# Patient Record
Sex: Female | Born: 1935 | Race: White | Hispanic: No | Marital: Single | Smoking: Current some day smoker
Health system: Southern US, Community
[De-identification: ages and names within clinical notes are randomized; demographics above are authoritative.]

## PROBLEM LIST (undated history)

## (undated) DIAGNOSIS — C801 Malignant (primary) neoplasm, unspecified: Secondary | ICD-10-CM

## (undated) DIAGNOSIS — I739 Peripheral vascular disease, unspecified: Secondary | ICD-10-CM

## (undated) DIAGNOSIS — I6529 Occlusion and stenosis of unspecified carotid artery: Secondary | ICD-10-CM

## (undated) DIAGNOSIS — F32A Depression, unspecified: Secondary | ICD-10-CM

## (undated) DIAGNOSIS — K219 Gastro-esophageal reflux disease without esophagitis: Secondary | ICD-10-CM

## (undated) DIAGNOSIS — G4700 Insomnia, unspecified: Secondary | ICD-10-CM

## (undated) DIAGNOSIS — K2270 Barrett's esophagus without dysplasia: Secondary | ICD-10-CM

## (undated) DIAGNOSIS — F17200 Nicotine dependence, unspecified, uncomplicated: Secondary | ICD-10-CM

## (undated) DIAGNOSIS — M25559 Pain in unspecified hip: Secondary | ICD-10-CM

## (undated) DIAGNOSIS — I2510 Atherosclerotic heart disease of native coronary artery without angina pectoris: Secondary | ICD-10-CM

## (undated) DIAGNOSIS — M545 Low back pain, unspecified: Secondary | ICD-10-CM

## (undated) DIAGNOSIS — I10 Essential (primary) hypertension: Secondary | ICD-10-CM

## (undated) DIAGNOSIS — Z8719 Personal history of other diseases of the digestive system: Secondary | ICD-10-CM

## (undated) DIAGNOSIS — E538 Deficiency of other specified B group vitamins: Secondary | ICD-10-CM

## (undated) DIAGNOSIS — E559 Vitamin D deficiency, unspecified: Secondary | ICD-10-CM

## (undated) DIAGNOSIS — I4891 Unspecified atrial fibrillation: Secondary | ICD-10-CM

## (undated) DIAGNOSIS — R059 Cough, unspecified: Secondary | ICD-10-CM

## (undated) DIAGNOSIS — IMO0001 Reserved for inherently not codable concepts without codable children: Secondary | ICD-10-CM

## (undated) DIAGNOSIS — M8580 Other specified disorders of bone density and structure, unspecified site: Secondary | ICD-10-CM

## (undated) DIAGNOSIS — E785 Hyperlipidemia, unspecified: Secondary | ICD-10-CM

## (undated) DIAGNOSIS — J449 Chronic obstructive pulmonary disease, unspecified: Secondary | ICD-10-CM

## (undated) DIAGNOSIS — F329 Major depressive disorder, single episode, unspecified: Secondary | ICD-10-CM

## (undated) DIAGNOSIS — F419 Anxiety disorder, unspecified: Secondary | ICD-10-CM

## (undated) DIAGNOSIS — I6509 Occlusion and stenosis of unspecified vertebral artery: Secondary | ICD-10-CM

## (undated) DIAGNOSIS — R05 Cough: Secondary | ICD-10-CM

## (undated) DIAGNOSIS — R011 Cardiac murmur, unspecified: Secondary | ICD-10-CM

## (undated) DIAGNOSIS — M797 Fibromyalgia: Secondary | ICD-10-CM

## (undated) DIAGNOSIS — M1990 Unspecified osteoarthritis, unspecified site: Secondary | ICD-10-CM

## (undated) DIAGNOSIS — N200 Calculus of kidney: Secondary | ICD-10-CM

## (undated) HISTORY — DX: Unspecified osteoarthritis, unspecified site: M19.90

## (undated) HISTORY — DX: Chronic obstructive pulmonary disease, unspecified: J44.9

## (undated) HISTORY — DX: Gastro-esophageal reflux disease without esophagitis: K21.9

## (undated) HISTORY — DX: Atherosclerotic heart disease of native coronary artery without angina pectoris: I25.10

## (undated) HISTORY — DX: Cough, unspecified: R05.9

## (undated) HISTORY — DX: Vitamin D deficiency, unspecified: E55.9

## (undated) HISTORY — DX: Pain in unspecified hip: M25.559

## (undated) HISTORY — DX: Malignant (primary) neoplasm, unspecified: C80.1

## (undated) HISTORY — DX: Cough: R05

## (undated) HISTORY — DX: Deficiency of other specified B group vitamins: E53.8

## (undated) HISTORY — DX: Nicotine dependence, unspecified, uncomplicated: F17.200

## (undated) HISTORY — DX: Cardiac murmur, unspecified: R01.1

## (undated) HISTORY — DX: Other specified disorders of bone density and structure, unspecified site: M85.80

## (undated) HISTORY — DX: Occlusion and stenosis of unspecified vertebral artery: I65.09

## (undated) HISTORY — DX: Unspecified atrial fibrillation: I48.91

## (undated) HISTORY — DX: Occlusion and stenosis of unspecified carotid artery: I65.29

## (undated) HISTORY — DX: Low back pain: M54.5

## (undated) HISTORY — PX: COLONOSCOPY: SHX174

## (undated) HISTORY — DX: Peripheral vascular disease, unspecified: I73.9

## (undated) HISTORY — DX: Reserved for inherently not codable concepts without codable children: IMO0001

## (undated) HISTORY — DX: Low back pain, unspecified: M54.50

## (undated) HISTORY — DX: Insomnia, unspecified: G47.00

## (undated) HISTORY — DX: Barrett's esophagus without dysplasia: K22.70

## (undated) HISTORY — DX: Hyperlipidemia, unspecified: E78.5

## (undated) HISTORY — DX: Essential (primary) hypertension: I10

---

## 1984-05-17 HISTORY — PX: ABDOMINAL HYSTERECTOMY: SHX81

## 1997-08-20 ENCOUNTER — Ambulatory Visit (HOSPITAL_COMMUNITY): Admission: RE | Admit: 1997-08-20 | Discharge: 1997-08-20 | Payer: Self-pay | Admitting: Internal Medicine

## 1999-04-03 ENCOUNTER — Other Ambulatory Visit: Admission: RE | Admit: 1999-04-03 | Discharge: 1999-04-03 | Payer: Self-pay | Admitting: *Deleted

## 1999-08-11 ENCOUNTER — Ambulatory Visit (HOSPITAL_BASED_OUTPATIENT_CLINIC_OR_DEPARTMENT_OTHER): Admission: RE | Admit: 1999-08-11 | Discharge: 1999-08-11 | Payer: Self-pay | Admitting: *Deleted

## 1999-08-11 ENCOUNTER — Encounter (INDEPENDENT_AMBULATORY_CARE_PROVIDER_SITE_OTHER): Payer: Self-pay | Admitting: *Deleted

## 2000-09-20 ENCOUNTER — Other Ambulatory Visit: Admission: RE | Admit: 2000-09-20 | Discharge: 2000-09-20 | Payer: Self-pay | Admitting: Obstetrics & Gynecology

## 2002-10-09 ENCOUNTER — Other Ambulatory Visit: Admission: RE | Admit: 2002-10-09 | Discharge: 2002-10-09 | Payer: Self-pay | Admitting: Obstetrics & Gynecology

## 2002-10-21 ENCOUNTER — Encounter: Payer: Self-pay | Admitting: Orthopaedic Surgery

## 2002-10-21 ENCOUNTER — Encounter: Admission: RE | Admit: 2002-10-21 | Discharge: 2002-10-21 | Payer: Self-pay | Admitting: Orthopaedic Surgery

## 2006-04-04 ENCOUNTER — Ambulatory Visit: Payer: Self-pay | Admitting: Gastroenterology

## 2006-04-05 ENCOUNTER — Ambulatory Visit: Payer: Self-pay | Admitting: Internal Medicine

## 2006-04-20 ENCOUNTER — Encounter (INDEPENDENT_AMBULATORY_CARE_PROVIDER_SITE_OTHER): Payer: Self-pay | Admitting: *Deleted

## 2006-04-20 ENCOUNTER — Ambulatory Visit: Payer: Self-pay | Admitting: Gastroenterology

## 2006-05-13 ENCOUNTER — Ambulatory Visit: Payer: Self-pay | Admitting: Internal Medicine

## 2006-08-25 ENCOUNTER — Ambulatory Visit: Payer: Self-pay | Admitting: Internal Medicine

## 2006-09-20 ENCOUNTER — Ambulatory Visit: Payer: Self-pay | Admitting: Internal Medicine

## 2006-09-30 ENCOUNTER — Ambulatory Visit: Payer: Self-pay | Admitting: Internal Medicine

## 2006-09-30 LAB — CONVERTED CEMR LAB
Basophils Relative: 0.2 % (ref 0.0–1.0)
Bilirubin Urine: NEGATIVE
Bilirubin, Direct: 0.1 mg/dL (ref 0.0–0.3)
CO2: 29 meq/L (ref 19–32)
Direct LDL: 138.1 mg/dL
Eosinophils Relative: 1.3 % (ref 0.0–5.0)
GFR calc Af Amer: 80 mL/min
Glucose, Bld: 108 mg/dL — ABNORMAL HIGH (ref 70–99)
HDL: 40.5 mg/dL (ref >39.0)
Hemoglobin: 14 g/dL (ref 12.0–15.0)
Leukocytes, UA: NEGATIVE
Lymphocytes Relative: 23.3 % (ref 12.0–46.0)
Monocytes Absolute: 0.6 10*3/uL (ref 0.2–0.7)
Neutro Abs: 4.4 10*3/uL (ref 1.4–7.7)
Neutrophils Relative %: 66.7 % (ref 43.0–77.0)
Nitrite: NEGATIVE
Potassium: 4.6 meq/L (ref 3.5–5.1)
Specific Gravity, Urine: 1.02 (ref 1.000–1.03)
Total Protein, Urine: NEGATIVE mg/dL
Total Protein: 6.9 g/dL (ref 6.0–8.3)
Vitamin B-12: 198 pg/mL — ABNORMAL LOW (ref 211–911)
WBC: 6.7 10*3/uL (ref 4.5–10.5)

## 2006-10-05 ENCOUNTER — Ambulatory Visit: Payer: Self-pay | Admitting: Cardiology

## 2006-10-31 ENCOUNTER — Ambulatory Visit: Payer: Self-pay | Admitting: Internal Medicine

## 2006-12-09 ENCOUNTER — Ambulatory Visit: Payer: Self-pay | Admitting: Cardiovascular Disease

## 2006-12-23 ENCOUNTER — Encounter: Payer: Self-pay | Admitting: Cardiovascular Disease

## 2006-12-23 ENCOUNTER — Ambulatory Visit: Payer: Self-pay

## 2007-01-05 ENCOUNTER — Ambulatory Visit: Payer: Self-pay | Admitting: Internal Medicine

## 2007-01-05 ENCOUNTER — Encounter: Payer: Self-pay | Admitting: Internal Medicine

## 2007-01-05 DIAGNOSIS — M545 Low back pain: Secondary | ICD-10-CM

## 2007-01-05 DIAGNOSIS — Z720 Tobacco use: Secondary | ICD-10-CM

## 2007-01-05 DIAGNOSIS — M949 Disorder of cartilage, unspecified: Secondary | ICD-10-CM

## 2007-01-05 DIAGNOSIS — M899 Disorder of bone, unspecified: Secondary | ICD-10-CM

## 2007-01-05 DIAGNOSIS — F17210 Nicotine dependence, cigarettes, uncomplicated: Secondary | ICD-10-CM | POA: Insufficient documentation

## 2007-01-05 DIAGNOSIS — E538 Deficiency of other specified B group vitamins: Secondary | ICD-10-CM | POA: Insufficient documentation

## 2007-01-05 DIAGNOSIS — K2270 Barrett's esophagus without dysplasia: Secondary | ICD-10-CM

## 2007-01-05 DIAGNOSIS — I2510 Atherosclerotic heart disease of native coronary artery without angina pectoris: Secondary | ICD-10-CM | POA: Insufficient documentation

## 2007-01-05 DIAGNOSIS — I6509 Occlusion and stenosis of unspecified vertebral artery: Secondary | ICD-10-CM | POA: Insufficient documentation

## 2007-01-05 DIAGNOSIS — E559 Vitamin D deficiency, unspecified: Secondary | ICD-10-CM

## 2007-01-05 DIAGNOSIS — E785 Hyperlipidemia, unspecified: Secondary | ICD-10-CM

## 2007-01-05 DIAGNOSIS — M1990 Unspecified osteoarthritis, unspecified site: Secondary | ICD-10-CM

## 2007-04-10 ENCOUNTER — Ambulatory Visit: Payer: Self-pay | Admitting: Internal Medicine

## 2007-04-10 DIAGNOSIS — I739 Peripheral vascular disease, unspecified: Secondary | ICD-10-CM

## 2007-04-10 DIAGNOSIS — K219 Gastro-esophageal reflux disease without esophagitis: Secondary | ICD-10-CM

## 2007-07-24 ENCOUNTER — Encounter: Payer: Self-pay | Admitting: Internal Medicine

## 2007-12-12 ENCOUNTER — Encounter: Payer: Self-pay | Admitting: Internal Medicine

## 2007-12-13 ENCOUNTER — Ambulatory Visit: Payer: Self-pay | Admitting: Internal Medicine

## 2007-12-13 DIAGNOSIS — I10 Essential (primary) hypertension: Secondary | ICD-10-CM | POA: Insufficient documentation

## 2007-12-13 DIAGNOSIS — IMO0001 Reserved for inherently not codable concepts without codable children: Secondary | ICD-10-CM

## 2008-01-04 ENCOUNTER — Ambulatory Visit: Payer: Self-pay

## 2008-01-04 ENCOUNTER — Ambulatory Visit: Payer: Self-pay | Admitting: Cardiovascular Disease

## 2008-03-12 ENCOUNTER — Ambulatory Visit: Payer: Self-pay | Admitting: Internal Medicine

## 2008-03-12 LAB — CONVERTED CEMR LAB
ALT: 14 units/L (ref 0–35)
Bilirubin, Direct: 0.1 mg/dL (ref 0.0–0.3)
CO2: 30 meq/L (ref 19–32)
Calcium: 9 mg/dL (ref 8.4–10.5)
Chloride: 105 meq/L (ref 96–112)
GFR calc non Af Amer: 58 mL/min
Sodium: 143 meq/L (ref 135–145)
TSH: 2.71 microintl units/mL (ref 0.35–5.50)
Total Bilirubin: 0.5 mg/dL (ref 0.3–1.2)
Total CHOL/HDL Ratio: 5.5
Triglycerides: 253 mg/dL (ref 0–149)

## 2008-03-17 ENCOUNTER — Encounter: Payer: Self-pay | Admitting: Internal Medicine

## 2008-03-18 ENCOUNTER — Ambulatory Visit: Payer: Self-pay | Admitting: Internal Medicine

## 2008-03-18 DIAGNOSIS — R05 Cough: Secondary | ICD-10-CM

## 2008-03-20 ENCOUNTER — Telehealth: Payer: Self-pay | Admitting: Internal Medicine

## 2008-07-11 ENCOUNTER — Ambulatory Visit: Payer: Self-pay | Admitting: Internal Medicine

## 2008-07-11 LAB — CONVERTED CEMR LAB
ALT: 15 U/L
AST: 21 U/L
Albumin: 3.7 g/dL
Alkaline Phosphatase: 74 U/L
BUN: 15 mg/dL
Bilirubin, Direct: 0.1 mg/dL
CO2: 30 meq/L
Calcium: 9.4 mg/dL
Chloride: 104 meq/L
Cholesterol: 233 mg/dL
Creatinine, Ser: 1.1 mg/dL
Direct LDL: 152.7 mg/dL
GFR calc Af Amer: 63 mL/min
GFR calc non Af Amer: 52 mL/min
Glucose, Bld: 117 mg/dL — ABNORMAL HIGH
HDL: 43.7 mg/dL
Potassium: 4.2 meq/L
Sodium: 141 meq/L
Total Bilirubin: 0.7 mg/dL
Total CHOL/HDL Ratio: 5.3
Total Protein: 6.6 g/dL
Triglycerides: 200 mg/dL — ABNORMAL HIGH
VLDL: 40 mg/dL

## 2008-07-16 ENCOUNTER — Ambulatory Visit: Payer: Self-pay | Admitting: Internal Medicine

## 2008-10-22 ENCOUNTER — Ambulatory Visit: Payer: Self-pay | Admitting: Internal Medicine

## 2008-10-22 LAB — CONVERTED CEMR LAB
Albumin: 3.8 g/dL (ref 3.5–5.2)
Chloride: 112 meq/L (ref 96–112)
Cholesterol: 250 mg/dL — ABNORMAL HIGH (ref 0–200)
Direct LDL: 182.6 mg/dL
GFR calc non Af Amer: 65.37 mL/min (ref >60)
Potassium: 3.9 meq/L (ref 3.5–5.1)
Sodium: 145 meq/L (ref 135–145)
TSH: 1.6 microintl units/mL (ref 0.35–5.50)
Total CHOL/HDL Ratio: 6
Total Protein: 6.9 g/dL (ref 6.0–8.3)
Triglycerides: 195 mg/dL — ABNORMAL HIGH (ref 0.0–149.0)
VLDL: 39 mg/dL (ref 0.0–40.0)
Vit D, 25-Hydroxy: 31 ng/mL (ref 30–89)

## 2008-10-28 ENCOUNTER — Ambulatory Visit: Payer: Self-pay | Admitting: Internal Medicine

## 2008-10-28 DIAGNOSIS — M25559 Pain in unspecified hip: Secondary | ICD-10-CM

## 2009-01-09 ENCOUNTER — Encounter: Payer: Self-pay | Admitting: Cardiovascular Disease

## 2009-01-09 ENCOUNTER — Ambulatory Visit: Payer: Self-pay

## 2009-02-01 DIAGNOSIS — R011 Cardiac murmur, unspecified: Secondary | ICD-10-CM | POA: Insufficient documentation

## 2009-02-01 DIAGNOSIS — Z9189 Other specified personal risk factors, not elsewhere classified: Secondary | ICD-10-CM | POA: Insufficient documentation

## 2009-02-03 ENCOUNTER — Ambulatory Visit: Payer: Self-pay | Admitting: Internal Medicine

## 2009-02-04 ENCOUNTER — Ambulatory Visit: Payer: Self-pay | Admitting: Cardiovascular Disease

## 2009-02-04 DIAGNOSIS — I4891 Unspecified atrial fibrillation: Secondary | ICD-10-CM | POA: Insufficient documentation

## 2009-02-04 DIAGNOSIS — I6529 Occlusion and stenosis of unspecified carotid artery: Secondary | ICD-10-CM | POA: Insufficient documentation

## 2009-04-28 ENCOUNTER — Ambulatory Visit: Payer: Self-pay | Admitting: Internal Medicine

## 2009-04-29 LAB — CONVERTED CEMR LAB
ALT: 16 units/L (ref 0–35)
BUN: 9 mg/dL (ref 6–23)
Bilirubin, Direct: 0.1 mg/dL (ref 0.0–0.3)
CO2: 30 meq/L (ref 19–32)
Calcium: 9.5 mg/dL (ref 8.4–10.5)
Chloride: 107 meq/L (ref 96–112)
Cholesterol: 292 mg/dL — ABNORMAL HIGH (ref 0–200)
Creatinine, Ser: 1.1 mg/dL (ref 0.4–1.2)
TSH: 2.62 microintl units/mL (ref 0.35–5.50)
Total Bilirubin: 0.7 mg/dL (ref 0.3–1.2)
Total CHOL/HDL Ratio: 7
Total Protein: 7.7 g/dL (ref 6.0–8.3)
Triglycerides: 465 mg/dL — ABNORMAL HIGH (ref 0.0–149.0)

## 2009-05-17 HISTORY — PX: OTHER SURGICAL HISTORY: SHX169

## 2009-05-19 ENCOUNTER — Ambulatory Visit: Payer: Self-pay | Admitting: Internal Medicine

## 2009-05-19 DIAGNOSIS — G4700 Insomnia, unspecified: Secondary | ICD-10-CM | POA: Insufficient documentation

## 2009-05-23 ENCOUNTER — Telehealth: Payer: Self-pay | Admitting: Internal Medicine

## 2009-08-14 ENCOUNTER — Ambulatory Visit: Payer: Self-pay | Admitting: Internal Medicine

## 2009-08-14 LAB — CONVERTED CEMR LAB
ALT: 15 units/L (ref 0–35)
AST: 23 units/L (ref 0–37)
Albumin: 3.9 g/dL (ref 3.5–5.2)
BUN: 10 mg/dL (ref 6–23)
Chloride: 105 meq/L (ref 96–112)
Cholesterol: 194 mg/dL (ref 0–200)
Creatinine, Ser: 1 mg/dL (ref 0.4–1.2)
Glucose, Bld: 108 mg/dL — ABNORMAL HIGH (ref 70–99)
Potassium: 4.2 meq/L (ref 3.5–5.1)
Total CHOL/HDL Ratio: 4
Total Protein: 7.2 g/dL (ref 6.0–8.3)
Triglycerides: 242 mg/dL — ABNORMAL HIGH (ref 0.0–149.0)

## 2009-08-19 ENCOUNTER — Ambulatory Visit: Payer: Self-pay | Admitting: Internal Medicine

## 2010-03-25 ENCOUNTER — Ambulatory Visit: Payer: Self-pay | Admitting: Cardiovascular Disease

## 2010-03-25 ENCOUNTER — Ambulatory Visit: Payer: Self-pay | Admitting: Internal Medicine

## 2010-03-27 LAB — CONVERTED CEMR LAB
AST: 25 units/L (ref 0–37)
Albumin: 3.8 g/dL (ref 3.5–5.2)
BUN: 19 mg/dL (ref 6–23)
Basophils Absolute: 0 10*3/uL (ref 0.0–0.1)
CO2: 28 meq/L (ref 19–32)
Calcium: 9.2 mg/dL (ref 8.4–10.5)
Direct LDL: 159.2 mg/dL
Eosinophils Absolute: 0.1 10*3/uL (ref 0.0–0.7)
GFR calc non Af Amer: 59.72 mL/min (ref >60)
Glucose, Bld: 108 mg/dL — ABNORMAL HIGH (ref 70–99)
HCT: 41.9 % (ref 36.0–46.0)
HDL: 51.1 mg/dL (ref >39.00)
Hemoglobin, Urine: NEGATIVE
Hemoglobin: 14.1 g/dL (ref 12.0–15.0)
Lymphocytes Relative: 22.3 % (ref 12.0–46.0)
Lymphs Abs: 1.8 10*3/uL (ref 0.7–4.0)
MCHC: 33.8 g/dL (ref 30.0–36.0)
Monocytes Relative: 8.4 % (ref 3.0–12.0)
Neutro Abs: 5.4 10*3/uL (ref 1.4–7.7)
Platelets: 216 10*3/uL (ref 150.0–400.0)
RDW: 15.1 % — ABNORMAL HIGH (ref 11.5–14.6)
TSH: 2.52 microintl units/mL (ref 0.35–5.50)
Total Bilirubin: 0.4 mg/dL (ref 0.3–1.2)
Triglycerides: 232 mg/dL — ABNORMAL HIGH (ref 0.0–149.0)
Urine Glucose: NEGATIVE mg/dL
Urobilinogen, UA: 1 (ref 0.0–1.0)
VLDL: 46.4 mg/dL — ABNORMAL HIGH (ref 0.0–40.0)

## 2010-03-30 ENCOUNTER — Ambulatory Visit: Payer: Self-pay | Admitting: Internal Medicine

## 2010-05-17 HISTORY — PX: JOINT REPLACEMENT: SHX530

## 2010-06-16 NOTE — Assessment & Plan Note (Signed)
Summary: 6 MO ROV /NWS  #   Vital Signs:  Patient profile:   76 year old female Height:      64 inches Weight:      182 pounds BMI:     31.35 Temp:     98.4 degrees F oral Pulse rate:   76 / minute Pulse rhythm:   regular Resp:     16 per minute BP sitting:   136 / 78  (left arm) Cuff size:   regular  Vitals Entered By: Lanier Prude, CMA(AAMA) (March 30, 2010 8:07 AM) CC: 6 mo f/u Is Patient Diabetic? No Comments pt is having cataract surgery on Rt eye Monday 04-06-10   CC:  6 mo f/u.  History of Present Illness: The patient presents for a follow up of CAD, PVD, COPD, hypertension, hyperlipidemia   Preventive Screening-Counseling & Management  Caffeine-Diet-Exercise     Does Patient Exercise: yes  Current Medications (verified): 1)  Aspirin 81 Mg Tbec (Aspirin) .... Take One Tablet By Mouth Daily 2)  Vitamin B-12 1000 Mcg  Tabs (Cyanocobalamin) .... 1/2 Once Daily 3)  Vitamin D3 1000 Unit  Tabs (Cholecalciferol) .[name] Kitchen.. 1 Tab By Mouth Once Daily 4)  Amlodipine Besylate 5 Mg  Tabs (Amlodipine Besylate) .[name] Kitchen.. 1 By Mouth Every Day 5)  Zolpidem Tartrate 10 Mg Tabs (Zolpidem Tartrate) .... 1/2-1 Tab At Bedtime As Needed Insomnia 6)  Oscal 500/200 D-3 500-200 Mg-Unit Tabs (Calcium Carbonate-Vitamin D) .[name] Kitchen.. 1  Tab By Mouth Once Daily 7)  Bromday 0.09 % (Daily) Soln (Bromfenac Sodium) .... As  Directed 8)  Prednisolone Acetate 1 % Susp (Prednisolone Acetate) .... As Diretced 9)  Besivance 0.6 % Susp (Besifloxacin Hcl) .... As Directed  Allergies (verified): 1)  ! Pcn 2)  ! 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    Physical Exam  General:  overweight-appearing.   Nose:  External nasal examination shows no deformity or inflammation. Nasal mucosa are pink and moist without lesions or exudates. Mouth:  Oral mucosa and oropharynx without lesions or exudates.  Teeth in good repair. Abdomen:  Bowel sounds positive,abdomen soft and non-tender without masses, organomegaly or hernias noted. Msk:  Lumbar-sacral spine is less tender to palpation over paraspinal muscles and painfull with the ROM R troch major is  not tender Neurologic:  No cranial nerve deficits noted. Station and gait are normal. Plantar reflexes are down-going bilaterally. DTRs are symmetrical throughout. Sensory, motor and coordinative functions appear intact. Skin:  Intact without suspicious lesions or rashes Psych:  Cognition and judgment appear intact. Alert and cooperative with normal attention span and concentration. No apparent delusions, illusions, hallucinations   Impression & Recommendations:  Problem # 1:  HYPERTENSION (ICD-401.9) Assessment Unchanged  Her updated medication list for this problem includes:    Amlodipine Besylate 5 Mg Tabs (Amlodipine besylate) .[name] Kitchen... 1 by mouth every day  BP today:  136/78 Prior BP: 130/80 (03/25/2010)  Labs Reviewed: K+: 3.8 (03/25/2010) Creat: : 1.0 (03/25/2010)   Chol: 252 (03/25/2010)   HDL: 51.10 (03/25/2010)   LDL: DEL (07/11/2008)   TG: 232.0 (03/25/2010)  Problem # 2:  PERIPHERAL VASCULAR DISEASE (ICD-443.9) Assessment: Unchanged On the regimen of medicine(s) reflected in the chart    Problem # 3:  HYPERLIPIDEMIA (ICD-272.4) Assessment: Unchanged  Statin intolerant  Labs Reviewed: SGOT: 25 (03/25/2010)   SGPT: 17 (03/25/2010)   HDL:51.10 (03/25/2010), 52.30 (08/14/2009)  LDL:DEL (07/11/2008), DEL (03/12/2008)  Chol:252 (03/25/2010), 194 (08/14/2009)  Trig:232.0 (03/25/2010), 242.0 (08/14/2009)  Problem # 4:  VITAMIN B12 DEFICIENCY (ICD-266.2) Assessment: Comment Only On the regimen of medicine(s) reflected in the chart    Complete Medication List: 1)  Aspirin 81 Mg Tbec (Aspirin) .... Take one tablet by mouth daily 2)  Vitamin B-12 1000 Mcg Tabs (Cyanocobalamin) .... 1/2 once daily 3)  Vitamin D3 1000 Unit Tabs (Cholecalciferol) .[name] Kitchen.. 1 tab by mouth once daily 4)  Amlodipine Besylate 5 Mg Tabs (Amlodipine besylate) .[name] Kitchen.. 1 by mouth every day 5)  Zolpidem Tartrate 10 Mg Tabs (Zolpidem tartrate) .... 1/2-1 tab at bedtime as needed insomnia 6)  Oscal 500/200 D-3 500-200 Mg-unit Tabs (Calcium carbonate-vitamin d) .[name] Kitchen.. 1  tab by mouth once daily 7)  Bromday 0.09 % (daily) Soln (Bromfenac sodium) .... As  directed 8)  Prednisolone Acetate 1 % Susp (Prednisolone acetate) .... As diretced 9)  Besivance 0.6 % Susp (Besifloxacin hcl) .... As directed  Patient Instructions: 1)  Please schedule a follow-up appointment in 4 months well w/labs. Prescriptions: AMLODIPINE BESYLATE 5 MG  TABS (AMLODIPINE BESYLATE) 1 by mouth every day  #30 x 12   Entered and Authorized by:   Tresa Garter MD   Signed by:   Tresa Garter MD on 03/30/2010   Method used:   Electronically to        [facility]* (retail)       [address]       [place], [place]  [id number]       Ph: [phone]       Fax: [phone]   RxID:   [id number]    Orders Added: 1)  Est. Patient Level IV [25366]   Immunization History:  Influenza Immunization History:    Influenza:  historical (03/06/2010)   Immunization History:  Influenza Immunization History:    Influenza:  Historical (03/06/2010)

## 2010-06-16 NOTE — Assessment & Plan Note (Signed)
Summary: 3 MOS F/U # CD   Vital Signs:  Patient profile:   76 year old female Weight:      189 pounds Temp:     98.1 degrees F oral Pulse rate:   75 / minute BP sitting:   134 / 86  (left arm)  Vitals Entered By: Tora Perches (May 19, 2009 3:28 PM) CC: f/u Is Patient Diabetic? No   CC:  f/u.  History of Present Illness: The patient presents for a follow up of hypertension, OA, hyperlipidemia. C/o insomnia   Current Medications (verified): 1)  Cyclobenzaprine Hcl 5 Mg Tabs (Cyclobenzaprine Hcl) .[name] Kitchen.. 1 By Mouth Three Times A Day Prn 2)  Vesicare 10 Mg  Tabs (Solifenacin Succinate) .... Once Daily  As Needed 3)  Potassium Chloride Cr 10 Meq  Tbcr (Potassium Chloride) .[name] Kitchen.. 1 By Mouth Qd 4)  Aspirin 81 Mg Tbec (Aspirin) .... Take One Tablet By Mouth Daily 5)  Vitamin B-12 1000 Mcg  Tabs (Cyanocobalamin) .... 1/2 Once Daily 6)  Vitamin D3 1000 Unit  Tabs (Cholecalciferol) .[name] Kitchen.. 1 Tab By Mouth Once Daily 7)  Amlodipine Besylate 5 Mg  Tabs (Amlodipine Besylate) .[name] Kitchen.. 1 By Mouth Every Day 8)  Vitamin C 1000 Mg Tabs (Ascorbic Acid) .... Tab By Mouth Once Daily  Allergies: 1)  ! Pcn 2)  ! 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  The patient denies chest pain, syncope, and dyspnea on exertion.    Physical Exam  General:  overweight-appearing.   Nose:  External nasal examination shows no deformity or inflammation. Nasal mucosa are pink and moist without lesions or exudates. Mouth:  Oral mucosa and oropharynx without lesions or exudates.  Teeth in good repair. Lungs:  Normal respiratory effort, chest expands symmetrically. Lungs are clear to auscultation, no crackles or wheezes. Heart:  Normal rate and regular rhythm. S1 and S2 normal without gallop, murmur, click, rub or other extra sounds. Abdomen:  Bowel sounds positive,abdomen soft and non-tender without masses, organomegaly or hernias noted. Msk:  Lumbar-sacral spine is less tender to palpation over paraspinal muscles and painfull with the ROM R troch major is  not tender Neurologic:  No cranial nerve deficits noted. Station and gait are normal. Plantar reflexes are down-going bilaterally. DTRs are symmetrical throughout. Sensory, motor and coordinative functions appear intact. Skin:  Intact without suspicious lesions or rashes Psych:  Cognition and judgment appear intact. Alert and cooperative with normal attention span and concentration. No apparent delusions, illusions, hallucinations   Impression & Recommendations:  Problem # 1:  HYPERTENSION (ICD-401.9) Assessment Unchanged  Her updated medication list for this problem includes:    Amlodipine Besylate 5 Mg Tabs (Amlodipine besylate) .[name] Kitchen... 1 by mouth every day  Problem # 2:  VITAMIN D DEFICIENCY (ICD-268.9) Assessment: Unchanged  Problem # 3:  HYPERLIPIDEMIA (ICD-272.4) Assessment: Deteriorated  Her updated medication list for this problem includes:    Crestor 10 Mg Tabs (Rosuvastatin calcium) .[name] Kitchen... 1 by mouth once daily for cholesterol  Problem #  4:  LOW BACK PAIN (ICD-724.2) Assessment: Unchanged  Her updated medication list for this problem includes:    Cyclobenzaprine Hcl 5 Mg Tabs (Cyclobenzaprine hcl) .[name] Kitchen... 1 by mouth three times a day prn    Aspirin 81 Mg Tbec (Aspirin) .[name] Kitchen... Take one tablet by mouth daily  Problem # 5:  CORONARY ARTERY DISEASE (ICD-414.00) Assessment: Unchanged  Her updated medication list for this problem includes:    Aspirin 81 Mg Tbec (Aspirin) .[name] Kitchen... Take one tablet by mouth daily    Amlodipine Besylate 5 Mg Tabs (Amlodipine besylate) .[name] Kitchen... 1 by mouth every day  Problem # 6:  INSOMNIA, CHRONIC (ICD-307.42) Assessment: Deteriorated Zolpidem prn  Complete Medication List: 1)  Cyclobenzaprine Hcl 5 Mg Tabs (Cyclobenzaprine hcl) .[name] Kitchen.. 1 by mouth three times a day prn 2)  Vesicare 10 Mg Tabs (Solifenacin succinate) .... Once daily  as needed 3)  Potassium Chloride Cr 10 Meq Tbcr (Potassium chloride) .[name] Kitchen.. 1 by mouth qd 4)  Aspirin 81 Mg Tbec (Aspirin) .... Take one tablet by mouth daily 5)  Vitamin B-12 1000 Mcg Tabs (Cyanocobalamin) .... 1/2 once daily 6)  Vitamin D3 1000 Unit Tabs (Cholecalciferol) .[name] Kitchen.. 1 tab by mouth once daily 7)  Amlodipine Besylate 5 Mg Tabs (Amlodipine besylate) .[name] Kitchen.. 1 by mouth every day 8)  Zolpidem Tartrate 10 Mg Tabs (Zolpidem tartrate) .... 1/2-1 tab at bedtime as needed insomnia 9)  Crestor 10 Mg Tabs (Rosuvastatin calcium) .[name] Kitchen.. 1 by mouth once daily for cholesterol  Other Orders: Flu Vaccine 78yrs + (04540) Administration Flu vaccine - MCR ([id number])  Patient Instructions: 1)  Try Crestor 1 every other day  2)  BMP prior to visit, ICD-9: 3)  Hepatic Panel prior to visit, ICD-9:272.0 4)  Vit B12 266.8 5)  Lipid Panel prior to visit, ICD-9: 6)  Please schedule a follow-up appointment in 3 months. Prescriptions: CRESTOR 10 MG TABS (ROSUVASTATIN CALCIUM) 1 by mouth once daily for cholesterol  #30 x 12   Entered and Authorized by:   Tresa Garter MD   Signed by:    Tresa Garter MD on 05/19/2009   Method used:   Print then Give to Patient   RxID:   [id number] ZOLPIDEM TARTRATE 10 MG TABS (ZOLPIDEM TARTRATE) 1/2-1 tab at bedtime as needed insomnia  #30 x 6   Entered and Authorized by:   Tresa Garter MD   Signed by:   Tresa Garter MD on 05/19/2009   Method used:   Print then Give to Patient   RxID:   [id number]    Influenza Vaccine (to be given today)    Flu Vaccine Consent Questions     Do you have a history of severe allergic reactions to this vaccine? no    Any prior history of allergic reactions to egg and/or gelatin? no    Do you have a sensitivity to the preservative Thimersol? no    Do you have a past history of Guillan-Barre Syndrome? no    Do you currently have an acute febrile illness? no    Have you ever had a severe reaction to latex? no    Vaccine information given and explained to patient? yes    Are you currently pregnant? no    Lot Number:[id number]   Exp Date:11/13/2009   Site  Given  right Deltoid IMdflu

## 2010-06-16 NOTE — Assessment & Plan Note (Signed)
Summary: 3 MO ROV /NWS  #   Vital Signs:  Patient profile:   76 year old female Height:      64 inches Weight:      191.50 pounds BMI:     32.99 O2 Sat:      95 % on Room air Temp:     97.1 degrees F oral Pulse rate:   76 / minute BP sitting:   150 / 78  (left arm) Cuff size:   regular  Vitals Entered By: Lucious Groves (August 19, 2009 1:42 PM)  O2 Flow:  Room air CC: 3 mo. rtn ov--Pt states that she still has random buzzing in her ears. Per the pt this started long time ago--possibly 2 yrs ago./kb   CC:  3 mo. rtn ov--Pt states that she still has random buzzing in her ears. Per the pt this started long time ago--possibly 2 yrs ago./kb.  History of Present Illness: The patient presents for a follow up of hypertension, CAD, PVD, hyperlipidemia She stopped Crestor due to ear buzzing - it helped; then she restarted Crestor every other day    Current Medications (verified): 1)  Cyclobenzaprine Hcl 5 Mg Tabs (Cyclobenzaprine Hcl) .[name] Kitchen.. 1 By Mouth Three Times A Day Prn 2)  Vesicare 10 Mg  Tabs (Solifenacin Succinate) .... Once Daily  As Needed 3)  Potassium Chloride Cr 10 Meq  Tbcr (Potassium Chloride) .[name] Kitchen.. 1 By Mouth Qd 4)  Aspirin 81 Mg Tbec (Aspirin) .... Take One Tablet By Mouth Daily 5)  Vitamin B-12 1000 Mcg  Tabs (Cyanocobalamin) .... 1/2 Once Daily 6)  Vitamin D3 1000 Unit  Tabs (Cholecalciferol) .[name] Kitchen.. 1 Tab By Mouth Once Daily 7)  Amlodipine Besylate 5 Mg  Tabs (Amlodipine Besylate) .[name] Kitchen.. 1 By Mouth Every Day 8)  Zolpidem Tartrate 10 Mg Tabs (Zolpidem Tartrate) .... 1/2-1 Tab At Bedtime As Needed Insomnia 9)  Crestor 10 Mg Tabs (Rosuvastatin Calcium) .[name] Kitchen.. 1 By Mouth Once Daily For Cholesterol  Allergies (verified): 1)  ! Pcn 2)  ! 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  The patient denies fever, chest pain, syncope, dyspnea on exertion, and abdominal pain.         Buzzing in ears  Physical Exam  General:  overweight-appearing.   Nose:  External nasal examination shows no deformity or inflammation. Nasal mucosa are pink and moist without lesions or exudates. Mouth:  Oral mucosa and oropharynx without lesions or exudates.  Teeth in good repair. Lungs:  Normal respiratory effort, chest expands symmetrically. Lungs are clear to auscultation, no crackles or wheezes. Heart:  Normal rate and regular rhythm. S1 and S2 normal without gallop, murmur, click, rub or other extra sounds. Abdomen:  Bowel sounds positive,abdomen soft and non-tender without masses, organomegaly or hernias  noted. Msk:  Lumbar-sacral spine is less tender to palpation over paraspinal muscles and painfull with the ROM R troch major is  not tender Neurologic:  No cranial nerve deficits noted. Station and gait are normal. Plantar reflexes are down-going bilaterally. DTRs are symmetrical throughout. Sensory, motor and coordinative functions appear intact. Skin:  Intact without suspicious lesions or rashes Psych:  Cognition and judgment appear intact. Alert and cooperative  with normal attention span and concentration. No apparent delusions, illusions, hallucinations   Impression & Recommendations:  Problem # 1:  HYPERTENSION (ICD-401.9) Assessment Deteriorated  Her updated medication list for this problem includes:    Amlodipine Besylate 5 Mg Tabs (Amlodipine besylate) .[name] Kitchen... 1 by mouth every day  Problem # 2:  MYALGIA (ICD-729.1) Assessment: Improved  Her updated medication list for this problem includes:    Cyclobenzaprine Hcl 5 Mg Tabs (Cyclobenzaprine hcl) .[name] Kitchen... 1 by mouth three times a day prn    Aspirin 81 Mg Tbec (Aspirin) .[name] Kitchen... Take one tablet by mouth daily  Problem # 3:  HYPERLIPIDEMIA (ICD-272.4) Assessment: Improved  Her updated medication list for this problem includes:    Crestor 10 Mg Tabs (Rosuvastatin calcium) .[name] Kitchen... 1 by mouth once daily for cholesterol  Labs Reviewed: SGOT: 23 (08/14/2009)   SGPT: 15 (08/14/2009)   HDL:52.30 (08/14/2009), 40.40 (04/28/2009)  LDL:DEL (07/11/2008), DEL (03/12/2008)  Chol:194 (08/14/2009), 292 (04/28/2009)  Trig:242.0 (08/14/2009), 465.0 (04/28/2009) - the best we can do!  Problem # 4:  CAROTID ARTERY DISEASE (ICD-433.10) Assessment: Unchanged  Her updated medication list for this problem includes:    Aspirin 81 Mg Tbec (Aspirin) .[name] Kitchen... Take one tablet by mouth daily  Problem # 5:  TOBACCO USER (ICD-305.1) Assessment: Unchanged  Encouraged smoking cessation and discussed different methods for smoking cessation.   Complete Medication List: 1)  Cyclobenzaprine Hcl 5 Mg Tabs (Cyclobenzaprine hcl) .[name] Kitchen.. 1 by mouth three times a day prn 2)  Vesicare 10 Mg Tabs (Solifenacin succinate) .... Once daily  as needed 3)  Potassium Chloride Cr 10 Meq Tbcr (Potassium chloride) .[name] Kitchen.. 1 by mouth qd 4)  Aspirin 81 Mg Tbec (Aspirin) .... Take one tablet by mouth daily 5)  Vitamin B-12 1000 Mcg Tabs (Cyanocobalamin) .... 1/2 once daily 6)  Vitamin D3 1000 Unit Tabs (Cholecalciferol) .[name] Kitchen.. 1 tab by mouth once  daily 7)  Amlodipine Besylate 5 Mg Tabs (Amlodipine besylate) .[name] Kitchen.. 1 by mouth every day 8)  Zolpidem Tartrate 10 Mg Tabs (Zolpidem tartrate) .... 1/2-1 tab at bedtime as needed insomnia 9)  Crestor 10 Mg Tabs (Rosuvastatin calcium) .[name] Kitchen.. 1 by mouth once daily for cholesterol  Patient Instructions: 1)  Please schedule a follow-up appointment in 6 months well w/lab.

## 2010-06-16 NOTE — Assessment & Plan Note (Signed)
Summary: f1y  Medications Added OSCAL 500/200 D-3 500-200 MG-UNIT TABS (CALCIUM CARBONATE-VITAMIN D) 1  tab by mouth once daily BROMDAY 0.09 % (DAILY) SOLN (BROMFENAC SODIUM) as  directed PREDNISOLONE ACETATE 1 % SUSP (PREDNISOLONE ACETATE) as diretced BESIVANCE 0.6 % SUSP (BESIFLOXACIN HCL) as directed      Allergies Added:   CC:  check up.  History of Present Illness: Winnell is seen today for F/U of HTN, PAF and carotid disease.  I reviewed her duplex from last week and she had plaque but only 0-39% stenosis.  ASA therapy only is indicated.  She has not had any TIA like symptoms.  She had a lot of questions about degree of stensosis requiring surgery etc.  She had a check up with Dr. Posey Rea and there were no new issue.  She has a distant history of PAF and has been in sinus for a while with no palpitations.  She complains of mild exertional dyspnea but this sounds functional.  She just had succesful cataract surgery.  She is no longer on statins.  She has tried 3 and all make her legs hurt or cause ringing in her ears  Current Problems (verified): 1)  Peripheral Vascular Disease  (ICD-443.9) 2)  Hypertension  (ICD-401.9) 3)  Hyperlipidemia  (ICD-272.4) 4)  Vitamin B12 Deficiency  (ICD-266.2) 5)  Coronary Artery Disease  (ICD-414.00) 6)  Insomnia, Chronic  (ICD-307.42) 7)  Paroxysmal Atrial Fibrillation  (ICD-427.31) 8)  Carotid Artery Disease  (ICD-433.10) 9)  Hysterectomy  () 10)  Chest Pain, Atypical, Hx of  (ICD-V15.89) 11)  Murmur  (ICD-785.2) 12)  Hip Pain  (ICD-719.45) 13)  Cough  (ICD-786.2) 14)  Myalgia  (ICD-729.1) 15)  Gerd  (ICD-530.81) 16)  Occlusion, 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 Take One Tablet By Mouth Daily 2)  Vitamin B-12 1000 Mcg  Tabs (Cyanocobalamin) .... 1/2 Once Daily 3)  Vitamin D3 1000 Unit  Tabs (Cholecalciferol) .[name] Kitchen.. 1 Tab By Mouth Once Daily 4)  Amlodipine Besylate 5 Mg  Tabs (Amlodipine Besylate) .[name] Kitchen.. 1 By Mouth Every Day 5)  Zolpidem Tartrate 10 Mg Tabs (Zolpidem Tartrate) .... 1/2-1 Tab At Bedtime As Needed Insomnia 6)  Oscal 500/200 D-3 500-200 Mg-Unit Tabs (Calcium Carbonate-Vitamin D) .[name] Kitchen.. 1  Tab By Mouth Once Daily 7)  Bromday 0.09 % (Daily) Soln (Bromfenac Sodium) .... As  Directed 8)  Prednisolone Acetate 1 % Susp (Prednisolone Acetate) .... As Diretced 9)  Besivance 0.6 % Susp (Besifloxacin Hcl) .... As Directed  Allergies (verified): 1)  ! Pcn 2)  ! Indocin (Indomethacin Sodium) 3)  Simvastatin (Simvastatin) 4)  Pravachol 5)  Lotensin 6)  Coreg (Carvedilol) 7)  Crestor  Past History:  Past Medical History: Last updated: 05/19/2009 Current Problems:  CHEST PAIN, ATYPICAL, HX OF (ICD-V15.89) normal myovue 2008 MURMUR (ICD-785.2) AV sclerosis and mild AR echo 2008 HIP PAIN (ICD-719.45) COUGH (ICD-786.2) MYALGIA (ICD-729.1) HYPERTENSION (ICD-401.9) PERIPHERAL VASCULAR DISEASE (ICD-443.9) GERD  OCCLUSION, VERTEBRAL ARTERY W/O INFARCTION (ICD-433.20) TOBACCO USER (ICD-305.1) VITAMIN D DEFICIENCY (ICD-268.9) VITAMIN B12 DEFICIENCY (ICD-266.2) BARRETT'S ESOPHAGUS (ICD-530.85) OSTEOPENIA (ICD-733.90) OSTEOARTHRITIS (ICD-715.90) LOW BACK PAIN (ICD-724.2) HYPERLIPIDEMIA (ICD-272.4) CORONARY ARTERY DISEASE (ICD-414.00) Insomnia  Past Surgical History: Last updated: 02/01/2009 Current Problems:  * HYSTERECTOMY  Family  History: Last updated: 04/10/2007 Family History of Arthritis M Family History High cholesterol  Social History: Last updated: 04/10/2007 Single Current Smoker 6 packs/mo Regular exercise-no Retired  Review of Systems       Denies fever, malais, weight loss, blurry vision, decreased  visual acuity, cough, sputum, SOB, hemoptysis, pleuritic pain, palpitaitons, heartburn, abdominal pain, melena, lower extremity edema, claudication, or rash.   Vital Signs:  Patient profile:   76 year old female Height:      64 inches Weight:      182 pounds BMI:     31.35 Pulse rate:   84 / minute Resp:     14 per minute BP sitting:   130 / 80  (left arm)  Vitals Entered By: Kem Parkinson (March 25, 2010 3:45 PM)  Physical Exam  General:  Affect appropriate Healthy:  appears stated age HEENT: normal Neck supple with no adenopathy JVP normal faint left  bruits no thyromegaly Lungs clear with no wheezing and good diaphragmatic motion Heart:  S1/S2 soft sem murmur,rub, gallop or click PMI normal Abdomen: benighn, BS positve, no tenderness, no AAA no bruit.  No HSM or HJR Distal pulses intact with no bruits No edema Neuro non-focal Skin warm and dry    Impression & Recommendations:  Problem # 1:  PERIPHERAL VASCULAR DISEASE (ICD-443.9) 0-39% bilateral carotid disease Duplex 8/12  ASA  Problem # 2:  HYPERLIPIDEMIA (ICD-272.4) F/U Dr Paulette Blanch.  Consider Niacin but doubt she will take statin The following medications were removed from the medication list:    Crestor 10 Mg Tabs (Rosuvastatin calcium) .[name] Kitchen... 1 by mouth once daily for cholesterol  CHOL: 194 (08/14/2009)   LDL: DEL (07/11/2008)   HDL: 52.30 (08/14/2009)   TG: 242.0 (08/14/2009)  Problem # 3:  HYPERTENSION (ICD-401.9) Well controlled Her updated medication list for this problem includes:    Aspirin 81 Mg Tbec (Aspirin) .[name] Kitchen... Take one tablet by mouth daily    Amlodipine Besylate 5 Mg Tabs (Amlodipine besylate) .[name] Kitchen... 1 by mouth every day  Problem # 4:  PAROXYSMAL ATRIAL FIBRILLATION (ICD-427.31) Maint NSR with no further palpitations Her updated medication list for this problem includes:    Aspirin 81 Mg Tbec (Aspirin) .[name] Kitchen... Take one tablet by mouth daily  Patient Instructions: 1)  Your physician  recommends that you schedule a follow-up appointment in: 1 YEAR 2)  Your physician has requested that you have a carotid duplex. This test is an ultrasound of the carotid arteries in your neck. It looks at blood flow through these arteries that supply the brain with blood. Allow one hour for this exam. There are no restrictions or special instructions. SCHEDULE IN 1 YEAR

## 2010-06-16 NOTE — Progress Notes (Signed)
Summary: zolpidem and crestor  Phone Note Other Incoming Call back at fax   Caller: [place] Details for Reason: request refill for zolpidem and crestor Details of Action Taken: ok x 5 refills on both meds  will fax to pharmacy/vg Initial call taken by: Tora Perches,  May 23, 2009 3:55 PM    Prescriptions: CRESTOR 10 MG TABS (ROSUVASTATIN CALCIUM) 1 by mouth once daily for cholesterol  #30 x 5   Entered by:   Tora Perches   Authorized by:   Tresa Garter MD   Signed by:   Tora Perches on 05/23/2009   Method used:   Telephoned to ...       [facility]* (retail)       [address]       [place], [place]  [id number]       Ph: [phone]       Fax: [phone]   RxID:   [id number] ZOLPIDEM TARTRATE 10 MG TABS (ZOLPIDEM TARTRATE) 1/2-1 tab at bedtime as needed insomnia  #30 x 5   Entered by:   Tora Perches   Authorized by:   Tresa Garter MD   Signed by:   Tora Perches on 05/23/2009   Method used:   Telephoned to ...       [facility]* (retail)       [address]       [place], [place]  [id number]       Ph: [phone]       Fax: [phone]   RxID:   [id number]

## 2010-07-27 ENCOUNTER — Ambulatory Visit
Admission: RE | Admit: 2010-07-27 | Discharge: 2010-07-27 | Disposition: A | Discharge status: Home / Self Care | Payer: Medicare Other | Source: Ambulatory Visit | Attending: Orthopaedic Surgery | Admitting: Orthopaedic Surgery

## 2010-07-27 ENCOUNTER — Other Ambulatory Visit: Payer: Self-pay | Admitting: Orthopaedic Surgery

## 2010-07-27 DIAGNOSIS — S42293A Other displaced fracture of upper end of unspecified humerus, initial encounter for closed fracture: Secondary | ICD-10-CM

## 2010-08-03 ENCOUNTER — Encounter (HOSPITAL_COMMUNITY)
Admission: RE | Admit: 2010-08-03 | Discharge: 2010-08-03 | Disposition: A | Discharge status: Home / Self Care | Payer: Medicare Other | Source: Ambulatory Visit | Attending: Orthopaedic Surgery | Admitting: Orthopaedic Surgery

## 2010-08-03 ENCOUNTER — Other Ambulatory Visit (HOSPITAL_COMMUNITY): Payer: Self-pay | Admitting: Orthopaedic Surgery

## 2010-08-03 DIAGNOSIS — S4291XA Fracture of right shoulder girdle, part unspecified, initial encounter for closed fracture: Secondary | ICD-10-CM

## 2010-08-03 LAB — TYPE AND SCREEN
ABO/RH(D): AB POS
Antibody Screen: NEGATIVE

## 2010-08-03 LAB — APTT: aPTT: 27 seconds (ref 24–37)

## 2010-08-03 LAB — DIFFERENTIAL
Basophils Relative: 1 % (ref 0–1)
Eosinophils Absolute: 0.1 10*3/uL (ref 0.0–0.7)
Eosinophils Relative: 2 % (ref 0–5)
Monocytes Absolute: 0.7 10*3/uL (ref 0.1–1.0)
Monocytes Relative: 15 % — ABNORMAL HIGH (ref 3–12)

## 2010-08-03 LAB — SURGICAL PCR SCREEN
MRSA, PCR: NEGATIVE
Staphylococcus aureus: NEGATIVE

## 2010-08-03 LAB — COMPREHENSIVE METABOLIC PANEL
Albumin: 3.8 g/dL (ref 3.5–5.2)
BUN: 19 mg/dL (ref 6–23)
Chloride: 105 mEq/L (ref 96–112)
Creatinine, Ser: 1.01 mg/dL (ref 0.4–1.2)
GFR calc non Af Amer: 54 mL/min — ABNORMAL LOW (ref >60)
Total Bilirubin: 0.6 mg/dL (ref 0.3–1.2)

## 2010-08-03 LAB — PROTIME-INR
INR: 1.01 (ref 0.00–1.49)
Prothrombin Time: 13.5 seconds (ref 11.6–15.2)

## 2010-08-03 LAB — CBC
MCH: 28.9 pg (ref 26.0–34.0)
MCHC: 32.7 g/dL (ref 30.0–36.0)
Platelets: 231 10*3/uL (ref 150–400)
RDW: 15.1 % (ref 11.5–15.5)

## 2010-08-04 ENCOUNTER — Inpatient Hospital Stay (HOSPITAL_COMMUNITY)
Admission: RE | Admit: 2010-08-04 | Discharge: 2010-08-10 | DRG: 483 | Disposition: A | Discharge status: Skilled Nursing Facility | Payer: Medicare Other | Source: Ambulatory Visit | Attending: Orthopaedic Surgery | Admitting: Orthopaedic Surgery

## 2010-08-04 ENCOUNTER — Inpatient Hospital Stay (HOSPITAL_COMMUNITY): Payer: Medicare Other

## 2010-08-04 DIAGNOSIS — I10 Essential (primary) hypertension: Secondary | ICD-10-CM | POA: Diagnosis present

## 2010-08-04 DIAGNOSIS — E559 Vitamin D deficiency, unspecified: Secondary | ICD-10-CM | POA: Diagnosis present

## 2010-08-04 DIAGNOSIS — K219 Gastro-esophageal reflux disease without esophagitis: Secondary | ICD-10-CM | POA: Diagnosis present

## 2010-08-04 DIAGNOSIS — J4489 Other specified chronic obstructive pulmonary disease: Secondary | ICD-10-CM | POA: Diagnosis present

## 2010-08-04 DIAGNOSIS — Z01818 Encounter for other preprocedural examination: Secondary | ICD-10-CM

## 2010-08-04 DIAGNOSIS — S42293A Other displaced fracture of upper end of unspecified humerus, initial encounter for closed fracture: Principal | ICD-10-CM | POA: Diagnosis present

## 2010-08-04 DIAGNOSIS — F17200 Nicotine dependence, unspecified, uncomplicated: Secondary | ICD-10-CM | POA: Diagnosis present

## 2010-08-04 DIAGNOSIS — Z880 Allergy status to penicillin: Secondary | ICD-10-CM

## 2010-08-04 DIAGNOSIS — J449 Chronic obstructive pulmonary disease, unspecified: Secondary | ICD-10-CM | POA: Diagnosis present

## 2010-08-04 DIAGNOSIS — E538 Deficiency of other specified B group vitamins: Secondary | ICD-10-CM | POA: Diagnosis present

## 2010-08-04 DIAGNOSIS — K2270 Barrett's esophagus without dysplasia: Secondary | ICD-10-CM | POA: Diagnosis present

## 2010-08-04 DIAGNOSIS — D62 Acute posthemorrhagic anemia: Secondary | ICD-10-CM | POA: Diagnosis not present

## 2010-08-04 DIAGNOSIS — I2510 Atherosclerotic heart disease of native coronary artery without angina pectoris: Secondary | ICD-10-CM | POA: Diagnosis present

## 2010-08-04 DIAGNOSIS — W1809XA Striking against other object with subsequent fall, initial encounter: Secondary | ICD-10-CM | POA: Diagnosis present

## 2010-08-04 DIAGNOSIS — Z01810 Encounter for preprocedural cardiovascular examination: Secondary | ICD-10-CM

## 2010-08-04 DIAGNOSIS — Z01812 Encounter for preprocedural laboratory examination: Secondary | ICD-10-CM

## 2010-08-04 LAB — URINE MICROSCOPIC-ADD ON

## 2010-08-04 LAB — URINALYSIS, ROUTINE W REFLEX MICROSCOPIC
Bilirubin Urine: NEGATIVE
Glucose, UA: NEGATIVE mg/dL
Ketones, ur: 15 mg/dL — AB
pH: 5.5 (ref 5.0–8.0)

## 2010-08-05 LAB — URINE CULTURE
Colony Count: NO GROWTH
Culture: NO GROWTH

## 2010-08-05 LAB — CBC
Platelets: 169 10*3/uL (ref 150–400)
RBC: 3.24 MIL/uL — ABNORMAL LOW (ref 3.87–5.11)
WBC: 5.2 10*3/uL (ref 4.0–10.5)

## 2010-08-05 LAB — BASIC METABOLIC PANEL
Chloride: 108 mEq/L (ref 96–112)
GFR calc Af Amer: >60 mL/min (ref >60)
Potassium: 3.8 mEq/L (ref 3.5–5.1)

## 2010-08-06 LAB — BASIC METABOLIC PANEL
BUN: 10 mg/dL (ref 6–23)
CO2: 24 mEq/L (ref 19–32)
Calcium: 8 mg/dL — ABNORMAL LOW (ref 8.4–10.5)
Creatinine, Ser: 0.92 mg/dL (ref 0.4–1.2)
GFR calc Af Amer: >60 mL/min (ref >60)

## 2010-08-06 LAB — CBC
Hemoglobin: 9.1 g/dL — ABNORMAL LOW (ref 12.0–15.0)
MCH: 28.7 pg (ref 26.0–34.0)
MCHC: 32.4 g/dL (ref 30.0–36.0)
MCV: 88.6 fL (ref 78.0–100.0)
Platelets: 165 10*3/uL (ref 150–400)

## 2010-08-07 LAB — BASIC METABOLIC PANEL
BUN: 9 mg/dL (ref 6–23)
Calcium: 8.1 mg/dL — ABNORMAL LOW (ref 8.4–10.5)
Chloride: 106 mEq/L (ref 96–112)
Creatinine, Ser: 0.81 mg/dL (ref 0.4–1.2)
GFR calc Af Amer: >60 mL/min (ref >60)
GFR calc non Af Amer: >60 mL/min (ref >60)

## 2010-08-07 LAB — CBC
MCH: 29.1 pg (ref 26.0–34.0)
Platelets: 143 10*3/uL — ABNORMAL LOW (ref 150–400)
RBC: 2.92 MIL/uL — ABNORMAL LOW (ref 3.87–5.11)
RDW: 15.5 % (ref 11.5–15.5)

## 2010-08-08 LAB — CBC
Platelets: 188 10*3/uL (ref 150–400)
RBC: 3 MIL/uL — ABNORMAL LOW (ref 3.87–5.11)
RDW: 15.3 % (ref 11.5–15.5)
WBC: 4.3 10*3/uL (ref 4.0–10.5)

## 2010-08-08 LAB — BASIC METABOLIC PANEL
Chloride: 106 mEq/L (ref 96–112)
GFR calc Af Amer: >60 mL/min (ref >60)
GFR calc non Af Amer: >60 mL/min (ref >60)
Potassium: 3.7 mEq/L (ref 3.5–5.1)
Sodium: 140 mEq/L (ref 135–145)

## 2010-09-02 NOTE — Op Note (Signed)
Margaret Foley, Margaret Foley               ACCOUNT NO.:  [id number]  MEDICAL RECORD NO.:  [id number]           PATIENT TYPE:  I  LOCATION:  5018                         FACILITY:  [facility]  PHYSICIAN:  Claude Manges. Roger Kettles, M.D.DATE OF BIRTH:  [birth year]  DATE OF PROCEDURE:  08/04/2010 DATE OF DISCHARGE:                              OPERATIVE REPORT   PREOPERATIVE DIAGNOSIS:  Displaced four part fracture right humeral head.  POSTOPERATIVE DIAGNOSIS:  Displaced four part fracture right humeral head.  PROCEDURE:  Hemiarthroplasty right humeral head with open reduction and internal fixation of greater and lesser tuberosities.  SURGEON:  Claude Manges. Cleophas Dunker, MD  ASSISTANT:  Oris Drone. Petrarca, PA-C  ANESTHESIA:  General with supplemental interscalene nerve block.  COMPLICATIONS:  None.  COMPONENTS:  Cemented DePuy global fracture proximal humerus with a 44- mm outer diameter head with an 18 mm neck length.  PROCEDURE IN DETAILS:  Ms. Darius was met in the folding area.  I marked her right shoulder as the appropriate operative site.  Dr. Jacklynn Bue performed an interscalene nerve block without difficulty.  Ms. Nevils was then transported to room #1 and placed under general orotracheal anesthesia without difficulty.  She was then placed in a semi-sitting position with the shoulder frame.  The right arm was then prepped with Betadine scrub and DuraPrep from the base of the neck circumferentially below the elbow.  Sterile draping was performed.  A marking pen was used to outline a deltopectoral groove incision via sharp dissection.  With a 15 blade knife, incision was made from just proximal to the coracoid distally and slightly obliquely along the deltopectoral groove approximately 4 to 5 inches, via sharp dissection incision was carried down to subcutaneous tissue.  Gross bleeders were Bovie coagulated.  Via further blunt dissection, the deltopectoral groove was identified with the cephalic  vein.  The cephalic vein was carefully retracted laterally.  The vein remained intact throughout the procedure.  A self-retaining retractor was then inserted via blunt dissection, the deltopectoral groove was further developed and a self-retaining retractor placed more deeply.  The conjoint tendon was identified.  It was able to palpate the musculocutaneous nerve.  The clavipectoral fascia was then incised, so I could visualize the shoulder.  The fracture was actually in 5 parts.  There were 2 fractures within the greater tuberosity and one with a lesser tuberosity and then the humeral head which only measured 11 mm in diameter.  The tendon was identified and at that point I was able to separate the greater tuberosity fractures from the lesser tuberosity.  I placed a FiberWire around the lesser tuberosity and then freed it manually and then retracted it medially.  In similar fashion, I was able to manually release the 2 greater tuberosity fractures to which were attached the supra and infraspinatus tendons and then freed them, so that I could retract them with a FiberWire.  I was thus able to visualize the joint and the free humeral head.  This was removed in one piece.  It measured 44 mm an outer diameter with an 18-mm thickness.  The glenoid was then irrigated with saline  solution.  Any loose material was removed.  Sharp edges were removed around the proximal humerus.  The humeral shaft was then carefully delivered into the wound.  It was clean.  We hand reamed to a 10 mm component and then we inserted the 10-mm reamer with the appropriate version and it was solid enough that we could then reduce it with a 44-mm outer diameter head with an 18 mm neck length. We obtained the appropriate version and felt that at that point we had the appropriate length so that we could sublux the head 50% and slightly subluxed it, I could raise it overhead and abduct it, did not appear to be too  tight.  We marked the anterior fin just medial to the biceps groove.  The biceps tendon at that point was tenodesed to its retinaculum and soft tissue with 0 Ethibond suture.  We then irrigated the humeral shaft.  We inserted a plastic cement restrictor after appropriate measuring and using a #2 cement restrictor. The glue was mixed and it was impacted into the humeral shaft and the prosthesis inserted to its appropriate length.  We removed any extraneous methacrylate and held in position until the methacrylate matured it approximately 14 minutes.  At that point we inspected the glenoid.  There was no further extraneous methacrylate.  Prior to inserting the methacrylate, we made 6 drill holes along the medial anterior and lateral aspect of the humerus and inserted #2 FiberWires to reattach the lesser and greater tuberosities  The above mentioned sutures were then sutured into the greater tuberosity, lesser tuberosity and a third stitch was placed with one lead through the greater and the other lead to the lesser tuberosity.  A fourth stitch was placed through the greater tuberosity into the hole in the medial fin and then through the lesser tuberosity for further reduction of the fragments.  Bone graft was then placed into the hallows of the prosthesis in between the fins where this was obtained from the humeral head and the lesser and greater tuberosities were then reduced and then sutured in place with the above aforementioned #2 FiberWire.  There were no gaps in the rotator cuff though we had excellent apposition. We had slight overlap of the tuberosities on the shaft and with a full range of motion, there was no motion of the tuberosities or the cuff or any subluxation of the prosthesis.  The wound was then irrigated with saline solution.  The deltopectoral groove closed with a running 0 Vicryl, subcu with 3-0 Monocryl.  Skin closed with skin clips.  Sterile bulky dressing was  applied followed by a sling.  The patient tolerated the procedure without complications.  Technically the procedure went very nicely.  We thought we had excellent result.     Claude Manges. Cleophas Dunker, M.D.     PWW/MEDQ  D:  08/04/2010  T:  08/05/2010  Job:  [id number]  Electronically Signed by Norlene Campbell M.D. on 09/02/2010 11:27:24 AM

## 2010-09-02 NOTE — Discharge Summary (Signed)
NAMERAIZEL, WESOLOWSKI               ACCOUNT NO.:  [id number]  MEDICAL RECORD NO.:  [id number]           PATIENT TYPE:  I  LOCATION:  5018                         FACILITY:  [facility]  PHYSICIAN:  Claude Manges. Ciria Bernardini, M.D.DATE OF BIRTH:  1934/11/05  DATE OF ADMISSION:  08/04/2010 DATE OF DISCHARGE:  08/10/2010                        DISCHARGE SUMMARY - REFERRING   ADMISSION DIAGNOSIS:  Displaced four-part fracture, right humeral head.  DISCHARGE DIAGNOSES: 1. Displaced four-part fracture, right humeral head. 2. Acute blood loss anemia. 3. History of heart murmur. 4. Hypertension. 5. Gastroesophageal reflux disease. 6. Vitamin D deficiency. 7. Vitamin B12 deficiency. 8. Barrett's esophagitis. 9. Coronary artery disease.  PROCEDURE:  Hemiarthroplasty right humeral head with open reduction and internal fixation of greater and lesser tuberosities.  HISTORY:  Ms. Haberle is a very pleasant 76 year old white female who sustained a fall prior to her hospitalization landing onto her right arm, sustaining an injury to the right shoulder.  She was seen in the office where she was noted to have a comminuted fracture of the right humeral head.  A CT scan was ordered, and she was noted to have at least four pieces with the humeral head being a small wafer and was felt that she is a candidate for hemiarthroplasty.  Admitted at this time for the care.  HOSPITAL COURSE:  A 76 year old white female admitted on August 04, 2010. After appropriate laboratory studies were obtained as well as 1 g of vancomycin IV on-call to the operating room, she was taken to the operating room where she underwent a hemiarthroplasty of the right humeral head with open reduction and internal fixation greater and lesser tuberosities by Dr. Norlene Campbell assisted by Oris Drone. Petrarca.  She tolerated the procedure well.  She was placed on a reduced dose Dilaudid PCA pump.  SCDs were ordered as well as knee-high TED  hose.  Vancomycin 1 g IV q.12 h x1 more dose was ordered.  Foley was placed intraoperatively.  Consults with OT for pendulum exercise was ordered.  She was allowed out of bed to chair the following day.  She was weaned off of her oxygen keeping her sats greater than 92%.  Weaned off her PCA and saline lock was ordered.  Social service was ordered for placement versus home evaluation.  After talking to the family, she felt that short term SNF was indicated.  Postop day #2, her dressing was changed and her wound remained benign.  Postop day #3, she did have hypokalemia and this was corrected with KCl 20 mEq p.o. b.i.d. Remainder of her hospital course was uneventful, and she is being discharged today the 26th to skilled nursing facility for continued care.  LABORATORY DATA:  She was admitted with hemoglobin 11.4, hematocrit 34.9%, white count 4700, platelet count was 231,000.  Discharge hemoglobin was 8.5, hematocrit 26.2%, white count 4300, platelets 188,000.  Preop protime 13.5, INR 1.01, PTT 27.  Preop sodium 140, potassium 3.8, chloride 105, CO2 27, glucose 88, BUN 19, creatinine 0.01, GFR was 54, bilirubin total 0.6, alk phos 79, GOT 20, GPT 15, total protein 6.5, albumin 3.8 and calcium was 9.1.  She dropped to potassium 3.4 on August 07, 2010.  On the 24th revealed sodium 140, potassium 3.7, chloride 106, CO2 28, glucose 94, BUN 10, creatinine 0.77, GFR was greater than 60.  Calcium was 8.6.  Urinalysis revealed on the 20th a cloudy yellow, pH was 5.5.  She has 15 ketones, trace of leukocytes.  Few squamous, she had calcium oxalate crystals and hyaline casts.  Three to six whites, few bacteria.  Blood type was AB positive. Antibody screen negative.  Urine culture showed no growth.  DISCHARGE INSTRUCTIONS:  She will be allowed to perform pendulum exercises only at this time.  Care should be given not to externally rotate the arm past neutral because of the repair.  She can have  full use of her elbow and wrist.  She is discharged on a house diet. Medications will be provided on the med rec sheet.  Increase her activity slowly.  Keep her incision clean and dry.  Change the dressing daily with 4x4s and tape.  Leave the white Steri-Strips on.  May shower with saran wrap over the wound.  We may change her dressing on Wednesday on August 12, 2010.  She may shower with the original dressing on.  She is to use her sling for protection except for when occupational therapy is working with her.  PT for gait work.  She will need to follow up with Dr. Cleophas Dunker on August 10, 2010.  Call to make an appointment at [phone].  She was discharged in improved condition.     Oris Drone Petrarca, P.A.-C.   ______________________________ Claude Manges. Cleophas Dunker, M.D.    BDP/MEDQ  D:  08/10/2010  T:  08/10/2010  Job:  [id number]  Electronically Signed by Jacqualine Code P.A.-C. on 08/13/2010 01:59:51 PM Electronically Signed by Norlene Campbell M.D. on 09/02/2010 11:27:19 AM

## 2010-09-29 NOTE — Assessment & Plan Note (Signed)
[facility]                            CARDIOLOGY OFFICE NOTE   NAME:Margaret Foley                      MRN:          [id number]  DATE:01/04/2008                            DOB:          [birth year]    Ms. Margaret Foley returns today for followup.  She has had a history of  a murmur and atypical chest pain.  She had a followup Myoview, which was  normal with no evidence of coronary disease.  She continues to have  atypical chest pain, which is probably related to fibromyalgia.   Her murmur is benign.  She has some aortic sclerosis and mild aortic  insufficiency with good LV function.   There is a history of probable right vertebral artery dissection or  occlusion.  She was supposed to have a carotid duplex study here before  she saw me, but has not had it yet.  We will follow this up, but I do  not think it should be clinically significant and I have not heard  carotid bruits in the past.   Her coronary risk factors include hypertension, hypercholesterolemia,  which were being treated.   REVIEW OF SYSTEMS:  Otherwise negative.   PHYSICAL EXAMINATION:  GENERAL:  Her exam is remarkable for somewhat  anxious white female in no distress.  VITAL SIGNS:  Her blood pressure is 129/77, pulse 82, respiratory rate  14, afebrile.  Weight 187.  HEENT:  Unremarkable.  Carotids normal without bruit.  No  lymphadenopathy, thyromegaly, or JVP elevation.  LUNGS:  Clear, good diaphragmatic motion.  No wheezing.  S1 and S2 with  a soft systolic murmur.  PMI normal.  ABDOMEN:  Benign.  Bowel sounds positive.  No AAA, no tenderness, no  bruit, no hepatosplenomegaly, no hepatojugular reflux, no bruit, no  tenderness.  EXTREMITIES:  Distal pulses are intact.  No edema.  NEURO:  Nonfocal.  SKIN:  Warm and dry.  No muscular weakness.   MEDICATIONS:  Medications currently include;  1. Cyclobenzaprine.  2. Pravastatin 20 a day.  3. Benazepril 20/12.5.  4. Vitamin  D.   The patient is allergic to INDOCIN and PENICILLIN.   IMPRESSION:  1. Atypical chest pain.  Followup with Dr. Posey Rea.  Myoview normal,      likely related to fibromyalgia.  2. Hypercholesterolemia.  Continue pravastatin.  Lipid and liver      profile in 6 months.  3. Hypertension, currently well controlled.  Continue benazepril and      hydrochlorothiazide.  4. Question right vertebral artery dissection by CT scan of the neck,      Oct 05, 2006.  This was initially gotten for question of sinus      infection and throat pain.  Followup carotid duplex today.  5. Aortic valve sclerosis with mild aortic insufficiency.  Followup      echocardiogram in 2-3 years.  No need for subacute bacterial      endocarditis prophylaxis.     Noralyn Pick. Eden Emms, MD, [facility]  Electronically Signed    PCN/MedQ  DD: 01/04/2008  DT: 01/05/2008  Job #: [id number]

## 2010-09-29 NOTE — Assessment & Plan Note (Signed)
[facility]                            CARDIOLOGY OFFICE NOTE   NAME:Foley, Margaret [place]                      MRN:          [id number]  DATE:12/09/2006                            DOB:          08-Jan-1935    Margaret Foley is a delightful 76 year old patient referred for abnormal CT  scan of the neck involving a question of a right vertebral artery  dissection.   She has had occasional chest pain, and has a diagnosis of a murmur from  many years ago.   We are asked to evaluate these findings by Dr. Posey Rea.   In talking to the patient, she was sick at the end of March to the  beginning of April.  She initially had what she thought was a sinus  infection.  She then had some ear pain and a very stiff neck.  She had a  scan done on May 21.  At that time the impression by Dr. Alfredo Batty showed  no focal lesion to explain sore throat and mandibular pain.  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  She had advanced cervical  spondylolysis. .   In talking to the patient, her symptoms seemed to resolve.  She has  significant arthritis in her neck, and she got a muscle relaxant with  some relief.  She still has decreased range of motion.  She has not had  other diagnoses of vascular disease.  She is a long term smoker.   Her other coronary risk factors include only smoking.   Currently, she feels well.  She has occasional pain in her shoulders and  chest.  This does not sound anginal.  She carries a diagnosis of  fibromyalgia.  The pain is muscular in nature.  It radiates to the  shoulders and back.  There is some relief with nonsteroidals and  Tylenol.  It is not necessarily exertional and it has not been  progressive.   She also says she has had a murmur, diagnosed by Dr. Posey Rea many  years ago.  She has not had a recent 2D echocardiogram.   Her review of systems, otherwise,  negative.   PAST MEDICAL HISTORY:  1. Previous hysterectomy.  2. Osteopenia.  3. Barrett's esophagus.  4. Osteoarthritis, particularly of the neck.  5. Smoking.   She is a previous employee of [employer].  She is divorced.  She has 2 older sons who live in the area.  She tries to be active, but  is limited by her arthritis.  She likes to garden and paint.  She  continues to smoke a pack a day, and does not drink excessively.   The patient's medications include:  1. Hydrocodone.  2. Protonix 40 a day.  3. Vesicare 5 mg a day.  4. An aspirin a day.  5. Vitamin D.  6. Vitamin B.  7. Os-Cal.   She is allergic to PENICILLIN and INDOCIN.   FAMILY HISTORY:  Noncontributory.   PHYSICAL EXAMINATION:  Her exam is remarkable for an elderly  white  female in no distress.  Blood pressure is 130/70, pulse is 86 and regular.  Respiratory rate is  14.  She is afebrile.  HEENT:  Normal.  NECK:  Supple.  There is no thyromegaly or lymphadenopathy.  No JVP  elevation and no bruits.  LUNGS:  Clear with good diaphragmatic motion, no wheezing.  There is an S1, S2, with a soft systolic ejection murmur.  PMI is  normal.  ABDOMEN:  Benign.  There is no triple A, no bruits, no  hepatosplenomegaly, no hepatojugular reflux, no tenderness.  Bowel  sounds are positive.  Femorals are +3 bilaterally without bruit.  Distal pulses are intact.  No edema.  NEURO:  Nonfocal.  There is no muscular weakness.   Baseline EKG is normal.   IMPRESSION:  1. Question vascular disease with silent or previous right vertebral      artery dissection.  I suspect that this was a small non-dominant      artery, and the scan was not a dedicated CTA.  I actually do not      think it had anything to do with her symptoms at the end of March;      however, we will refer her for carotid duplex to further survey the      carotid and vertebral arteries, and make sure there is no      significant vascular  disease.  2. She has had fibromyalgia with atypical chest pain.  Given her long      history of smoking and her age, I think it is reasonable to proceed      with the stress Myoview.  This will be with adenosine, given her      limited mobility and arthritis.  3. History of murmur, sounds benign but it is long standing.  We will      check a 2D echocardiogram to further assess this.  4. Smoking.  The patient does not seem very motivated to quit.  Will      leave this up to Dr. Posey Rea as to whether she would be a      candidate for Wellbutrin or Chantix.  I explained to her the      relationship between vascular disease and smoking.  5. History of Barrett's esophagus.  Follow up with GI, surveillance      EGDs every 2-3 years with possible biopsies.  Continue Protonix 40      a day.  6. Arthritis.  Continue hydrocodone and aspirin, as well as      antiinflammatories as seen fit.  She has significant cervical      spondylolysis which I think has more to do with her symptoms than      any problem with her vertebral arteries.   I will see the patient back after her test, and she will primarily  follow up with Dr. Posey Rea.     Margaret Foley. Eden Emms, MD, [facility]  Electronically Signed    PCN/MedQ  DD: 12/09/2006  DT: 12/09/2006  Job #: [id number]   cc:   Georgina Quint. Plotnikov, MD

## 2010-10-02 NOTE — Assessment & Plan Note (Signed)
[facility]                           PRIMARY CARE OFFICE NOTE   NAME:Margaret Foley, Margaret Foley                      MRN:          [id number]  DATE:04/05/2006                            DOB:          07-03-1934    The patient is a 76 year old female who has not been seen here since  2000, comes in to reestablish primary to address her chronic problems  with osteoarthritis, GERD, Barrett's, osteopenia.  She complains of a  fair amount of arthritis pains in the upper back, lower back, and she  rates it 5/10.  She is never pain-free.  She has been using Tylenol with  minimal relief.   SOCIAL HISTORY:  She continues to smoke 3 to 4 packs a day.   Past medical history, family history, and social history as per May 29, 1997 note.   MEDICATIONS:  1. Tylenol p.r.n.  2. Protonix 40 mg daily.   REVIEW OF SYSTEMS:  Arthritis pain.  No indigestion.  No dysphagia.  Occasional cough.  Chronic problems with urinary incontinence.  The rest  of the 18-point review of systems is negative.   PHYSICAL:  Blood pressure 162/86, pulse 76, temperature 99.4, weight 190  pounds.  She is in no acute distress.  HEENT:  Moist mucosa.  NECK:  Supple.  No thyromegaly or bruit.  LUNGS:  Clear.  No wheeze or rales.  HEART:  S1, S2 no gallop.  ABDOMEN:  Soft and nontender.  No organomegaly or mass felt.  LOWER EXTREMITIES:  Without edema.  She is alert, oriented, and appropriate.  Denies being depressed.  SKIN:  With seborrheic keratosis on the trunk.  There is a discolored  mole on the right upper cheek.   LABS:  EKG was normal sinus rhythm.   ASSESSMENT AND PLAN:  1. Osteoarthritis, extensive, with quite a bit of chronic pain.      Prescribed Ultram ER 100 mg daily for 3 days then 200 mg daily.      Samples provided.  Follow up with me in about a month.  2. Obtain sed rate.  3. Barrett's esophagitis on Protonix 40 mg daily, endoscopy pending.  4. Elevated blood pressure.   We will recheck in 1 month.  No added      salt diet.  She will try to check it at the drug store.  5. Chronic obstructive pulmonary disease, unknown degree.  Obtain      chest x-ray.  Advised to discontinue tobacco.  6. Osteopenia.  Check bone density scan later.  Check vitamin D level.  7. Mole.  Skin biopsy within the next few weeks.  8. Urinary incontinence.  Trial of Vesicare 5 mg daily.  She is status      post hysterectomy.     Georgina Quint. Plotnikov, MD  Electronically Signed    AVP/MedQ  DD: 04/05/2006  DT: 04/05/2006  Job #: [id number]

## 2010-10-02 NOTE — Op Note (Signed)
[place]  Patient:    Margaret Foley, Margaret Foley                      MRN: [id number] Proc. Date: 08/11/99 Adm. Date:  [id number] Attending:  Maryanna Shape                           Operative Report  PREOPERATIVE DIAGNOSIS:  Right foot Mortons neuroma, third web space.  POSTOPERATIVE DIAGNOSIS:  Right foot Mortons neuroma, third web space.  OPERATIVE PROCEDURE:  Patient was given an ankle block anesthetic by Anesthesia  Department.  A proximal pneumatic tourniquet was applied to the thigh.  After prep with a Duraprep and usual draping, I initially tried to exsanguinate the foot with an Esmarch and used the Esmarch as the tourniquet; however, this seemed to leak. Therefore I reapplied it and then also blew the tourniquet up to approximately 00 mmHg.  At the third web space a short incision was made between the third and fourth metatarsal head area.  Subcutaneous tissues were bluntly dissected, exposing the intermetatarsal area.  The intermetatarsal ligament was divided.  Pressure n the plantar surface of the foot caused a moderate sized Mortons neuroma to pop into view.  I divided the contributing nerves to this and the exiting nerves and removed the mass.  This allowed the soft tissues to fall together, then closed he skin with multiple interrupted 5-0 sutures, applied a bulky dressing, and xeroform to the skin.  Instructions were for the patient to take Vicodin for pain and mostly house rest for at least four days, elevate the foot.  When she ambulates walk on the heel.  Take Vicodin for pain.  Call me with concerns, otherwise try to see e in the office Friday for dressing change.DD:  08/11/99 TD:  08/11/99 Job: [id number]

## 2010-10-02 NOTE — Assessment & Plan Note (Signed)
[facility]                           PRIMARY CARE OFFICE NOTE   NAME:Pasley, TERRY ABILA                      MRN:          [id number]  DATE:05/11/2006                            DOB:          1934/06/28    PROCEDURE:  Skin biopsy.   INDICATIONS:  Moles with irregular color. Risks, including scar  formation, bleeding, infection, incomplete procedure,as well as  benefits were explained to the patient in detail.    She agreed to proceed.  She was placed in decubitus position.  Mole  #1, measuring 6 mm anteriorly from the right ear, was prepped with  Betadine and alcohol, local with 23 cc of 2% lidocaine with epinephrine,  shaved biopsy with Dermablade performed.  Specimen sent to the lab.  The  wound was treated with hyfercator.  Tolerated it well.  Complications  none.  Band-Aid with antibiotic ointment.   Mole #2 measuring 3 mm anteriorly to mole#1 was removed in identical  fashion.  Specimen sent to lab, tolerated it well.  Complications none.     Georgina Quint. Plotnikov, MD  Electronically Signed    AVP/MedQ  DD: 05/13/2006  DT: 05/13/2006  Job #: [id number]

## 2010-10-02 NOTE — Letter (Signed)
April 05, 2006    [facility] [employer]  [address]  [place], [place] [address]   RE:  JELENE, ALBANO  MRN:  [id number]  /  DOB:  [birth year]   Dear Sharee Pimple:   RE:  [facility] [facility] [place]  [address]  [place], [place] [address]   Please excuse this patient of mine, Ms. Raulston, from her jury duty on  April 25, 2006, Juror# [id number].  Due to extensive arthritis with pain,  she is unable to sit for any prolonged period of time.    Sincerely,      Georgina Quint. Plotnikov, MD  Electronically Signed    AVP/MedQ  DD: 04/05/2006  DT: 04/05/2006  Job #: [id number]

## 2010-11-10 ENCOUNTER — Encounter: Payer: Self-pay | Admitting: Internal Medicine

## 2010-11-12 ENCOUNTER — Encounter: Payer: Medicare Other | Admitting: Internal Medicine

## 2010-11-12 DIAGNOSIS — Z0289 Encounter for other administrative examinations: Secondary | ICD-10-CM

## 2011-03-16 ENCOUNTER — Other Ambulatory Visit: Payer: Self-pay | Admitting: Cardiovascular Disease

## 2011-03-16 DIAGNOSIS — I6529 Occlusion and stenosis of unspecified carotid artery: Secondary | ICD-10-CM

## 2011-03-18 ENCOUNTER — Encounter: Payer: Self-pay | Admitting: Cardiovascular Disease

## 2011-03-18 ENCOUNTER — Ambulatory Visit (INDEPENDENT_AMBULATORY_CARE_PROVIDER_SITE_OTHER): Payer: Medicare Other | Admitting: Cardiovascular Disease

## 2011-03-18 ENCOUNTER — Encounter: Payer: Medicare Other | Admitting: Cardiology

## 2011-03-18 ENCOUNTER — Encounter (INDEPENDENT_AMBULATORY_CARE_PROVIDER_SITE_OTHER): Payer: Medicare Other | Admitting: *Deleted

## 2011-03-18 VITALS — BP 140/80 | HR 63 | Wt 180.0 lb

## 2011-03-18 DIAGNOSIS — I6529 Occlusion and stenosis of unspecified carotid artery: Secondary | ICD-10-CM

## 2011-03-18 DIAGNOSIS — I10 Essential (primary) hypertension: Secondary | ICD-10-CM

## 2011-03-18 NOTE — Assessment & Plan Note (Signed)
Cholesterol is at goal.  Continue current dose of statin and diet Rx.  No myalgias or side effects.  F/U  LFT's in 6 months. No results found for this basename: LDLCALC             

## 2011-03-18 NOTE — Progress Notes (Signed)
Margaret Foley is seen today for F/U of HTN, PAF and carotid disease. I reviewed her duplex from today and she had plaque but only 0-39% stenosis. ASA therapy only is indicated. She has not had any TIA like symptoms. . She had a check up with Dr. Posey Rea and there were no new issue. She has a distant history of PAF and has been in sinus for a while with no palpitations  Fell earlier in the year and had shoulder surgery which she is still rehabing  ROS: Denies fever, malais, weight loss, blurry vision, decreased visual acuity, cough, sputum, SOB, hemoptysis, pleuritic pain, palpitaitons, heartburn, abdominal pain, melena, lower extremity edema, claudication, or rash.  All other systems reviewed and negative  General: Affect appropriate Healthy:  appears stated age HEENT: normal Neck supple with no adenopathy JVP normal no bruits no thyromegaly Lungs clear with no wheezing and good diaphragmatic motion Heart:  S1/S2 sytstolic murmur no ,rub, gallop or click PMI normal Abdomen: benighn, BS positve, no tenderness, no AAA no bruit.  No HSM or HJR Distal pulses intact with no bruits No edema Neuro non-focal Skin warm and dry No muscular weakness   Current Outpatient Prescriptions  Medication Sig Dispense Refill  . amLODipine (NORVASC) 5 MG tablet Take 5 mg by mouth daily.        [name] Kitchen aspirin 81 MG EC tablet Take 81 mg by mouth daily.        [name] Kitchen Besifloxacin HCl (BESIVANCE) 0.6 % SUSP Apply to eye.        . calcium-vitamin D (OSCAL WITH D) 500-200 MG-UNIT per tablet Take 1 tablet by mouth daily.        . Cholecalciferol (VITAMIN D3) 1000 UNITS tablet Take 1,000 Units by mouth daily.        . cyanocobalamin 1000 MCG tablet Take 500 mcg by mouth daily.        . Omega-3 Fatty Acids (OMEGA 3 PO) Take 1 tablet by mouth daily.        [name] Kitchen zolpidem (AMBIEN) 10 MG tablet Take 5-10 mg by mouth at bedtime as needed.          Allergies  Benazepril hcl; Carvedilol; Indomethacin; Penicillins; Pravastatin sodium;  Rosuvastatin; and Simvastatin  Electrocardiogram:  NSR 62 normal ECG fusion beat  Assessment and Plan

## 2011-03-18 NOTE — Patient Instructions (Signed)
Your physician wants you to follow-up in: 1 year. You will receive a reminder letter in the mail two months in advance. If you don't receive a letter, please call our office to schedule the follow-up appointment.  

## 2011-03-18 NOTE — Assessment & Plan Note (Signed)
Well controlled.  Continue current medications and low sodium Dash type diet.    

## 2011-03-18 NOTE — Assessment & Plan Note (Signed)
Stable F/U duplex in 2 years

## 2011-04-19 ENCOUNTER — Other Ambulatory Visit: Payer: Self-pay | Admitting: Orthopaedic Surgery

## 2011-04-19 DIAGNOSIS — M25511 Pain in right shoulder: Secondary | ICD-10-CM

## 2011-04-28 ENCOUNTER — Ambulatory Visit
Admission: RE | Admit: 2011-04-28 | Discharge: 2011-04-28 | Disposition: A | Discharge status: Home / Self Care | Payer: Medicare Other | Source: Ambulatory Visit | Attending: Orthopaedic Surgery | Admitting: Orthopaedic Surgery

## 2011-04-28 DIAGNOSIS — M25511 Pain in right shoulder: Secondary | ICD-10-CM

## 2011-04-28 MED ORDER — IOHEXOL 180 MG/ML  SOLN
12.0000 mL | Freq: Once | INTRAMUSCULAR | Status: AC | PRN
  Administered 2011-04-28: 12 mL via INTRA_ARTICULAR

## 2011-07-09 DIAGNOSIS — Z96619 Presence of unspecified artificial shoulder joint: Secondary | ICD-10-CM | POA: Diagnosis not present

## 2011-07-09 DIAGNOSIS — Z471 Aftercare following joint replacement surgery: Secondary | ICD-10-CM | POA: Diagnosis not present

## 2011-07-09 DIAGNOSIS — M67919 Unspecified disorder of synovium and tendon, unspecified shoulder: Secondary | ICD-10-CM | POA: Diagnosis not present

## 2011-07-23 DIAGNOSIS — Z96619 Presence of unspecified artificial shoulder joint: Secondary | ICD-10-CM | POA: Diagnosis not present

## 2011-07-23 DIAGNOSIS — Z471 Aftercare following joint replacement surgery: Secondary | ICD-10-CM | POA: Diagnosis not present

## 2011-07-28 DIAGNOSIS — M25519 Pain in unspecified shoulder: Secondary | ICD-10-CM | POA: Diagnosis not present

## 2011-12-10 DIAGNOSIS — Z471 Aftercare following joint replacement surgery: Secondary | ICD-10-CM | POA: Diagnosis not present

## 2011-12-10 DIAGNOSIS — Z96619 Presence of unspecified artificial shoulder joint: Secondary | ICD-10-CM | POA: Diagnosis not present

## 2011-12-27 ENCOUNTER — Other Ambulatory Visit: Payer: Self-pay | Admitting: Orthopaedic Surgery

## 2011-12-27 DIAGNOSIS — M545 Low back pain: Secondary | ICD-10-CM

## 2011-12-27 DIAGNOSIS — I719 Aortic aneurysm of unspecified site, without rupture: Secondary | ICD-10-CM

## 2012-01-05 ENCOUNTER — Ambulatory Visit
Admission: RE | Admit: 2012-01-05 | Discharge: 2012-01-05 | Disposition: A | Discharge status: Home / Self Care | Payer: Medicare Other | Source: Ambulatory Visit | Attending: Orthopaedic Surgery | Admitting: Orthopaedic Surgery

## 2012-01-05 DIAGNOSIS — M47817 Spondylosis without myelopathy or radiculopathy, lumbosacral region: Secondary | ICD-10-CM | POA: Diagnosis not present

## 2012-01-05 DIAGNOSIS — M5126 Other intervertebral disc displacement, lumbar region: Secondary | ICD-10-CM | POA: Diagnosis not present

## 2012-01-05 DIAGNOSIS — I700 Atherosclerosis of aorta: Secondary | ICD-10-CM | POA: Diagnosis not present

## 2012-01-05 DIAGNOSIS — M545 Low back pain: Secondary | ICD-10-CM

## 2012-01-05 DIAGNOSIS — I719 Aortic aneurysm of unspecified site, without rupture: Secondary | ICD-10-CM

## 2012-01-05 DIAGNOSIS — I714 Abdominal aortic aneurysm, without rupture: Secondary | ICD-10-CM | POA: Diagnosis not present

## 2012-01-07 DIAGNOSIS — M545 Low back pain: Secondary | ICD-10-CM | POA: Diagnosis not present

## 2012-01-25 DIAGNOSIS — M5137 Other intervertebral disc degeneration, lumbosacral region: Secondary | ICD-10-CM | POA: Diagnosis not present

## 2012-01-25 DIAGNOSIS — IMO0002 Reserved for concepts with insufficient information to code with codable children: Secondary | ICD-10-CM | POA: Diagnosis not present

## 2012-01-31 DIAGNOSIS — M545 Low back pain: Secondary | ICD-10-CM | POA: Diagnosis not present

## 2012-01-31 DIAGNOSIS — M5137 Other intervertebral disc degeneration, lumbosacral region: Secondary | ICD-10-CM | POA: Diagnosis not present

## 2012-02-03 DIAGNOSIS — M5137 Other intervertebral disc degeneration, lumbosacral region: Secondary | ICD-10-CM | POA: Diagnosis not present

## 2012-02-03 DIAGNOSIS — M545 Low back pain: Secondary | ICD-10-CM | POA: Diagnosis not present

## 2012-02-04 ENCOUNTER — Encounter: Payer: Self-pay | Admitting: Surgery

## 2012-02-07 ENCOUNTER — Encounter: Payer: Self-pay | Admitting: Surgery

## 2012-02-07 ENCOUNTER — Ambulatory Visit (INDEPENDENT_AMBULATORY_CARE_PROVIDER_SITE_OTHER): Payer: Medicare Other | Admitting: Surgery

## 2012-02-07 VITALS — BP 163/88 | HR 77 | Resp 16 | Ht 63.0 in | Wt 182.5 lb

## 2012-02-07 DIAGNOSIS — I714 Abdominal aortic aneurysm, without rupture: Secondary | ICD-10-CM | POA: Insufficient documentation

## 2012-02-07 NOTE — Progress Notes (Signed)
[facility] [place]   Patient name: Margaret Foley MRN: [id number] DOB: [birth year] Sex: female   Referred by: Dr. Cleophas Dunker  Reason for referral:  Chief Complaint  Patient presents with  . AAA    Ref. by Dr. Jeral Pinch  . New Evaluation    HISTORY OF PRESENT ILLNESS: The patient is is seen today for evaluation of an abdominal aortic aneurysm. This was detected on an x-ray for back issues. She went on to have a ultrasound which showed a 3.8 x 4.1 cm infrarenal abdominal aortic aneurysm. The patient states that she was told in the past that she had a small aneurysm approximately 5 years ago. She denies any abdominal pain. She does have a family history of aneurysms. Her mother had a small aneurysm, but lived to be 19 and did not have complications from this. The patient has a long-standing history of tobacco abuse. She continues to smoke but has cut back in its daily stopped multiple times. She is seen by Dr. Eden Emms for carotid stenosis.  The patient suffers from hypertension and hypercholesterolemia. She does not take any medications for these because she states that they cause a buzzing and remaining in her head which is continuous. This was particularly true of the statin medication. Her biggest issue has been arthritis. She has been treating this with rehabilitation and exercise. She has had a right hip injection in 2011.  Past Medical History  Diagnosis Date  . Hypertension   . GERD (gastroesophageal reflux disease)   . Hyperlipidemia   . Heart murmur     av sclerosis and mild ar echo 2008  . Chest pain     atypical normal myovue 2008  . Hip pain   . Cough   . Myalgia and myositis, unspecified     statin intolerant  . Peripheral vascular disease   . 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 Tobacco use disorder   . Unspecified vitamin D deficiency   . Other B-complex deficiencies   . Barrett's esophagus    . Osteopenia   . Osteoarthritis   . Lumbago   . CAD (coronary artery disease)   . Insomnia, unspecified   . COPD (chronic obstructive pulmonary disease)   . Carotid artery occlusion   . Cancer     Pre cancerous Uterine Tumor    Past Surgical History  Procedure Date  . Cataract surgery 2011  . Abdominal hysterectomy 1986    Complete  . Joint replacement 2012    Right Shoulder and arm    History   Social History  . Marital Status: Single    Spouse Name: N/A    Number of Children: N/A  . Years of Education: N/A   Occupational History  . retired    Social History Main Topics  . Smoking status: Current Every Day Smoker    Types: Cigarettes  . Smokeless tobacco: Not on file   Comment: 6 packs per mo.  . Alcohol Use: No  . Drug Use: No  . Sexually Active: Not on file   Other Topics Concern  . Not on file   Social History Narrative   Regular exercise--yes daily stretching.    Family History  Problem Relation Age of Onset  . Arthritis Mother   . Heart disease Mother   . Hypertension Mother   . Heart attack Mother   . Hyperlipidemia    . Heart disease Son   .  Hypertension Son   . Cancer Father   . Hyperlipidemia Sister   . Cancer Brother     Allergies as of 02/07/2012 - Review Complete 02/07/2012  Allergen Reaction Noted  . Benazepril hcl  07/16/2008  . Carvedilol  02/03/2009  . Indomethacin    . Penicillins    . Pravastatin sodium  07/16/2008  . Rosuvastatin  08/19/2009  . Simvastatin  12/13/2007    Current Outpatient Prescriptions on File Prior to Visit  Medication Sig Dispense Refill  . aspirin 81 MG EC tablet Take 81 mg by mouth daily.        . calcium-vitamin D (OSCAL WITH D) 500-200 MG-UNIT per tablet Take 1 tablet by mouth daily.        . Cholecalciferol (VITAMIN D3) 1000 UNITS tablet Take 1,000 Units by mouth daily.        . cyanocobalamin 1000 MCG tablet Take 500 mcg by mouth daily.        [name] Kitchen amLODipine (NORVASC) 5 MG tablet Take 5 mg by  mouth daily.        [name] Kitchen Besifloxacin HCl (BESIVANCE) 0.6 % SUSP Apply to eye.        . Omega-3 Fatty Acids (OMEGA 3 PO) Take 1 tablet by mouth daily.        [name] Kitchen zolpidem (AMBIEN) 10 MG tablet Take 5-10 mg by mouth at bedtime as needed.           REVIEW OF SYSTEMS: Cardiovascular: No chest pain, chest pressure, palpitations, orthopnea, or dyspnea on exertion. No claudication or rest pain, positive varicose veins and swelling which is minimal Pulmonary: No productive cough, asthma or wheezing. Neurologic: Complaining of her feet going to sleep. No dizziness. Hematologic: No bleeding problems or clotting disorders. Musculoskeletal: No joint pain or joint swelling. Gastrointestinal: No blood in stool or hematemesis Genitourinary: No dysuria or hematuria. Psychiatric:: No history of major depression. Integumentary: No rashes or ulcers. Constitutional: No fever or chills.  PHYSICAL EXAMINATION: General: The patient appears their stated age.  Vital signs are BP 163/88  Pulse 77  Resp 16  Ht 5\' 3"  (1.6 m)  Wt 182 lb 8 oz (82.781 kg)  BMI 32.33 kg/m2  SpO2 97% HEENT:  No gross abnormalities Pulmonary: Respirations are non-labored Abdomen: Soft and non-tender aorta is nonpalpable. Musculoskeletal: There are no major deformities.   Neurologic: No focal weakness or paresthesias are detected, Skin: There are no ulcer or rashes noted. Psychiatric: The patient has normal affect. Cardiovascular: There is a regular rate and rhythm without significant murmur appreciated. Dorsalis pedis pulse is palpable bilaterally, left greater than right. Multiple spider veins in the posterior side of her left leg. no edema  Diagnostic Studies: I have reviewed her outside ultrasound which shows a 4.1 cm abdominal aortic aneurysm  Outside Studies/Documentation Historical records were reviewed.  They showed abdominal aortic aneurysm, diagnosed by x-ray which led to a ultrasound.  Medication  Changes: None  Assessment:  4.1 cm abdominal aortic aneurysm Plan: I have discussed the pathophysiology of abdominal aortic aneurysms with the patient. We have discussed the significance of this finding and how it should be followed in the future. We discussed the indications for repair. We also discussed the symptoms of rupture and to go to the emergency department should they occur. She is scheduled to come back and see me in one year with a repeat abdominal ultrasound     V. Charlena Cross, M.D. [facility] Office: [phone] Pager:  [phone]   

## 2012-02-07 NOTE — Addendum Note (Signed)
Addended by: Sharee Pimple on: 02/07/2012 11:16 AM   Modules accepted: Orders

## 2012-02-08 DIAGNOSIS — M545 Low back pain: Secondary | ICD-10-CM | POA: Diagnosis not present

## 2012-02-08 DIAGNOSIS — M5137 Other intervertebral disc degeneration, lumbosacral region: Secondary | ICD-10-CM | POA: Diagnosis not present

## 2012-02-10 DIAGNOSIS — M545 Low back pain: Secondary | ICD-10-CM | POA: Diagnosis not present

## 2012-02-10 DIAGNOSIS — M5137 Other intervertebral disc degeneration, lumbosacral region: Secondary | ICD-10-CM | POA: Diagnosis not present

## 2012-02-17 DIAGNOSIS — M5137 Other intervertebral disc degeneration, lumbosacral region: Secondary | ICD-10-CM | POA: Diagnosis not present

## 2012-02-17 DIAGNOSIS — M545 Low back pain: Secondary | ICD-10-CM | POA: Diagnosis not present

## 2012-02-22 DIAGNOSIS — M545 Low back pain: Secondary | ICD-10-CM | POA: Diagnosis not present

## 2012-02-22 DIAGNOSIS — IMO0002 Reserved for concepts with insufficient information to code with codable children: Secondary | ICD-10-CM | POA: Diagnosis not present

## 2012-02-22 DIAGNOSIS — M5137 Other intervertebral disc degeneration, lumbosacral region: Secondary | ICD-10-CM | POA: Diagnosis not present

## 2012-02-24 DIAGNOSIS — M5137 Other intervertebral disc degeneration, lumbosacral region: Secondary | ICD-10-CM | POA: Diagnosis not present

## 2012-02-24 DIAGNOSIS — M545 Low back pain: Secondary | ICD-10-CM | POA: Diagnosis not present

## 2012-09-20 DIAGNOSIS — M949 Disorder of cartilage, unspecified: Secondary | ICD-10-CM | POA: Diagnosis not present

## 2012-09-20 DIAGNOSIS — M899 Disorder of bone, unspecified: Secondary | ICD-10-CM | POA: Diagnosis not present

## 2012-09-20 DIAGNOSIS — Z1231 Encounter for screening mammogram for malignant neoplasm of breast: Secondary | ICD-10-CM | POA: Diagnosis not present

## 2012-10-04 NOTE — Progress Notes (Signed)
This encounter was created in error - please disregard.

## 2012-10-05 ENCOUNTER — Encounter: Payer: Self-pay | Admitting: Orthopaedic Surgery

## 2012-10-05 ENCOUNTER — Encounter: Payer: Self-pay | Admitting: Internal Medicine

## 2013-02-05 ENCOUNTER — Ambulatory Visit: Payer: Medicare Other | Admitting: Family

## 2013-11-08 ENCOUNTER — Encounter: Payer: Self-pay | Admitting: Gastroenterology

## 2014-04-05 ENCOUNTER — Other Ambulatory Visit: Payer: Self-pay | Admitting: *Deleted

## 2014-04-05 ENCOUNTER — Encounter: Payer: Self-pay | Admitting: Surgery

## 2014-04-05 DIAGNOSIS — I714 Abdominal aortic aneurysm, without rupture, unspecified: Secondary | ICD-10-CM

## 2014-04-08 ENCOUNTER — Encounter: Payer: Self-pay | Admitting: Surgery

## 2014-04-08 ENCOUNTER — Ambulatory Visit (INDEPENDENT_AMBULATORY_CARE_PROVIDER_SITE_OTHER): Payer: Medicare Other | Admitting: Surgery

## 2014-04-08 ENCOUNTER — Ambulatory Visit (HOSPITAL_COMMUNITY)
Admission: RE | Admit: 2014-04-08 | Discharge: 2014-04-08 | Disposition: A | Discharge status: Home / Self Care | Payer: Medicare Other | Source: Ambulatory Visit | Attending: Surgery | Admitting: Surgery

## 2014-04-08 VITALS — BP 191/73 | HR 65 | Ht 63.0 in | Wt 177.2 lb

## 2014-04-08 DIAGNOSIS — I716 Thoracoabdominal aortic aneurysm, without rupture, unspecified: Secondary | ICD-10-CM

## 2014-04-08 DIAGNOSIS — I714 Abdominal aortic aneurysm, without rupture, unspecified: Secondary | ICD-10-CM | POA: Insufficient documentation

## 2014-04-08 DIAGNOSIS — R29898 Other symptoms and signs involving the musculoskeletal system: Secondary | ICD-10-CM | POA: Diagnosis not present

## 2014-04-08 NOTE — Addendum Note (Signed)
Addended by: Mena Goes on: 04/08/2014 11:38 AM   Modules accepted: Orders

## 2014-04-08 NOTE — Progress Notes (Signed)
Patient name: Margaret Foley MRN: [id number] DOB: Mar 18, 1935 Sex: female     Chief Complaint  Patient presents with  . AAA    AAA  f/u last seen in 2014    HISTORY OF PRESENT ILLNESS: The patient is back today for follow-up of her abdominal aortic aneurysm.  This was initially detected on an ultrasound.  There is a family history of aneurysmal disease in her mother.  She has seen Dr. admission in the past for carotid stenosis, however ultrasound evaluation showed 0-39 percent stenosis bilaterally.  The patient suffers from hypertension and hypercholesterolemia.  She does not take any medication for this because particularly the statins caused a buzzing and ringing in her head.  Her biggest complaint is that of arthritis.  Past Medical History  Diagnosis Date  . Hypertension   . GERD (gastroesophageal reflux disease)   . Hyperlipidemia   . Heart murmur     av sclerosis and mild ar echo 2008  . Chest pain     atypical normal myovue 2008  . Hip pain   . Cough   . Myalgia and myositis, unspecified     statin intolerant  . Peripheral vascular disease   . 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 Tobacco use disorder   . Unspecified vitamin D deficiency   . Other B-complex deficiencies   . Barrett's esophagus   . Osteopenia   . Osteoarthritis   . Lumbago   . CAD (coronary artery disease)   . Insomnia, unspecified   . COPD (chronic obstructive pulmonary disease)   . Carotid artery occlusion   . Cancer     Pre cancerous Uterine Tumor  . Atrial fibrillation     Past Surgical History  Procedure Laterality Date  . Cataract surgery  2011  . Abdominal hysterectomy  1986    Complete  . Joint replacement  2012    Right Shoulder and arm    History   Social History  . Marital Status: Single    Spouse Name: N/A    Number of Children: N/A  . Years of Education: N/A   Occupational History  . retired    Social History Main  Topics  . Smoking status: Current Every Day Smoker -- 0.50 packs/day for 57 years    Types: Cigarettes  . Smokeless tobacco: Not on file     Comment: pt states she is "on again off again" not a constant smoker   . Alcohol Use: No  . Drug Use: No  . Sexual Activity: Not on file   Other Topics Concern  . Not on file   Social History Narrative   Regular exercise--yes daily stretching.    Family History  Problem Relation Age of Onset  . Arthritis Mother   . Heart disease Mother   . Hypertension Mother   . Heart attack Mother   . Hyperlipidemia    . Heart disease Son   . Hypertension Son   . Cancer Father   . Hyperlipidemia Sister   . Cancer Brother     Allergies as of 04/08/2014 - Review Complete 04/08/2014  Allergen Reaction Noted  . Benazepril hcl  07/16/2008  . Carvedilol  02/03/2009  . Indomethacin    . Penicillins    . Pravastatin sodium  07/16/2008  . Rosuvastatin  08/19/2009  . Simvastatin  12/13/2007  . Advil [ibuprofen] Rash 04/08/2014    Current Outpatient Prescriptions on File Prior  to Visit  Medication Sig Dispense Refill  . aspirin 81 MG EC tablet Take 81 mg by mouth daily.      . calcium-vitamin D (OSCAL WITH D) 500-200 MG-UNIT per tablet Take 1 tablet by mouth daily.      . Cholecalciferol (VITAMIN D3) 1000 UNITS tablet Take 1,000 Units by mouth daily.      . cyanocobalamin 1000 MCG tablet Take 500 mcg by mouth daily.      . Omega-3 Fatty Acids (OMEGA 3 PO) Take 1 tablet by mouth daily.      [name] Kitchen amLODipine (NORVASC) 5 MG tablet Take 5 mg by mouth daily.      [name] Kitchen Besifloxacin HCl (BESIVANCE) 0.6 % SUSP Apply to eye.      . zolpidem (AMBIEN) 10 MG tablet Take 5-10 mg by mouth at bedtime as needed.       No current facility-administered medications on file prior to visit.     REVIEW OF SYSTEMS: Cardiovascular: No chest pain, chest pressure, palpitations.  Positive for shortness of breath with exertion and varicose veins Pulmonary: No productive cough,  asthma positive for wheezing. Neurologic: Positive for numbness in her legs and dizziness. Hematologic: No bleeding problems or clotting disorders. Musculoskeletal: No joint pain or joint swelling. Gastrointestinal: No blood in stool or hematemesis Genitourinary: No dysuria or hematuria. Psychiatric:: No history of major depression. Integumentary: No rashes or ulcers. Constitutional: No fever or chills.  PHYSICAL EXAMINATION:   Vital signs are BP 191/73 mmHg  Pulse 65  Ht 5\' 3"  (1.6 m)  Wt 177 lb 3.2 oz (80.377 kg)  BMI 31.40 kg/m2  SpO2 96% General: The patient appears their stated age. HEENT:  No gross abnormalities Pulmonary:  Non labored breathing Abdomen: Soft and non-tender Musculoskeletal: There are no major deformities. Neurologic: No focal weakness or paresthesias are detected, Skin: There are no ulcer or rashes noted. Psychiatric: The patient has normal affect. Cardiovascular: There is a regular rate and rhythm without significant murmur appreciated.  No carotid bruits.  Palpable femoral pulses.   Diagnostic Studies Ultrasound was ordered and reviewed today.  This shows a maximum diameter of 4.6 cm.  Previously in 2013 it measured 4.1  Assessment: Abdominal aortic aneurysm Plan: The patient has had the expected growth in her aneurysm over the course of the past 2 years.  Maximum diameter today is 4.6 cm.  The patient is somewhat concerned about her risk for rupture.  I tried to reassure her.  I will have her come back in 6 months with a CT angiogram of the chest abdomen and pelvis for confirmatory diameter measurements on her aneurysm.  I'll also be able to determine if she is an endovascular candidate.  I will also get Doppler studies of bilateral lower extremities to evaluate her for popliteal aneurysm.  Previously she has had normal carotid exam.  Eldridge Abrahams, M.D. [facility] Office: [phone] Pager:  [phone]

## 2014-05-31 DIAGNOSIS — M545 Low back pain: Secondary | ICD-10-CM | POA: Diagnosis not present

## 2014-05-31 DIAGNOSIS — M546 Pain in thoracic spine: Secondary | ICD-10-CM | POA: Diagnosis not present

## 2014-05-31 DIAGNOSIS — M4806 Spinal stenosis, lumbar region: Secondary | ICD-10-CM | POA: Diagnosis not present

## 2014-05-31 DIAGNOSIS — M47817 Spondylosis without myelopathy or radiculopathy, lumbosacral region: Secondary | ICD-10-CM | POA: Diagnosis not present

## 2014-06-11 DIAGNOSIS — M546 Pain in thoracic spine: Secondary | ICD-10-CM | POA: Diagnosis not present

## 2014-06-11 DIAGNOSIS — M545 Low back pain: Secondary | ICD-10-CM | POA: Diagnosis not present

## 2014-06-11 DIAGNOSIS — M47817 Spondylosis without myelopathy or radiculopathy, lumbosacral region: Secondary | ICD-10-CM | POA: Diagnosis not present

## 2014-06-13 DIAGNOSIS — M545 Low back pain: Secondary | ICD-10-CM | POA: Diagnosis not present

## 2014-06-13 DIAGNOSIS — M47817 Spondylosis without myelopathy or radiculopathy, lumbosacral region: Secondary | ICD-10-CM | POA: Diagnosis not present

## 2014-06-19 DIAGNOSIS — M546 Pain in thoracic spine: Secondary | ICD-10-CM | POA: Diagnosis not present

## 2014-06-19 DIAGNOSIS — M545 Low back pain: Secondary | ICD-10-CM | POA: Diagnosis not present

## 2014-06-19 DIAGNOSIS — Z9181 History of falling: Secondary | ICD-10-CM | POA: Diagnosis not present

## 2014-06-19 DIAGNOSIS — S42291S Other displaced fracture of upper end of right humerus, sequela: Secondary | ICD-10-CM | POA: Diagnosis not present

## 2014-06-19 DIAGNOSIS — M47816 Spondylosis without myelopathy or radiculopathy, lumbar region: Secondary | ICD-10-CM | POA: Diagnosis not present

## 2014-06-19 DIAGNOSIS — M159 Polyosteoarthritis, unspecified: Secondary | ICD-10-CM | POA: Diagnosis not present

## 2014-06-25 DIAGNOSIS — M546 Pain in thoracic spine: Secondary | ICD-10-CM | POA: Diagnosis not present

## 2014-06-25 DIAGNOSIS — S42291S Other displaced fracture of upper end of right humerus, sequela: Secondary | ICD-10-CM | POA: Diagnosis not present

## 2014-06-25 DIAGNOSIS — M159 Polyosteoarthritis, unspecified: Secondary | ICD-10-CM | POA: Diagnosis not present

## 2014-06-25 DIAGNOSIS — M47816 Spondylosis without myelopathy or radiculopathy, lumbar region: Secondary | ICD-10-CM | POA: Diagnosis not present

## 2014-06-25 DIAGNOSIS — M545 Low back pain: Secondary | ICD-10-CM | POA: Diagnosis not present

## 2014-06-25 DIAGNOSIS — Z9181 History of falling: Secondary | ICD-10-CM | POA: Diagnosis not present

## 2014-06-27 DIAGNOSIS — Z9181 History of falling: Secondary | ICD-10-CM | POA: Diagnosis not present

## 2014-06-27 DIAGNOSIS — S42291S Other displaced fracture of upper end of right humerus, sequela: Secondary | ICD-10-CM | POA: Diagnosis not present

## 2014-06-27 DIAGNOSIS — M545 Low back pain: Secondary | ICD-10-CM | POA: Diagnosis not present

## 2014-06-27 DIAGNOSIS — M159 Polyosteoarthritis, unspecified: Secondary | ICD-10-CM | POA: Diagnosis not present

## 2014-06-27 DIAGNOSIS — M47816 Spondylosis without myelopathy or radiculopathy, lumbar region: Secondary | ICD-10-CM | POA: Diagnosis not present

## 2014-06-27 DIAGNOSIS — M546 Pain in thoracic spine: Secondary | ICD-10-CM | POA: Diagnosis not present

## 2014-07-04 DIAGNOSIS — Z9181 History of falling: Secondary | ICD-10-CM | POA: Diagnosis not present

## 2014-07-04 DIAGNOSIS — M159 Polyosteoarthritis, unspecified: Secondary | ICD-10-CM | POA: Diagnosis not present

## 2014-07-04 DIAGNOSIS — M545 Low back pain: Secondary | ICD-10-CM | POA: Diagnosis not present

## 2014-07-04 DIAGNOSIS — S42291S Other displaced fracture of upper end of right humerus, sequela: Secondary | ICD-10-CM | POA: Diagnosis not present

## 2014-07-04 DIAGNOSIS — M546 Pain in thoracic spine: Secondary | ICD-10-CM | POA: Diagnosis not present

## 2014-07-04 DIAGNOSIS — M47816 Spondylosis without myelopathy or radiculopathy, lumbar region: Secondary | ICD-10-CM | POA: Diagnosis not present

## 2014-07-09 DIAGNOSIS — M546 Pain in thoracic spine: Secondary | ICD-10-CM | POA: Diagnosis not present

## 2014-07-09 DIAGNOSIS — M47816 Spondylosis without myelopathy or radiculopathy, lumbar region: Secondary | ICD-10-CM | POA: Diagnosis not present

## 2014-07-09 DIAGNOSIS — M545 Low back pain: Secondary | ICD-10-CM | POA: Diagnosis not present

## 2014-07-09 DIAGNOSIS — M159 Polyosteoarthritis, unspecified: Secondary | ICD-10-CM | POA: Diagnosis not present

## 2014-07-11 DIAGNOSIS — M546 Pain in thoracic spine: Secondary | ICD-10-CM | POA: Diagnosis not present

## 2014-07-11 DIAGNOSIS — M47816 Spondylosis without myelopathy or radiculopathy, lumbar region: Secondary | ICD-10-CM | POA: Diagnosis not present

## 2014-07-11 DIAGNOSIS — M545 Low back pain: Secondary | ICD-10-CM | POA: Diagnosis not present

## 2014-07-11 DIAGNOSIS — M159 Polyosteoarthritis, unspecified: Secondary | ICD-10-CM | POA: Diagnosis not present

## 2014-07-16 DIAGNOSIS — M159 Polyosteoarthritis, unspecified: Secondary | ICD-10-CM | POA: Diagnosis not present

## 2014-07-16 DIAGNOSIS — M545 Low back pain: Secondary | ICD-10-CM | POA: Diagnosis not present

## 2014-07-16 DIAGNOSIS — M47816 Spondylosis without myelopathy or radiculopathy, lumbar region: Secondary | ICD-10-CM | POA: Diagnosis not present

## 2014-07-16 DIAGNOSIS — M546 Pain in thoracic spine: Secondary | ICD-10-CM | POA: Diagnosis not present

## 2014-07-18 DIAGNOSIS — M545 Low back pain: Secondary | ICD-10-CM | POA: Diagnosis not present

## 2014-07-18 DIAGNOSIS — M159 Polyosteoarthritis, unspecified: Secondary | ICD-10-CM | POA: Diagnosis not present

## 2014-07-18 DIAGNOSIS — M47816 Spondylosis without myelopathy or radiculopathy, lumbar region: Secondary | ICD-10-CM | POA: Diagnosis not present

## 2014-07-18 DIAGNOSIS — M546 Pain in thoracic spine: Secondary | ICD-10-CM | POA: Diagnosis not present

## 2014-07-23 DIAGNOSIS — M546 Pain in thoracic spine: Secondary | ICD-10-CM | POA: Diagnosis not present

## 2014-07-23 DIAGNOSIS — M545 Low back pain: Secondary | ICD-10-CM | POA: Diagnosis not present

## 2014-07-23 DIAGNOSIS — M47816 Spondylosis without myelopathy or radiculopathy, lumbar region: Secondary | ICD-10-CM | POA: Diagnosis not present

## 2014-07-23 DIAGNOSIS — M159 Polyosteoarthritis, unspecified: Secondary | ICD-10-CM | POA: Diagnosis not present

## 2014-07-24 DIAGNOSIS — M47816 Spondylosis without myelopathy or radiculopathy, lumbar region: Secondary | ICD-10-CM | POA: Diagnosis not present

## 2014-07-24 DIAGNOSIS — M545 Low back pain: Secondary | ICD-10-CM | POA: Diagnosis not present

## 2014-07-25 DIAGNOSIS — M546 Pain in thoracic spine: Secondary | ICD-10-CM | POA: Diagnosis not present

## 2014-07-25 DIAGNOSIS — M545 Low back pain: Secondary | ICD-10-CM | POA: Diagnosis not present

## 2014-07-25 DIAGNOSIS — M47816 Spondylosis without myelopathy or radiculopathy, lumbar region: Secondary | ICD-10-CM | POA: Diagnosis not present

## 2014-07-25 DIAGNOSIS — M159 Polyosteoarthritis, unspecified: Secondary | ICD-10-CM | POA: Diagnosis not present

## 2014-07-30 DIAGNOSIS — M545 Low back pain: Secondary | ICD-10-CM | POA: Diagnosis not present

## 2014-07-30 DIAGNOSIS — M159 Polyosteoarthritis, unspecified: Secondary | ICD-10-CM | POA: Diagnosis not present

## 2014-07-30 DIAGNOSIS — M546 Pain in thoracic spine: Secondary | ICD-10-CM | POA: Diagnosis not present

## 2014-07-30 DIAGNOSIS — M47816 Spondylosis without myelopathy or radiculopathy, lumbar region: Secondary | ICD-10-CM | POA: Diagnosis not present

## 2014-08-07 DIAGNOSIS — M47816 Spondylosis without myelopathy or radiculopathy, lumbar region: Secondary | ICD-10-CM | POA: Diagnosis not present

## 2014-08-07 DIAGNOSIS — M546 Pain in thoracic spine: Secondary | ICD-10-CM | POA: Diagnosis not present

## 2014-08-07 DIAGNOSIS — M545 Low back pain: Secondary | ICD-10-CM | POA: Diagnosis not present

## 2014-08-07 DIAGNOSIS — M159 Polyosteoarthritis, unspecified: Secondary | ICD-10-CM | POA: Diagnosis not present

## 2014-08-09 DIAGNOSIS — M159 Polyosteoarthritis, unspecified: Secondary | ICD-10-CM | POA: Diagnosis not present

## 2014-08-09 DIAGNOSIS — M47816 Spondylosis without myelopathy or radiculopathy, lumbar region: Secondary | ICD-10-CM | POA: Diagnosis not present

## 2014-08-09 DIAGNOSIS — M546 Pain in thoracic spine: Secondary | ICD-10-CM | POA: Diagnosis not present

## 2014-08-09 DIAGNOSIS — M545 Low back pain: Secondary | ICD-10-CM | POA: Diagnosis not present

## 2014-08-13 DIAGNOSIS — M545 Low back pain: Secondary | ICD-10-CM | POA: Diagnosis not present

## 2014-08-13 DIAGNOSIS — M47816 Spondylosis without myelopathy or radiculopathy, lumbar region: Secondary | ICD-10-CM | POA: Diagnosis not present

## 2014-08-13 DIAGNOSIS — M159 Polyosteoarthritis, unspecified: Secondary | ICD-10-CM | POA: Diagnosis not present

## 2014-08-13 DIAGNOSIS — M546 Pain in thoracic spine: Secondary | ICD-10-CM | POA: Diagnosis not present

## 2014-08-30 DIAGNOSIS — M159 Polyosteoarthritis, unspecified: Secondary | ICD-10-CM | POA: Diagnosis not present

## 2014-08-30 DIAGNOSIS — M545 Low back pain: Secondary | ICD-10-CM | POA: Diagnosis not present

## 2014-08-30 DIAGNOSIS — M47816 Spondylosis without myelopathy or radiculopathy, lumbar region: Secondary | ICD-10-CM | POA: Diagnosis not present

## 2014-08-30 DIAGNOSIS — M546 Pain in thoracic spine: Secondary | ICD-10-CM | POA: Diagnosis not present

## 2014-10-04 ENCOUNTER — Other Ambulatory Visit: Payer: Self-pay | Admitting: Surgery

## 2014-10-04 ENCOUNTER — Encounter: Payer: Self-pay | Admitting: Surgery

## 2014-10-04 ENCOUNTER — Other Ambulatory Visit: Payer: Self-pay

## 2014-10-04 DIAGNOSIS — I714 Abdominal aortic aneurysm, without rupture: Secondary | ICD-10-CM | POA: Diagnosis not present

## 2014-10-04 DIAGNOSIS — I716 Thoracoabdominal aortic aneurysm, without rupture: Secondary | ICD-10-CM | POA: Diagnosis not present

## 2014-10-04 LAB — CREATININE, SERUM: Creat: 0.93 mg/dL (ref 0.50–1.10)

## 2014-10-04 LAB — BUN: BUN: 16 mg/dL (ref 6–23)

## 2014-10-07 ENCOUNTER — Ambulatory Visit (HOSPITAL_COMMUNITY)
Admission: RE | Admit: 2014-10-07 | Discharge: 2014-10-07 | Disposition: A | Discharge status: Home / Self Care | Payer: Medicare Other | Source: Ambulatory Visit | Attending: Surgery | Admitting: Surgery

## 2014-10-07 ENCOUNTER — Ambulatory Visit (INDEPENDENT_AMBULATORY_CARE_PROVIDER_SITE_OTHER)
Admission: RE | Admit: 2014-10-07 | Discharge: 2014-10-07 | Disposition: A | Discharge status: Home / Self Care | Payer: Medicare Other | Source: Ambulatory Visit | Attending: Surgery | Admitting: Surgery

## 2014-10-07 ENCOUNTER — Other Ambulatory Visit: Payer: Self-pay | Admitting: Surgery

## 2014-10-07 ENCOUNTER — Ambulatory Visit
Admission: RE | Admit: 2014-10-07 | Discharge: 2014-10-07 | Disposition: A | Discharge status: Home / Self Care | Payer: Medicare Other | Source: Ambulatory Visit | Attending: Surgery | Admitting: Surgery

## 2014-10-07 ENCOUNTER — Ambulatory Visit (INDEPENDENT_AMBULATORY_CARE_PROVIDER_SITE_OTHER): Payer: Self-pay | Admitting: Surgery

## 2014-10-07 DIAGNOSIS — I716 Thoracoabdominal aortic aneurysm, without rupture, unspecified: Secondary | ICD-10-CM

## 2014-10-07 DIAGNOSIS — I10 Essential (primary) hypertension: Secondary | ICD-10-CM | POA: Diagnosis not present

## 2014-10-07 DIAGNOSIS — I714 Abdominal aortic aneurysm, without rupture, unspecified: Secondary | ICD-10-CM

## 2014-10-07 DIAGNOSIS — R29898 Other symptoms and signs involving the musculoskeletal system: Secondary | ICD-10-CM

## 2014-10-07 DIAGNOSIS — I700 Atherosclerosis of aorta: Secondary | ICD-10-CM | POA: Diagnosis not present

## 2014-10-07 MED ORDER — IOPAMIDOL (ISOVUE-370) INJECTION 76%
75.0000 mL | Freq: Once | INTRAVENOUS | Status: AC | PRN
  Administered 2014-10-07: 75 mL via INTRAVENOUS

## 2014-10-07 NOTE — Progress Notes (Signed)
Patient left without being seen, as her friend who brought her here had to get to the airport.  I discussed her CT scan findings which showed a 4.7 cm aneurysm.  No popliteal aneurysm on duplex.  She needs to follow up in 6 months with a repeat CT scan.  I'm concerned that she may need a fenestrated repair

## 2014-10-07 NOTE — Addendum Note (Signed)
Addended by: Mena Goes on: 10/07/2014 03:35 PM   Modules accepted: Orders

## 2015-04-02 ENCOUNTER — Other Ambulatory Visit: Payer: Self-pay | Admitting: Surgery

## 2015-04-02 DIAGNOSIS — Z01812 Encounter for preprocedural laboratory examination: Secondary | ICD-10-CM | POA: Diagnosis not present

## 2015-04-02 LAB — CREATININE, SERUM: CREATININE: 0.89 mg/dL (ref 0.60–0.93)

## 2015-04-02 LAB — BUN: BUN: 13 mg/dL (ref 7–25)

## 2015-04-07 ENCOUNTER — Ambulatory Visit
Admission: RE | Admit: 2015-04-07 | Discharge: 2015-04-07 | Disposition: A | Discharge status: Home / Self Care | Payer: Medicare Other | Source: Ambulatory Visit | Attending: Surgery | Admitting: Surgery

## 2015-04-07 DIAGNOSIS — I716 Thoracoabdominal aortic aneurysm, without rupture, unspecified: Secondary | ICD-10-CM

## 2015-04-07 DIAGNOSIS — I714 Abdominal aortic aneurysm, without rupture: Secondary | ICD-10-CM | POA: Diagnosis not present

## 2015-04-07 MED ORDER — IOPAMIDOL (ISOVUE-370) INJECTION 76%
75.0000 mL | Freq: Once | INTRAVENOUS | Status: AC | PRN
  Administered 2015-04-07: 75 mL via INTRAVENOUS

## 2015-04-08 ENCOUNTER — Encounter: Payer: Self-pay | Admitting: Surgery

## 2015-04-14 ENCOUNTER — Telehealth: Payer: Self-pay | Admitting: Surgery

## 2015-04-14 ENCOUNTER — Encounter: Payer: Self-pay | Admitting: Surgery

## 2015-04-14 ENCOUNTER — Ambulatory Visit (INDEPENDENT_AMBULATORY_CARE_PROVIDER_SITE_OTHER): Payer: Medicare Other | Admitting: Surgery

## 2015-04-14 VITALS — BP 150/90 | HR 76 | Temp 97.8°F | Resp 18 | Ht 63.0 in | Wt 168.2 lb

## 2015-04-14 DIAGNOSIS — I714 Abdominal aortic aneurysm, without rupture, unspecified: Secondary | ICD-10-CM

## 2015-04-14 NOTE — Progress Notes (Signed)
Filed Vitals:   04/14/15 1308 04/14/15 1313  BP: 148/85 150/90  Pulse: 76   Temp: 97.8 F (36.6 C)   TempSrc: Oral   Resp: 18   Height: 5\' 3"  (1.6 m)   Weight: 168 lb 3.2 oz (76.295 kg)   SpO2: 97%

## 2015-04-14 NOTE — Telephone Encounter (Signed)
notified patient of appt. with dr. Debara Pickett on 04-16-15 at 3:30.  fax cardiac clearance form to dr. Lysbeth Penner office.

## 2015-04-14 NOTE — Progress Notes (Signed)
Patient name: Margaret Foley MRN: [id number] DOB: 20-Sep-1934 Sex: female     Chief Complaint  Patient presents with  . Follow-up    6 month FU  CTA abdomen/pelvis at [place]: The patient is back today for follow-up of her abdominal aortic aneurysm. This was initially detected on an ultrasound. There is a family history of aneurysmal disease in her mother.   She has previously had carotid studies and lower jimmy studies.  There is no popliteal aneurysm carotid studies were normal  The patient suffers from hypertension and hypercholesterolemia. She does not take any medication for this because particularly the statins caused a buzzing and ringing in her head. Her biggest complaint is that of arthritis.  Past Medical History  Diagnosis Date  . Hypertension   . GERD (gastroesophageal reflux disease)   . Hyperlipidemia   . Heart murmur     av sclerosis and mild ar echo 2008  . Chest pain     atypical normal myovue 2008  . Hip pain   . Cough   . Myalgia and myositis, unspecified     statin intolerant  . Peripheral vascular disease ([place])   . 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 Tobacco use disorder   . Unspecified vitamin D deficiency   . Other B-complex deficiencies   . Barrett's esophagus   . Osteopenia   . Osteoarthritis   . Lumbago   . CAD (coronary artery disease)   . Insomnia, unspecified   . COPD (chronic obstructive pulmonary disease) ([place])   . Carotid artery occlusion   . Cancer [facility])     Pre cancerous Uterine Tumor  . Atrial fibrillation [facility])     Past Surgical History  Procedure Laterality Date  . Cataract surgery  2011  . Abdominal hysterectomy  1986    Complete  . Joint replacement  2012    Right Shoulder and arm    Social History   Social History  . Marital Status: Single    Spouse Name: N/A  . Number of Children: N/A  . Years of Education: N/A    Occupational History  . retired    Social History Main Topics  . Smoking status: Current Every Day Smoker -- 0.50 packs/day for 57 years    Types: Cigarettes  . Smokeless tobacco: Not on file     Comment: pt states she is "on again off again" not a constant smoker   . Alcohol Use: No  . Drug Use: No  . Sexual Activity: Not on file   Other Topics Concern  . Not on file   Social History Narrative   Regular exercise--yes daily stretching.    Family History  Problem Relation Age of Onset  . Arthritis Mother   . Heart disease Mother   . Hypertension Mother   . Heart attack Mother   . Hyperlipidemia    . Heart disease Son   . Hypertension Son   . Cancer Father   . Hyperlipidemia Sister   . Cancer Brother     Allergies as of 04/14/2015 - Review Complete 04/14/2015  Allergen Reaction Noted  . Benazepril hcl  07/16/2008  . Carvedilol  02/03/2009  . Indomethacin    . Penicillins    . Pravastatin sodium  07/16/2008  . Rosuvastatin  08/19/2009  . Simvastatin  12/13/2007  . Advil [ibuprofen] Rash 04/08/2014    Current Outpatient  Prescriptions on File Prior to Visit  Medication Sig Dispense Refill  . aspirin 81 MG EC tablet Take 81 mg by mouth daily.      . calcium-vitamin D (OSCAL WITH D) 500-200 MG-UNIT per tablet Take 1 tablet by mouth daily.      . Omega-3 Fatty Acids (OMEGA 3 PO) Take 1 tablet by mouth daily.      [name] Kitchen amLODipine (NORVASC) 5 MG tablet Take 5 mg by mouth daily.      [name] Kitchen Besifloxacin HCl (BESIVANCE) 0.6 % SUSP Apply to eye.      . Cholecalciferol (VITAMIN D3) 1000 UNITS tablet Take 1,000 Units by mouth daily.      . cyanocobalamin 1000 MCG tablet Take 500 mcg by mouth daily.      [name] Kitchen zolpidem (AMBIEN) 10 MG tablet Take 5-10 mg by mouth at bedtime as needed.       No current facility-administered medications on file prior to visit.     REVIEW OF SYSTEMS: Cardiovascular: No chest pain, chest pressure.  No claudication or rest pain,  No history of DVT or  phlebitis. Positive for shortness of breath with activity Pulmonary: No productive cough, asthma or wheezing. Neurologic: No weakness, paresthesias, aphasia, or amaurosis.  Positive for dizziness. Hematologic: No bleeding problems or clotting disorders. Musculoskeletal: No joint pain or joint swelling. Gastrointestinal: No blood in stool or hematemesis Genitourinary: No dysuria or hematuria. Psychiatric:: No history of major depression. Integumentary: No rashes or ulcers. Constitutional: No fever or chills.  PHYSICAL EXAMINATION:   Vital signs are  Filed Vitals:   04/14/15 1308 04/14/15 1313  BP: 148/85 150/90  Pulse: 76   Temp: 97.8 F (36.6 C)   TempSrc: Oral   Resp: 18   Height: 5\' 3"  (1.6 m)   Weight: 168 lb 3.2 oz (76.295 kg)   SpO2: 97%    Body mass index is 29.8 kg/(m^2). General: The patient appears their stated age. HEENT:  No gross abnormalities Pulmonary:  Non labored breathing Abdomen: Soft and non-tender Musculoskeletal: There are no major deformities. Neurologic: No focal weakness or paresthesias are detected, Skin: There are no ulcer or rashes noted. Psychiatric: The patient has normal affect. Cardiovascular: There is a regular rate and rhythm without significant murmur appreciated.   Diagnostic Studies  I have reviewed her  CT and scan which shows a 5.1 cm infrarenal abdominal aortic aneurysm  Assessment:  5.1 cm abdominal aortic aneurysm Plan:  I discussed that because of her size as well as the rapid growth, I think we need to proceed with endovascular repair.  I think this can be done percutaneously.  I described the details of the procedure as well as the risks and benefits.  We described the possibility of cardiopulmonary complications, and, intestinal ischemia , lower extremity ischemia , endoleak , and the need for future interventions as well as follow-up. she  Is on the schedule for Thursday, December 15.     I will also get a formal cardiology  clearance from Dr. Mila Palmer. Leia Alf, M.D. [facility] [place] Office: [phone] Pager:  [phone]

## 2015-04-15 ENCOUNTER — Telehealth: Payer: Self-pay

## 2015-04-15 ENCOUNTER — Other Ambulatory Visit: Payer: Self-pay

## 2015-04-15 NOTE — Telephone Encounter (Signed)
Request for surgical clearance:  1. What type of surgery is being performed? EVAR   2. When is this surgery scheduled? 05/01/2015   3. Are there any medications that need to be held prior to surgery and how long? None specified, but patient is taking aspirin   4. Name of physician performing surgery? Dr Orvan Falconer   5. What is your office phone and fax number?  Phone - [phone] Fax - [phone]

## 2015-04-16 ENCOUNTER — Encounter: Payer: Self-pay | Admitting: Internal Medicine

## 2015-04-16 ENCOUNTER — Ambulatory Visit (INDEPENDENT_AMBULATORY_CARE_PROVIDER_SITE_OTHER): Payer: Medicare Other | Admitting: Internal Medicine

## 2015-04-16 ENCOUNTER — Other Ambulatory Visit: Payer: Self-pay

## 2015-04-16 VITALS — BP 190/104 | HR 82 | Ht 63.0 in | Wt 166.7 lb

## 2015-04-16 DIAGNOSIS — I480 Paroxysmal atrial fibrillation: Secondary | ICD-10-CM | POA: Diagnosis not present

## 2015-04-16 DIAGNOSIS — Z01810 Encounter for preprocedural cardiovascular examination: Secondary | ICD-10-CM | POA: Diagnosis not present

## 2015-04-16 DIAGNOSIS — Z87898 Personal history of other specified conditions: Secondary | ICD-10-CM

## 2015-04-16 DIAGNOSIS — E785 Hyperlipidemia, unspecified: Secondary | ICD-10-CM

## 2015-04-16 DIAGNOSIS — I447 Left bundle-branch block, unspecified: Secondary | ICD-10-CM

## 2015-04-16 DIAGNOSIS — R011 Cardiac murmur, unspecified: Secondary | ICD-10-CM | POA: Diagnosis not present

## 2015-04-16 DIAGNOSIS — I739 Peripheral vascular disease, unspecified: Secondary | ICD-10-CM

## 2015-04-16 DIAGNOSIS — I714 Abdominal aortic aneurysm, without rupture, unspecified: Secondary | ICD-10-CM

## 2015-04-16 DIAGNOSIS — I10 Essential (primary) hypertension: Secondary | ICD-10-CM

## 2015-04-16 NOTE — Patient Instructions (Signed)
Your physician has requested that you have a lexiscan myoview. For further information please visit [email or web address]. Please follow instruction sheet, as given.   Your physician recommends that you schedule a follow-up appointment pending test resuilts.

## 2015-04-16 NOTE — Progress Notes (Signed)
OFFICE NOTE  Chief Complaint:  Preoperative cardiovascular risk assessment  Primary Care Physician: Margaret Kehr, MD  HPI:  Margaret Foley is a pleasant 80 year old female who previously saw Dr. Johnsie Foley in 2012. Her past medical history significant for hypertension, dyslipidemia, possible remote paroxysmal atrial fibrillation, but she denies ever being on anticoagulation. In 2012 she met with Dr. Johnsie Foley prior to shoulder surgery after she "destroyed" her right shoulder. This was repaired by Dr. Durward Foley. Subsequent follow-up identified in the abdominal aortic aneurysm. This was followed and recently has been increasing in size. She is seeing Dr. Trula Foley who recommends an EVAR. She was referred to me for preoperative cardiovascular risk assessment as she is considered a new patient, since her last cardiology visit was in 2012. She reports being asymptomatic, denying any chest pain or shortness of breath. She doesn't occasional light yard work including raking leaves which does cause her some fatigue but no significant shortness of breath. Of note her EKG today is abnormal demonstrating sinus rhythm with a left bundle branch block. This is a new finding compared to her last EKG in 2012. Family history is significant for heart disease in both mother and grandmother as well as her grandfather. She has a history of smoking which she continues to do. She smoked for about 60 years and about 1-2 packs per day. Of note, her blood pressure is poorly controlled today at 190/104.  PMHx:  Past Medical History  Diagnosis Date  . Hypertension   . GERD (gastroesophageal reflux disease)   . Hyperlipidemia   . Heart murmur     av sclerosis and mild ar echo 2008  . Chest pain     atypical normal myovue 2008  . Hip pain   . Cough   . Myalgia and myositis, unspecified     statin intolerant  . Peripheral vascular disease ([place])   . Occlusion and stenosis of vertebral artery without mention of cerebral  infarction   . Tobacco use disorder   . Unspecified vitamin D deficiency   . Other B-complex deficiencies   . Barrett's esophagus   . Osteopenia   . Osteoarthritis   . Lumbago   . CAD (coronary artery disease)   . Insomnia, unspecified   . COPD (chronic obstructive pulmonary disease) ([place])   . Carotid artery occlusion   . Cancer [facility])     Pre cancerous Uterine Tumor  . Atrial fibrillation [facility])     Past Surgical History  Procedure Laterality Date  . Cataract surgery  2011  . Abdominal hysterectomy  1986    Complete  . Joint replacement  2012    Right Shoulder and arm    FAMHx:  Family History  Problem Relation Age of Onset  . Arthritis Mother   . Heart disease Mother   . Hypertension Mother   . Heart attack Mother   . Hyperlipidemia    . Heart disease Son   . Hypertension Son   . Cancer Father   . Hyperlipidemia Sister   . Cancer Brother     SOCHx:   reports that she has been smoking Cigarettes.  She has a 28.5 pack-year smoking history. She has never used smokeless tobacco. She reports that she does not drink alcohol or use illicit drugs.  ALLERGIES:  Allergies  Allergen Reactions  . Benazepril Hcl     REACTION: cough  . Carvedilol     REACTION: ear buzzing ??  . Indomethacin   . Penicillins   .  Pravastatin Sodium     REACTION: achy  . Rosuvastatin     REACTION: buzzing  . Simvastatin     REACTION: myalgia  . Advil [Ibuprofen] Rash    ROS: A comprehensive review of systems was negative.  HOME MEDS: Current Outpatient Prescriptions  Medication Sig Dispense Refill  . Ascorbic Acid (VITAMIN C PO) Take 1,200 mg by mouth daily.    [name] Kitchen aspirin 81 MG EC tablet Take 81 mg by mouth daily.      . calcium-vitamin D (OSCAL WITH D) 500-200 MG-UNIT per tablet Take 1 tablet by mouth daily.      . Cholecalciferol (VITAMIN D3) 1000 UNITS tablet Take 1,000 Units by mouth daily.      . cyanocobalamin 1000 MCG tablet Take 500 mcg by mouth daily.      . Multiple  Vitamins-Minerals (MULTIVITAMIN WITH MINERALS) tablet Take 1 tablet by mouth daily.    . Omega-3 Fatty Acids (OMEGA 3 PO) Take 1 tablet by mouth daily.       No current facility-administered medications for this visit.    LABS/IMAGING: No results found for this or any previous visit (from the past 48 hour(s)). No results found.  WEIGHTS: Wt Readings from Last 3 Encounters:  04/16/15 166 lb 11.2 oz (75.615 kg)  04/14/15 168 lb 3.2 oz (76.295 kg)  04/08/14 177 lb 3.2 oz (80.377 kg)    VITALS: BP 190/104 mmHg  Pulse 82  Ht '5\' 3"'  (1.6 m)  Wt 166 lb 11.2 oz (75.615 kg)  BMI 29.54 kg/m2  EXAM: General appearance: alert and no distress Neck: no carotid bruit and no JVD Lungs: clear to auscultation bilaterally Heart: regular rate and rhythm, S1, S2 normal, no murmur, click, rub or gallop Abdomen: soft, non-tender; bowel sounds normal; no masses,  no organomegaly Extremities: extremities normal, atraumatic, no cyanosis or edema Pulses: 2+ and symmetric Skin: Skin color, texture, turgor normal. No rashes or lesions Neurologic: Grossly normal Psych: Pleasant  EKG: Normal sinus rhythm at 82, left bundle branch block  ASSESSMENT: 1. Indeterminate preoperative risk for high risk EVAR surgery 2. Newly identified left bundle branch block 3. Possible remote history of PAF 4. Uncontrolled hypertension  PLAN: 1.   Margaret Foley may be at increased risk for endovascular repair of her abdominal aortic aneurysm. She has a new leak identified left bundle branch block. Based on these changes in her family history of heart disease with uncontrolled hypertension, I'm recommending a Lexiscan nuclear stress test for preoperative risk assessment prior to surgery. Hopefully we can get that done shortly as her surgery is scheduled on December 15. In addition, her blood pressure remains elevated. She said that she had not taken all of her medicines today. We will need to reassess this and she may need  additional medicine prior to surgery.  I will be in contact with her when we have results of her stress test. Thanks for the kind referral.  Margaret Casino, MD, [facility] Attending Cardiologist [place] 04/16/2015, 5:02 PM

## 2015-04-16 NOTE — Telephone Encounter (Signed)
Patient has not been seen in 4 years will arrange office evaluation but history from 4 years ago benign from cardiac perspective

## 2015-04-17 NOTE — Telephone Encounter (Signed)
Colletta [place] called in just wanting to inform Dr. Debara Pickett that the pt's surgery has changed from an Endovascular AAA repair to and Open AAA repair. If you need to f/u with her, please do.  Thanks

## 2015-04-17 NOTE — Telephone Encounter (Signed)
PT  SAW DR HILTY  ON 04-16-15  WHOM ORDERED  MYOVIEW  FOR PRE OP  CLEARANCE  PT  HAVING  TEST ON 04-23-15 ./CY

## 2015-04-18 ENCOUNTER — Telehealth (HOSPITAL_COMMUNITY): Payer: Self-pay

## 2015-04-18 NOTE — Telephone Encounter (Signed)
Encounter complete. 

## 2015-04-22 ENCOUNTER — Encounter: Payer: Self-pay | Admitting: Surgery

## 2015-04-23 ENCOUNTER — Ambulatory Visit (HOSPITAL_COMMUNITY)
Admission: RE | Admit: 2015-04-23 | Discharge: 2015-04-23 | Disposition: A | Discharge status: Home / Self Care | Payer: Medicare Other | Source: Ambulatory Visit | Attending: Cardiovascular Disease | Admitting: Cardiovascular Disease

## 2015-04-23 DIAGNOSIS — Z87898 Personal history of other specified conditions: Secondary | ICD-10-CM | POA: Diagnosis not present

## 2015-04-23 DIAGNOSIS — R9439 Abnormal result of other cardiovascular function study: Secondary | ICD-10-CM | POA: Diagnosis not present

## 2015-04-23 DIAGNOSIS — R011 Cardiac murmur, unspecified: Secondary | ICD-10-CM | POA: Diagnosis not present

## 2015-04-23 DIAGNOSIS — I739 Peripheral vascular disease, unspecified: Secondary | ICD-10-CM | POA: Insufficient documentation

## 2015-04-23 DIAGNOSIS — I779 Disorder of arteries and arterioles, unspecified: Secondary | ICD-10-CM | POA: Insufficient documentation

## 2015-04-23 DIAGNOSIS — Z8249 Family history of ischemic heart disease and other diseases of the circulatory system: Secondary | ICD-10-CM | POA: Diagnosis not present

## 2015-04-23 DIAGNOSIS — F17200 Nicotine dependence, unspecified, uncomplicated: Secondary | ICD-10-CM | POA: Insufficient documentation

## 2015-04-23 DIAGNOSIS — I447 Left bundle-branch block, unspecified: Secondary | ICD-10-CM | POA: Diagnosis not present

## 2015-04-23 DIAGNOSIS — R42 Dizziness and giddiness: Secondary | ICD-10-CM | POA: Insufficient documentation

## 2015-04-23 DIAGNOSIS — Z01810 Encounter for preprocedural cardiovascular examination: Secondary | ICD-10-CM | POA: Diagnosis not present

## 2015-04-23 DIAGNOSIS — I10 Essential (primary) hypertension: Secondary | ICD-10-CM | POA: Insufficient documentation

## 2015-04-23 LAB — MYOCARDIAL PERFUSION IMAGING
CHL CUP NUCLEAR SSS: 15
CHL CUP STRESS STAGE 1 DBP: 72 mmHg
CHL CUP STRESS STAGE 1 SPEED: 0 mph
CHL CUP STRESS STAGE 2 GRADE: 0 %
CHL CUP STRESS STAGE 3 GRADE: 0 %
CHL CUP STRESS STAGE 3 HR: 68 {beats}/min
CHL CUP STRESS STAGE 3 SPEED: 0 mph
CHL CUP STRESS STAGE 4 HR: 61 {beats}/min
CSEPEW: 1 METS
CSEPPHR: 68 {beats}/min
CSEPPMHR: 47 %
LV dias vol: 86 mL
LVSYSVOL: 39 mL
Rest HR: 56 {beats}/min
SDS: 6
SRS: 9
Stage 1 Grade: 0 %
Stage 1 HR: 58 {beats}/min
Stage 1 SBP: 167 mmHg
Stage 2 HR: 58 {beats}/min
Stage 2 Speed: 0 mph
Stage 4 Grade: 0 %
Stage 4 Speed: 0 mph
TID: 1.02

## 2015-04-23 MED ORDER — REGADENOSON 0.4 MG/5ML IV SOLN
0.4000 mg | Freq: Once | INTRAVENOUS | Status: AC
  Administered 2015-04-23: 0.4 mg via INTRAVENOUS

## 2015-04-23 MED ORDER — AMINOPHYLLINE 25 MG/ML IV SOLN
75.0000 mg | Freq: Once | INTRAVENOUS | Status: AC
  Administered 2015-04-23: 75 mg via INTRAVENOUS

## 2015-04-23 MED ORDER — TECHNETIUM TC 99M SESTAMIBI GENERIC - CARDIOLITE
30.6000 | Freq: Once | INTRAVENOUS | Status: AC | PRN
  Administered 2015-04-23: 31 via INTRAVENOUS

## 2015-04-23 MED ORDER — TECHNETIUM TC 99M SESTAMIBI GENERIC - CARDIOLITE
10.9000 | Freq: Once | INTRAVENOUS | Status: AC | PRN
  Administered 2015-04-23: 10.9 via INTRAVENOUS

## 2015-04-28 ENCOUNTER — Encounter: Payer: Self-pay | Admitting: Surgery

## 2015-04-28 ENCOUNTER — Ambulatory Visit (INDEPENDENT_AMBULATORY_CARE_PROVIDER_SITE_OTHER): Payer: Medicare Other | Admitting: Surgery

## 2015-04-28 VITALS — BP 157/80 | HR 62 | Ht 63.0 in | Wt 170.1 lb

## 2015-04-28 DIAGNOSIS — I714 Abdominal aortic aneurysm, without rupture, unspecified: Secondary | ICD-10-CM

## 2015-04-28 NOTE — Progress Notes (Signed)
Patient name: Margaret Foley MRN: [id number] DOB: 08/31/1934 Sex: female     Chief Complaint  Patient presents with  . Re-evaluation    discuss surgery     HISTORY OF PRESENT ILLNESS: The patient comes in today for further discussions regarding her abdominal aortic aneurysm.  Past Medical History  Diagnosis Date  . Hypertension   . GERD (gastroesophageal reflux disease)   . Hyperlipidemia   . Heart murmur     av sclerosis and mild ar echo 2008  . Chest pain     atypical normal myovue 2008  . Hip pain   . Cough   . Myalgia and myositis, unspecified     statin intolerant  . Peripheral vascular disease ([place])   . 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 Tobacco use disorder   . Unspecified vitamin D deficiency   . Other B-complex deficiencies   . Barrett's esophagus   . Osteopenia   . Osteoarthritis   . Lumbago   . CAD (coronary artery disease)   . Insomnia, unspecified   . COPD (chronic obstructive pulmonary disease) ([place])   . Carotid artery occlusion   . Cancer [facility])     Pre cancerous Uterine Tumor  . Atrial fibrillation [facility])     Past Surgical History  Procedure Laterality Date  . Cataract surgery  2011  . Abdominal hysterectomy  1986    Complete  . Joint replacement  2012    Right Shoulder and arm    Social History   Social History  . Marital Status: Single    Spouse Name: N/A  . Number of Children: N/A  . Years of Education: N/A   Occupational History  . retired    Social History Main Topics  . Smoking status: Current Every Day Smoker -- 0.50 packs/day for 57 years    Types: Cigarettes  . Smokeless tobacco: Never Used     Comment: pt states she is "on again off again" not a constant smoker   . Alcohol Use: No  . Drug Use: No  . Sexual Activity: Not on file   Other Topics Concern  . Not on file   Social History Narrative   Regular exercise--yes daily stretching.    Family History    Problem Relation Age of Onset  . Arthritis Mother   . Heart disease Mother   . Hypertension Mother   . Heart attack Mother   . Hyperlipidemia    . Heart disease Son   . Hypertension Son   . Cancer Father   . Hyperlipidemia Sister   . Cancer Brother     Allergies as of 04/28/2015 - Review Complete 04/28/2015  Allergen Reaction Noted  . Benazepril hcl  07/16/2008  . Carvedilol  02/03/2009  . Indomethacin    . Penicillins    . Pravastatin sodium  07/16/2008  . Rosuvastatin  08/19/2009  . Simvastatin  12/13/2007  . Advil [ibuprofen] Rash 04/08/2014    Current Outpatient Prescriptions on File Prior to Visit  Medication Sig Dispense Refill  . Ascorbic Acid (VITAMIN C PO) Take 1,200 mg by mouth daily.    [name] Kitchen aspirin 81 MG EC tablet Take 81 mg by mouth daily.      . calcium-vitamin D (OSCAL WITH D) 500-200 MG-UNIT per tablet Take 1 tablet by mouth daily.      . Cholecalciferol (VITAMIN D3) 1000 UNITS tablet Take 1,000 Units by mouth daily.      [name] Kitchen  cyanocobalamin 1000 MCG tablet Take 500 mcg by mouth daily.      . Multiple Vitamins-Minerals (MULTIVITAMIN WITH MINERALS) tablet Take 1 tablet by mouth daily.    . Omega-3 Fatty Acids (OMEGA 3 PO) Take 1 tablet by mouth daily.       No current facility-administered medications on file prior to visit.     REVIEW OF SYSTEMS: No changes from prior visit  PHYSICAL EXAMINATION:   Vital signs are  Filed Vitals:   04/28/15 0840 04/28/15 0842  BP: 156/83 157/80  Pulse: 62   Height: 5\' 3"  (1.6 m)   Weight: 170 lb 1.6 oz (77.157 kg)   SpO2: 96%    Body mass index is 30.14 kg/(m^2). General: The patient appears their stated age. HEENT:  No gross abnormalities Pulmonary:  Non labored breathing Musculoskeletal: There are no major deformities. Neurologic: No focal weakness or paresthesias are detected, Skin: There are no ulcer or rashes noted. Psychiatric: The patient has normal affect.    Diagnostic  Studies None  Assessment: Abdominal aortic aneurysm Plan: The patient is accompanied with her son today.  I brought the bed because after additional review of her CT scan, I think that she is a better candidate for an open operation rather than a endovascular procedure.  I am concerned about the amount of thrombus in her infrarenal neck as well as the length of the infrarenal neck.  I discussed the details of an open operation including the risk including the risk of intestinal ischemia, lower extremity ischemia, cardiopulmonary complications, death, wound issues.  All other questions were answered.  She is on the schedule for this Thursday.  She had a low risk Myoview study  V. Leia Alf, M.D. [facility] Office: [phone] Pager:  [phone]

## 2015-04-28 NOTE — Pre-Procedure Instructions (Signed)
Jahmiah T Gerry  04/28/2015      [facility] - [place], [address] [place] [address] Phone: [phone] Fax: [phone]    Your procedure is scheduled on Dec 15.  Report to [facility] at  [employer]M.  Call this number if you have problems the morning of surgery:  [phone]   Remember:  Do not eat food or drink liquids after midnight.  Take these medicines the morning of surgery with A SIP OF WATER na  Stop taking Aspirin, Ibuprofen BC's, Goody's, Fish Oil, Herbal medications, Aleve   Do not wear jewelry, make-up or nail polish.  Do not wear lotions, powders, or perfumes.  You may wear deodorant.  Do not shave 48 hours prior to surgery.  Men may shave face and neck.  Do not bring valuables to the hospital.  [facility] is not responsible for any belongings or valuables.  Contacts, dentures or bridgework may not be worn into surgery.  Leave your suitcase in the car.  After surgery it may be brought to your room.  For patients admitted to the hospital, discharge time will be determined by your treatment team.  Patients discharged the day of surgery will not be allowed to drive home.    Special instructions:  [facility] - Preparing for Surgery  Before surgery, you can play an important role.  Because skin is not sterile, your skin needs to be as free of germs as possible.  You can reduce the number of germs on you skin by washing with CHG (chlorahexidine gluconate) soap before surgery.  CHG is an antiseptic cleaner which kills germs and bonds with the skin to continue killing germs even after washing.  Please DO NOT use if you have an allergy to CHG or antibacterial soaps.  If your skin becomes reddened/irritated stop using the CHG and inform your nurse when you arrive at Short Stay.  Do not shave (including legs and underarms) for at least 48 hours prior to the first CHG shower.  You may  shave your face.  Please follow these instructions carefully:   1.  Shower with CHG Soap the night before surgery and the   morning of Surgery.  2.  If you choose to wash your hair, wash your hair first as usual with your normal shampoo.  3.  After you shampoo, rinse your hair and body thoroughly to remove the   Shampoo.  4.  Use CHG as you would any other liquid soap.  You can apply chg directly  to the skin and wash gently with scrungie or a clean washcloth.  5.  Apply the CHG Soap to your body ONLY FROM THE NECK DOWN.        Do not use on open wounds or open sores.  Avoid contact with your eyes,   ears, mouth and genitals (private parts).  Wash genitals (private parts)  with your normal soap.  6.  Wash thoroughly, paying special attention to the area where your surgery will be performed.  7.  Thoroughly rinse your body with warm water from the neck down.  8.  DO NOT shower/wash with your normal soap after using and rinsing off  the CHG Soap.  9.  Pat yourself dry with a clean towel.            10.  Wear clean pajamas.  11.  Place clean sheets on your bed the night of your first shower and do not sleep with pets.  Day of Surgery  Do not apply any lotions/deoderants the morning of surgery.  Please wear clean clothes to the hospital/surgery center.     Please read over the following fact sheets that you were given. Pain Booklet, Coughing and Deep Breathing, Blood Transfusion Information, MRSA Information and Surgical Site Infection Prevention

## 2015-04-29 ENCOUNTER — Encounter (HOSPITAL_COMMUNITY)
Admission: RE | Admit: 2015-04-29 | Discharge: 2015-04-29 | Disposition: A | Discharge status: Home / Self Care | Payer: Medicare Other | Source: Ambulatory Visit | Attending: Surgery | Admitting: Surgery

## 2015-04-29 ENCOUNTER — Encounter (HOSPITAL_COMMUNITY): Payer: Self-pay

## 2015-04-29 ENCOUNTER — Other Ambulatory Visit: Payer: Self-pay

## 2015-04-29 DIAGNOSIS — I714 Abdominal aortic aneurysm, without rupture: Secondary | ICD-10-CM | POA: Diagnosis not present

## 2015-04-29 DIAGNOSIS — D696 Thrombocytopenia, unspecified: Secondary | ICD-10-CM | POA: Diagnosis not present

## 2015-04-29 DIAGNOSIS — I4891 Unspecified atrial fibrillation: Secondary | ICD-10-CM | POA: Diagnosis not present

## 2015-04-29 DIAGNOSIS — N170 Acute kidney failure with tubular necrosis: Secondary | ICD-10-CM | POA: Diagnosis not present

## 2015-04-29 HISTORY — DX: Personal history of other diseases of the digestive system: Z87.19

## 2015-04-29 HISTORY — DX: Depression, unspecified: F32.A

## 2015-04-29 HISTORY — DX: Calculus of kidney: N20.0

## 2015-04-29 HISTORY — DX: Fibromyalgia: M79.7

## 2015-04-29 HISTORY — DX: Reserved for inherently not codable concepts without codable children: IMO0001

## 2015-04-29 HISTORY — DX: Anxiety disorder, unspecified: F41.9

## 2015-04-29 HISTORY — DX: Major depressive disorder, single episode, unspecified: F32.9

## 2015-04-29 LAB — URINE MICROSCOPIC-ADD ON

## 2015-04-29 LAB — BLOOD GAS, ARTERIAL
ACID-BASE DEFICIT: 1.1 mmol/L (ref 0.0–2.0)
Bicarbonate: 23 mEq/L (ref 20.0–24.0)
DRAWN BY: 206361
FIO2: 0.21
O2 Saturation: 95.8 %
Patient temperature: 98.6
TCO2: 24.2 mmol/L (ref 0–100)
pCO2 arterial: 38.3 mmHg (ref 35.0–45.0)
pH, Arterial: 7.396 (ref 7.350–7.450)
pO2, Arterial: 78.5 mmHg — ABNORMAL LOW (ref 80.0–100.0)

## 2015-04-29 LAB — COMPREHENSIVE METABOLIC PANEL
ALT: 14 U/L (ref 14–54)
AST: 21 U/L (ref 15–41)
Albumin: 3.7 g/dL (ref 3.5–5.0)
Alkaline Phosphatase: 77 U/L (ref 38–126)
Anion gap: 8 (ref 5–15)
BUN: 11 mg/dL (ref 6–20)
CHLORIDE: 111 mmol/L (ref 101–111)
CO2: 22 mmol/L (ref 22–32)
CREATININE: 0.86 mg/dL (ref 0.44–1.00)
Calcium: 9.2 mg/dL (ref 8.9–10.3)
GFR calc non Af Amer: >60 mL/min (ref >60)
Glucose, Bld: 98 mg/dL (ref 65–99)
Potassium: 3.6 mmol/L (ref 3.5–5.1)
SODIUM: 141 mmol/L (ref 135–145)
Total Bilirubin: 0.5 mg/dL (ref 0.3–1.2)
Total Protein: 6.5 g/dL (ref 6.5–8.1)

## 2015-04-29 LAB — URINALYSIS, ROUTINE W REFLEX MICROSCOPIC
Bilirubin Urine: NEGATIVE
Glucose, UA: NEGATIVE mg/dL
Ketones, ur: NEGATIVE mg/dL
NITRITE: NEGATIVE
PH: 5.5 (ref 5.0–8.0)
Protein, ur: NEGATIVE mg/dL

## 2015-04-29 LAB — CBC
HCT: 41.1 % (ref 36.0–46.0)
Hemoglobin: 13.2 g/dL (ref 12.0–15.0)
MCH: 28.8 pg (ref 26.0–34.0)
MCHC: 32.1 g/dL (ref 30.0–36.0)
MCV: 89.5 fL (ref 78.0–100.0)
PLATELETS: 186 10*3/uL (ref 150–400)
RBC: 4.59 MIL/uL (ref 3.87–5.11)
RDW: 14.8 % (ref 11.5–15.5)
WBC: 5.1 10*3/uL (ref 4.0–10.5)

## 2015-04-29 LAB — PROTIME-INR
INR: 1.05 (ref 0.00–1.49)
Prothrombin Time: 13.9 seconds (ref 11.6–15.2)

## 2015-04-29 LAB — SURGICAL PCR SCREEN
MRSA, PCR: NEGATIVE
STAPHYLOCOCCUS AUREUS: NEGATIVE

## 2015-04-29 LAB — APTT: aPTT: 31 seconds (ref 24–37)

## 2015-04-29 NOTE — Progress Notes (Signed)
PCP - Dr Alain Marion  Cardiologist- states she usually sees Dr. Johnsie Cancel, but saw Dr Debara Pickett last time Low Risk stress test in epic from12-11-2014 Denies ever having a card cath or echo.

## 2015-04-30 ENCOUNTER — Encounter (HOSPITAL_COMMUNITY): Payer: Self-pay | Admitting: Anesthesiology

## 2015-04-30 ENCOUNTER — Encounter (HOSPITAL_COMMUNITY): Payer: Self-pay | Admitting: Emergency Medicine

## 2015-04-30 MED ORDER — VANCOMYCIN HCL IN DEXTROSE 1-5 GM/200ML-% IV SOLN
1000.0000 mg | INTRAVENOUS | Status: DC
  Filled 2015-04-30: qty 200

## 2015-04-30 MED ORDER — CHLORHEXIDINE GLUCONATE CLOTH 2 % EX PADS: 6.0000 | MEDICATED_PAD | Freq: Once | CUTANEOUS | Status: DC

## 2015-04-30 MED ORDER — SODIUM CHLORIDE 0.9 % IV SOLN: INTRAVENOUS | Status: DC

## 2015-04-30 NOTE — Progress Notes (Addendum)
Anesthesia Chart Review:  Pt is 80 year old female scheduled for open AAA repair on 05/01/2015 with Dr. Trula Slade.   PMH includes:  CAD (I can find nothing to support this dx in her hx), HTN, PVD, heart murmur (AR), atrial fibrillation (remote hx), carotid artery occlusion, hyperlipidemia, GERD. Current smoker. BMI 29.   Medications include: ASA.   Preoperative labs reviewed.    EKG 04/29/15: NSR. Left axis deviation. LBBB. This is unchanged compared with prior tracing 04/16/15 that was obtained prior to stress test (see below).   Nuclear stress test 04/23/15:  1. Low risk study 2. BBB artifact, nl LV fxn  CT angio abd/pelvis 04/07/15:  - Infrarenal abdominal aortic aneurysm has minimally enlarged in the AP dimension now measuring up to 5.1 cm. - No acute abnormality in the abdomen or pelvis.  Carotid duplex 03/18/11: 0-39% bilateral disease   Echo 12/23/06:  - Overall left ventricular systolic function was normal. Left ventricular ejection fraction was estimated to be 65 %. There were no left ventricular regional wall motion abnormalities. - There was mild aortic valvular regurgitation.  Pt saw Dr. Debara Pickett with cardiology for pre-op eval 04/16/15. He ordered stress test (results above). Given normal stress test and absence of concerning sx at PAT, I anticipate pt can proceed as scheduled.   Willeen Cass, FNP-BC [facility]/Anesthesiology Phone: [phone] 04/30/2015 9:45 AM

## 2015-05-01 ENCOUNTER — Inpatient Hospital Stay (HOSPITAL_COMMUNITY)
Admission: RE | Admit: 2015-05-01 | Discharge: 2015-05-08 | DRG: 268 | Disposition: A | Discharge status: Skilled Nursing Facility | Payer: Medicare Other | Source: Ambulatory Visit | Attending: Surgery | Admitting: Surgery

## 2015-05-01 ENCOUNTER — Encounter (HOSPITAL_COMMUNITY): Payer: Self-pay | Admitting: Anesthesiology

## 2015-05-01 DIAGNOSIS — Z9889 Other specified postprocedural states: Secondary | ICD-10-CM

## 2015-05-01 DIAGNOSIS — I4891 Unspecified atrial fibrillation: Secondary | ICD-10-CM | POA: Diagnosis not present

## 2015-05-01 DIAGNOSIS — I714 Abdominal aortic aneurysm, without rupture, unspecified: Secondary | ICD-10-CM | POA: Diagnosis present

## 2015-05-01 DIAGNOSIS — M6281 Muscle weakness (generalized): Secondary | ICD-10-CM | POA: Diagnosis not present

## 2015-05-01 DIAGNOSIS — J449 Chronic obstructive pulmonary disease, unspecified: Secondary | ICD-10-CM | POA: Diagnosis present

## 2015-05-01 DIAGNOSIS — I10 Essential (primary) hypertension: Secondary | ICD-10-CM | POA: Diagnosis present

## 2015-05-01 DIAGNOSIS — R918 Other nonspecific abnormal finding of lung field: Secondary | ICD-10-CM | POA: Diagnosis not present

## 2015-05-01 DIAGNOSIS — K529 Noninfective gastroenteritis and colitis, unspecified: Secondary | ICD-10-CM | POA: Diagnosis not present

## 2015-05-01 DIAGNOSIS — Z96611 Presence of right artificial shoulder joint: Secondary | ICD-10-CM | POA: Diagnosis present

## 2015-05-01 DIAGNOSIS — Z96698 Presence of other orthopedic joint implants: Secondary | ICD-10-CM | POA: Diagnosis present

## 2015-05-01 DIAGNOSIS — I719 Aortic aneurysm of unspecified site, without rupture: Secondary | ICD-10-CM | POA: Diagnosis not present

## 2015-05-01 DIAGNOSIS — L600 Ingrowing nail: Secondary | ICD-10-CM | POA: Diagnosis present

## 2015-05-01 DIAGNOSIS — K219 Gastro-esophageal reflux disease without esophagitis: Secondary | ICD-10-CM | POA: Diagnosis present

## 2015-05-01 DIAGNOSIS — N170 Acute kidney failure with tubular necrosis: Secondary | ICD-10-CM | POA: Diagnosis not present

## 2015-05-01 DIAGNOSIS — M797 Fibromyalgia: Secondary | ICD-10-CM | POA: Diagnosis not present

## 2015-05-01 DIAGNOSIS — Z7982 Long term (current) use of aspirin: Secondary | ICD-10-CM | POA: Diagnosis not present

## 2015-05-01 DIAGNOSIS — Z4789 Encounter for other orthopedic aftercare: Secondary | ICD-10-CM | POA: Diagnosis not present

## 2015-05-01 DIAGNOSIS — R262 Difficulty in walking, not elsewhere classified: Secondary | ICD-10-CM | POA: Diagnosis not present

## 2015-05-01 DIAGNOSIS — D696 Thrombocytopenia, unspecified: Secondary | ICD-10-CM | POA: Diagnosis not present

## 2015-05-01 DIAGNOSIS — F17210 Nicotine dependence, cigarettes, uncomplicated: Secondary | ICD-10-CM | POA: Diagnosis present

## 2015-05-01 DIAGNOSIS — I2510 Atherosclerotic heart disease of native coronary artery without angina pectoris: Secondary | ICD-10-CM | POA: Diagnosis present

## 2015-05-01 DIAGNOSIS — E785 Hyperlipidemia, unspecified: Secondary | ICD-10-CM | POA: Diagnosis present

## 2015-05-01 DIAGNOSIS — I739 Peripheral vascular disease, unspecified: Secondary | ICD-10-CM | POA: Diagnosis present

## 2015-05-01 DIAGNOSIS — R109 Unspecified abdominal pain: Secondary | ICD-10-CM | POA: Diagnosis not present

## 2015-05-01 MED ORDER — ADULT MULTIVITAMIN W/MINERALS CH: 1.0000 | ORAL_TABLET | Freq: Every day | ORAL | Status: DC

## 2015-05-01 MED ORDER — ROCURONIUM BROMIDE 50 MG/5ML IV SOLN
INTRAVENOUS | Status: AC
  Filled 2015-05-01: qty 1

## 2015-05-01 MED ORDER — SODIUM CHLORIDE 0.9 % IV SOLN: 250.0000 mL | INTRAVENOUS | Status: DC | PRN

## 2015-05-01 MED ORDER — MIDAZOLAM HCL 2 MG/2ML IJ SOLN
INTRAMUSCULAR | Status: AC
  Administered 2015-05-01: 1 mg
  Filled 2015-05-01: qty 2

## 2015-05-01 MED ORDER — FENTANYL CITRATE (PF) 100 MCG/2ML IJ SOLN
INTRAMUSCULAR | Status: AC
  Administered 2015-05-01: 50 ug
  Filled 2015-05-01: qty 2

## 2015-05-01 MED ORDER — SUFENTANIL CITRATE 50 MCG/ML IV SOLN
INTRAVENOUS | Status: AC
  Filled 2015-05-01: qty 2

## 2015-05-01 MED ORDER — SODIUM CHLORIDE 0.9 % IJ SOLN: 3.0000 mL | INTRAMUSCULAR | Status: DC | PRN

## 2015-05-01 MED ORDER — ZOLPIDEM TARTRATE 5 MG PO TABS
5.0000 mg | ORAL_TABLET | Freq: Every evening | ORAL | Status: DC | PRN
  Administered 2015-05-01: 5 mg via ORAL
  Filled 2015-05-01: qty 1

## 2015-05-01 MED ORDER — SODIUM CHLORIDE 0.9 % IJ SOLN
3.0000 mL | Freq: Two times a day (BID) | INTRAMUSCULAR | Status: DC
  Administered 2015-05-01: 3 mL via INTRAVENOUS

## 2015-05-01 MED ORDER — ASPIRIN EC 81 MG PO TBEC: 81.0000 mg | DELAYED_RELEASE_TABLET | Freq: Every day | ORAL | Status: DC

## 2015-05-01 MED ORDER — PROPOFOL 10 MG/ML IV BOLUS
INTRAVENOUS | Status: AC
  Filled 2015-05-01: qty 20

## 2015-05-01 MED ORDER — VITAMIN B-12 100 MCG PO TABS: 100.0000 ug | ORAL_TABLET | Freq: Every day | ORAL | Status: DC

## 2015-05-01 MED ORDER — LACTATED RINGERS IV SOLN
INTRAVENOUS | Status: DC
  Administered 2015-05-01 – 2015-05-02 (×2): via INTRAVENOUS

## 2015-05-01 NOTE — Progress Notes (Signed)
Assisted pt to use bed pan, voided without difficulty. Pt eating graham crackers and drinking water, did not want a sandwich or tray from cafeteria.

## 2015-05-01 NOTE — Anesthesia Preprocedure Evaluation (Addendum)
Anesthesia Evaluation  Patient identified by MRN, date of birth, ID band Patient awake    Reviewed: Allergy & Precautions, NPO status   Airway Mallampati: II  TM Distance: <3 FB Neck ROM: Full    Dental  (+) Edentulous Upper, Poor Dentition, Missing   Pulmonary shortness of breath, COPD, Current Smoker,    breath sounds clear to auscultation       Cardiovascular hypertension, + CAD and + Peripheral Vascular Disease  + dysrhythmias  Rhythm:Regular Rate:Normal     Neuro/Psych  Neuromuscular disease    GI/Hepatic hiatal hernia, GERD  ,  Endo/Other    Renal/GU Renal disease     Musculoskeletal  (+) Arthritis , Fibromyalgia -  Abdominal   Peds  Hematology   Anesthesia Other Findings   Reproductive/Obstetrics                            Anesthesia Physical Anesthesia Plan  ASA: III  Anesthesia Plan: General   Post-op Pain Management:    Induction: Intravenous  Airway Management Planned: Oral ETT  Additional Equipment: PA Cath and Arterial line  Intra-op Plan:   Post-operative Plan: Extubation in OR  Informed Consent: I have reviewed the patients History and Physical, chart, labs and discussed the procedure including the risks, benefits and alternatives for the proposed anesthesia with the patient or authorized representative who has indicated his/her understanding and acceptance.     Plan Discussed with:   Anesthesia Plan Comments:         Anesthesia Quick Evaluation

## 2015-05-01 NOTE — Progress Notes (Signed)
Spoke with Elmyra Ricks in Bed Placement, no stepdown beds available, transfers pending. Went to inform pt whom remains in bed with eyes closed, respirations even and unlabored. Water and graham crackers at bedside.

## 2015-05-01 NOTE — Progress Notes (Signed)
Bed control  Called and bed requested states they will call us when bed is ready. Dr  Trula Slade requested a bed on 3300

## 2015-05-01 NOTE — Progress Notes (Signed)
Pt and family informed of surgery being canceled for today and her being admitted overnight. Understanding of situation although not particularly happy about it. IV and art line left in place, along with introducer for central line.

## 2015-05-01 NOTE — Progress Notes (Signed)
Pt has bed, 3 [place], bed 4. Report called to Cassell Smiles. Pt transferred via stretcher accompanied by this nurse and family with chart. Condition stable at time of transfer.

## 2015-05-02 ENCOUNTER — Encounter (HOSPITAL_COMMUNITY)
Admission: RE | Disposition: A | Discharge status: Skilled Nursing Facility | Payer: Self-pay | Source: Ambulatory Visit | Attending: Surgery

## 2015-05-02 ENCOUNTER — Inpatient Hospital Stay (HOSPITAL_COMMUNITY): Payer: Medicare Other

## 2015-05-02 ENCOUNTER — Encounter (HOSPITAL_COMMUNITY): Payer: Self-pay

## 2015-05-02 ENCOUNTER — Inpatient Hospital Stay (HOSPITAL_COMMUNITY): Payer: Medicare Other | Admitting: Anesthesiology

## 2015-05-02 ENCOUNTER — Encounter (HOSPITAL_COMMUNITY)
Admission: RE | Disposition: A | Discharge status: Skilled Nursing Facility | Payer: Medicare Other | Source: Ambulatory Visit | Attending: Surgery

## 2015-05-02 DIAGNOSIS — I714 Abdominal aortic aneurysm, without rupture, unspecified: Secondary | ICD-10-CM | POA: Diagnosis present

## 2015-05-02 HISTORY — PX: ABDOMINAL AORTIC ANEURYSM REPAIR: SHX42

## 2015-05-02 LAB — POCT I-STAT 7, (LYTES, BLD GAS, ICA,H+H)
ACID-BASE DEFICIT: 5 mmol/L — AB (ref 0.0–2.0)
Acid-base deficit: 2 mmol/L (ref 0.0–2.0)
Bicarbonate: 22.1 mEq/L (ref 20.0–24.0)
Bicarbonate: 20.4 mEq/L (ref 20.0–24.0)
CALCIUM ION: 1.02 mmol/L — AB (ref 1.13–1.30)
Calcium, Ion: 1.07 mmol/L — ABNORMAL LOW (ref 1.13–1.30)
HCT: 27 % — ABNORMAL LOW (ref 36.0–46.0)
HEMATOCRIT: 41 % (ref 36.0–46.0)
HEMOGLOBIN: 9.2 g/dL — AB (ref 12.0–15.0)
HEMOGLOBIN: 13.9 g/dL (ref 12.0–15.0)
O2 SAT: 99 %
O2 Saturation: 75 %
PCO2 ART: 34.7 mmHg — AB (ref 35.0–45.0)
PCO2 ART: 38.8 mmHg (ref 35.0–45.0)
POTASSIUM: 3.1 mmol/L — AB (ref 3.5–5.1)
POTASSIUM: 3.5 mmol/L (ref 3.5–5.1)
Patient temperature: 36.2
Patient temperature: 36.2
SODIUM: 142 mmol/L (ref 135–145)
Sodium: 140 mmol/L (ref 135–145)
TCO2: 23 mmol/L (ref 0–100)
TCO2: 22 mmol/L (ref 0–100)
pH, Arterial: 7.408 (ref 7.350–7.450)
pH, Arterial: 7.325 — ABNORMAL LOW (ref 7.350–7.450)
pO2, Arterial: 38 mmHg — CL (ref 80.0–100.0)
pO2, Arterial: 163 mmHg — ABNORMAL HIGH (ref 80.0–100.0)

## 2015-05-02 LAB — C DIFFICILE QUICK SCREEN W PCR REFLEX
C DIFFICILE (CDIFF) INTERP: NEGATIVE
C DIFFICILE (CDIFF) TOXIN: NEGATIVE
C DIFFICLE (CDIFF) ANTIGEN: NEGATIVE

## 2015-05-02 LAB — BASIC METABOLIC PANEL
ANION GAP: 10 (ref 5–15)
ANION GAP: 8 (ref 5–15)
BUN: 10 mg/dL (ref 6–20)
BUN: 11 mg/dL (ref 6–20)
CALCIUM: 7.8 mg/dL — AB (ref 8.9–10.3)
CALCIUM: 7.5 mg/dL — AB (ref 8.9–10.3)
CO2: 20 mmol/L — ABNORMAL LOW (ref 22–32)
CO2: 22 mmol/L (ref 22–32)
CREATININE: 1.23 mg/dL — AB (ref 0.44–1.00)
Chloride: 111 mmol/L (ref 101–111)
Chloride: 108 mmol/L (ref 101–111)
Creatinine, Ser: 1.39 mg/dL — ABNORMAL HIGH (ref 0.44–1.00)
GFR, EST AFRICAN AMERICAN: 47 mL/min — AB (ref >60)
GFR, EST AFRICAN AMERICAN: 41 mL/min — AB (ref >60)
GFR, EST NON AFRICAN AMERICAN: 41 mL/min — AB (ref >60)
GFR, EST NON AFRICAN AMERICAN: 35 mL/min — AB (ref >60)
Glucose, Bld: 134 mg/dL — ABNORMAL HIGH (ref 65–99)
Glucose, Bld: 233 mg/dL — ABNORMAL HIGH (ref 65–99)
Potassium: 3.6 mmol/L (ref 3.5–5.1)
Potassium: 3.6 mmol/L (ref 3.5–5.1)
SODIUM: 141 mmol/L (ref 135–145)
SODIUM: 138 mmol/L (ref 135–145)

## 2015-05-02 LAB — BLOOD GAS, ARTERIAL
Acid-base deficit: 6.1 mmol/L — ABNORMAL HIGH (ref 0.0–2.0)
BICARBONATE: 19.1 meq/L — AB (ref 20.0–24.0)
FIO2: 0.28
O2 Saturation: 93.6 %
PH ART: 7.308 — AB (ref 7.350–7.450)
Patient temperature: 98.6
TCO2: 20.3 mmol/L (ref 0–100)
pCO2 arterial: 39.2 mmHg (ref 35.0–45.0)
pO2, Arterial: 75.1 mmHg — ABNORMAL LOW (ref 80.0–100.0)

## 2015-05-02 LAB — CBC
HCT: 43.6 % (ref 36.0–46.0)
HEMATOCRIT: 44.4 % (ref 36.0–46.0)
Hemoglobin: 15.4 g/dL — ABNORMAL HIGH (ref 12.0–15.0)
Hemoglobin: 14.3 g/dL (ref 12.0–15.0)
MCH: 30.4 pg (ref 26.0–34.0)
MCH: 29.2 pg (ref 26.0–34.0)
MCHC: 34.7 g/dL (ref 30.0–36.0)
MCHC: 32.8 g/dL (ref 30.0–36.0)
MCV: 87.7 fL (ref 78.0–100.0)
MCV: 89 fL (ref 78.0–100.0)
PLATELETS: 117 10*3/uL — AB (ref 150–400)
PLATELETS: 102 10*3/uL — AB (ref 150–400)
RBC: 5.06 MIL/uL (ref 3.87–5.11)
RBC: 4.9 MIL/uL (ref 3.87–5.11)
RDW: 15.2 % (ref 11.5–15.5)
RDW: 15.4 % (ref 11.5–15.5)
WBC: 9 10*3/uL (ref 4.0–10.5)
WBC: 11.7 10*3/uL — AB (ref 4.0–10.5)

## 2015-05-02 LAB — APTT: aPTT: 45 seconds — ABNORMAL HIGH (ref 24–37)

## 2015-05-02 LAB — PROTIME-INR
INR: 1.54 — AB (ref 0.00–1.49)
Prothrombin Time: 18.5 seconds — ABNORMAL HIGH (ref 11.6–15.2)

## 2015-05-02 LAB — MAGNESIUM: MAGNESIUM: 1.6 mg/dL — AB (ref 1.7–2.4)

## 2015-05-02 LAB — PREPARE RBC (CROSSMATCH)

## 2015-05-02 SURGERY — ANEURYSM ABDOMINAL AORTIC REPAIR
Anesthesia: General | Site: Abdomen

## 2015-05-02 SURGERY — ANEURYSM ABDOMINAL AORTIC REPAIR
Anesthesia: General

## 2015-05-02 MED ORDER — HEMOSTATIC AGENTS (NO CHARGE) OPTIME
TOPICAL | Status: DC | PRN
  Administered 2015-05-02 (×3): 1 via TOPICAL

## 2015-05-02 MED ORDER — FENTANYL CITRATE (PF) 100 MCG/2ML IJ SOLN
INTRAMUSCULAR | Status: AC
  Filled 2015-05-02: qty 2

## 2015-05-02 MED ORDER — ALBUMIN HUMAN 5 % IV SOLN
INTRAVENOUS | Status: DC | PRN
  Administered 2015-05-02 (×2): via INTRAVENOUS

## 2015-05-02 MED ORDER — PROTAMINE SULFATE 10 MG/ML IV SOLN
INTRAVENOUS | Status: AC
  Filled 2015-05-02: qty 5

## 2015-05-02 MED ORDER — SUCCINYLCHOLINE CHLORIDE 20 MG/ML IJ SOLN
INTRAMUSCULAR | Status: AC
  Filled 2015-05-02: qty 1

## 2015-05-02 MED ORDER — BISACODYL 10 MG RE SUPP: 10.0000 mg | Freq: Every day | RECTAL | Status: DC | PRN

## 2015-05-02 MED ORDER — PHENOL 1.4 % MT LIQD: 1.0000 | OROMUCOSAL | Status: DC | PRN

## 2015-05-02 MED ORDER — LIDOCAINE HCL (CARDIAC) 20 MG/ML IV SOLN
INTRAVENOUS | Status: DC | PRN
Start: 2015-05-02 — End: 2015-05-02
  Administered 2015-05-02: 50 mg via INTRAVENOUS

## 2015-05-02 MED ORDER — ALUM & MAG HYDROXIDE-SIMETH 200-200-20 MG/5ML PO SUSP: 15.0000 mL | ORAL | Status: DC | PRN

## 2015-05-02 MED ORDER — METOPROLOL TARTRATE 1 MG/ML IV SOLN: 2.0000 mg | INTRAVENOUS | Status: DC | PRN

## 2015-05-02 MED ORDER — PHENYLEPHRINE HCL 10 MG/ML IJ SOLN
10.0000 mg | INTRAVENOUS | Status: DC | PRN
  Administered 2015-05-02: 15 ug/min via INTRAVENOUS

## 2015-05-02 MED ORDER — EPHEDRINE SULFATE 50 MG/ML IJ SOLN
INTRAMUSCULAR | Status: DC | PRN
  Administered 2015-05-02: 5 mg via INTRAVENOUS
  Administered 2015-05-02 (×2): 2.5 mg via INTRAVENOUS
  Administered 2015-05-02: 5 mg via INTRAVENOUS

## 2015-05-02 MED ORDER — PANTOPRAZOLE SODIUM 40 MG PO TBEC
40.0000 mg | DELAYED_RELEASE_TABLET | Freq: Every day | ORAL | Status: DC
  Administered 2015-05-05 – 2015-05-08 (×4): 40 mg via ORAL
  Filled 2015-05-02 (×5): qty 1

## 2015-05-02 MED ORDER — SENNA 8.6 MG PO TABS
1.0000 | ORAL_TABLET | Freq: Two times a day (BID) | ORAL | Status: DC
  Filled 2015-05-02 (×5): qty 1

## 2015-05-02 MED ORDER — MIDAZOLAM HCL 2 MG/2ML IJ SOLN
INTRAMUSCULAR | Status: AC
  Filled 2015-05-02: qty 2

## 2015-05-02 MED ORDER — ROCURONIUM BROMIDE 50 MG/5ML IV SOLN
INTRAVENOUS | Status: AC
  Filled 2015-05-02: qty 1

## 2015-05-02 MED ORDER — ACETAMINOPHEN 325 MG RE SUPP: 325.0000 mg | RECTAL | Status: DC | PRN

## 2015-05-02 MED ORDER — SODIUM CHLORIDE 0.9 % IV SOLN
INTRAVENOUS | Status: DC | PRN
  Administered 2015-05-02: 500 mL

## 2015-05-02 MED ORDER — LIDOCAINE HCL (CARDIAC) 20 MG/ML IV SOLN
INTRAVENOUS | Status: AC
  Filled 2015-05-02: qty 5

## 2015-05-02 MED ORDER — KCL IN DEXTROSE-NACL 20-5-0.45 MEQ/L-%-% IV SOLN
INTRAVENOUS | Status: AC
  Filled 2015-05-02: qty 1000

## 2015-05-02 MED ORDER — FENTANYL CITRATE (PF) 100 MCG/2ML IJ SOLN
25.0000 ug | INTRAMUSCULAR | Status: DC | PRN
  Administered 2015-05-02: 50 ug via INTRAVENOUS
  Administered 2015-05-02: 25 ug via INTRAVENOUS
  Administered 2015-05-02: 50 ug via INTRAVENOUS
  Administered 2015-05-02: 25 ug via INTRAVENOUS

## 2015-05-02 MED ORDER — MORPHINE SULFATE (PF) 2 MG/ML IV SOLN
2.0000 mg | INTRAVENOUS | Status: DC | PRN
  Administered 2015-05-02 – 2015-05-04 (×5): 4 mg via INTRAVENOUS
  Administered 2015-05-04 (×2): 5 mg via INTRAVENOUS
  Administered 2015-05-05 – 2015-05-06 (×2): 2 mg via INTRAVENOUS
  Filled 2015-05-02: qty 2
  Filled 2015-05-02: qty 3
  Filled 2015-05-02: qty 1
  Filled 2015-05-02: qty 3
  Filled 2015-05-02 (×4): qty 2
  Filled 2015-05-02: qty 1

## 2015-05-02 MED ORDER — MAGNESIUM SULFATE 2 GM/50ML IV SOLN
2.0000 g | Freq: Every day | INTRAVENOUS | Status: DC | PRN
  Filled 2015-05-02: qty 50

## 2015-05-02 MED ORDER — ONDANSETRON HCL 4 MG/2ML IJ SOLN
4.0000 mg | Freq: Four times a day (QID) | INTRAMUSCULAR | Status: DC | PRN
  Administered 2015-05-02: 4 mg via INTRAVENOUS
  Filled 2015-05-02: qty 2

## 2015-05-02 MED ORDER — LACTATED RINGERS IV SOLN
INTRAVENOUS | Status: DC | PRN
  Administered 2015-05-02 (×3): via INTRAVENOUS

## 2015-05-02 MED ORDER — EPHEDRINE SULFATE 50 MG/ML IJ SOLN
INTRAMUSCULAR | Status: AC
  Filled 2015-05-02: qty 1

## 2015-05-02 MED ORDER — ACETAMINOPHEN 325 MG PO TABS: 325.0000 mg | ORAL_TABLET | ORAL | Status: DC | PRN

## 2015-05-02 MED ORDER — MIDAZOLAM HCL 5 MG/5ML IJ SOLN
INTRAMUSCULAR | Status: DC | PRN
  Administered 2015-05-02 (×2): 1 mg via INTRAVENOUS

## 2015-05-02 MED ORDER — PROTAMINE SULFATE 10 MG/ML IV SOLN
INTRAVENOUS | Status: DC | PRN
  Administered 2015-05-02 (×5): 10 mg via INTRAVENOUS

## 2015-05-02 MED ORDER — OXYCODONE-ACETAMINOPHEN 5-325 MG PO TABS
1.0000 | ORAL_TABLET | ORAL | Status: DC | PRN
  Administered 2015-05-06: 2 via ORAL
  Filled 2015-05-02: qty 2

## 2015-05-02 MED ORDER — GUAIFENESIN-DM 100-10 MG/5ML PO SYRP
15.0000 mL | ORAL_SOLUTION | ORAL | Status: DC | PRN
Start: 2015-05-02 — End: 2015-05-02

## 2015-05-02 MED ORDER — ACETAMINOPHEN 325 MG RE SUPP
325.0000 mg | RECTAL | Status: DC | PRN
  Filled 2015-05-02: qty 2

## 2015-05-02 MED ORDER — SODIUM CHLORIDE 0.9 % IJ SOLN
INTRAMUSCULAR | Status: AC
  Filled 2015-05-02: qty 10

## 2015-05-02 MED ORDER — PROMETHAZINE HCL 25 MG/ML IJ SOLN: 6.2500 mg | INTRAMUSCULAR | Status: DC | PRN

## 2015-05-02 MED ORDER — VANCOMYCIN HCL 1000 MG IV SOLR
1000.0000 mg | INTRAVENOUS | Status: DC | PRN
  Administered 2015-05-02: 1000 mg via INTRAVENOUS

## 2015-05-02 MED ORDER — OXYCODONE HCL 5 MG PO TABS: 5.0000 mg | ORAL_TABLET | Freq: Four times a day (QID) | ORAL | Status: DC | PRN

## 2015-05-02 MED ORDER — MORPHINE SULFATE (PF) 2 MG/ML IV SOLN: 2.0000 mg | INTRAVENOUS | Status: DC | PRN

## 2015-05-02 MED ORDER — PROPOFOL 10 MG/ML IV BOLUS
INTRAVENOUS | Status: DC | PRN
  Administered 2015-05-02: 150 mg via INTRAVENOUS
  Administered 2015-05-02: 20 mg via INTRAVENOUS

## 2015-05-02 MED ORDER — FENTANYL CITRATE (PF) 250 MCG/5ML IJ SOLN
INTRAMUSCULAR | Status: AC
  Filled 2015-05-02: qty 5

## 2015-05-02 MED ORDER — HEPARIN SODIUM (PORCINE) 1000 UNIT/ML IJ SOLN
INTRAMUSCULAR | Status: DC | PRN
  Administered 2015-05-02: 8000 [IU] via INTRAVENOUS
  Administered 2015-05-02: 1000 [IU] via INTRAVENOUS
  Administered 2015-05-02: 3000 [IU] via INTRAVENOUS

## 2015-05-02 MED ORDER — VANCOMYCIN HCL IN DEXTROSE 1-5 GM/200ML-% IV SOLN: 1000.0000 mg | Freq: Two times a day (BID) | INTRAVENOUS | Status: DC

## 2015-05-02 MED ORDER — SUGAMMADEX SODIUM 500 MG/5ML IV SOLN
INTRAVENOUS | Status: DC | PRN
  Administered 2015-05-02: 150 mg via INTRAVENOUS
  Administered 2015-05-02: 50 mg via INTRAVENOUS

## 2015-05-02 MED ORDER — SUFENTANIL CITRATE 50 MCG/ML IV SOLN
INTRAVENOUS | Status: AC
  Filled 2015-05-02: qty 1

## 2015-05-02 MED ORDER — ENOXAPARIN SODIUM 40 MG/0.4ML ~~LOC~~ SOLN
40.0000 mg | SUBCUTANEOUS | Status: DC
  Administered 2015-05-03: 40 mg via SUBCUTANEOUS
  Filled 2015-05-02: qty 0.4

## 2015-05-02 MED ORDER — PANTOPRAZOLE SODIUM 40 MG PO TBEC: 40.0000 mg | DELAYED_RELEASE_TABLET | Freq: Every day | ORAL | Status: DC

## 2015-05-02 MED ORDER — SENNOSIDES-DOCUSATE SODIUM 8.6-50 MG PO TABS: 1.0000 | ORAL_TABLET | Freq: Every evening | ORAL | Status: DC | PRN

## 2015-05-02 MED ORDER — LABETALOL HCL 5 MG/ML IV SOLN
10.0000 mg | INTRAVENOUS | Status: DC | PRN
  Administered 2015-05-05 – 2015-05-08 (×2): 10 mg via INTRAVENOUS
  Filled 2015-05-02 (×2): qty 4

## 2015-05-02 MED ORDER — VANCOMYCIN HCL IN DEXTROSE 1-5 GM/200ML-% IV SOLN: 1000.0000 mg | INTRAVENOUS | Status: DC

## 2015-05-02 MED ORDER — SODIUM CHLORIDE 0.9 % IV SOLN
INTRAVENOUS | Status: DC | PRN
  Administered 2015-05-02: 10:00:00 via INTRAVENOUS

## 2015-05-02 MED ORDER — ROCURONIUM BROMIDE 100 MG/10ML IV SOLN
INTRAVENOUS | Status: DC | PRN
  Administered 2015-05-02 (×2): 20 mg via INTRAVENOUS
  Administered 2015-05-02: 10 mg via INTRAVENOUS
  Administered 2015-05-02: 20 mg via INTRAVENOUS
  Administered 2015-05-02: 50 mg via INTRAVENOUS
  Administered 2015-05-02: 10 mg via INTRAVENOUS

## 2015-05-02 MED ORDER — MAGNESIUM HYDROXIDE 400 MG/5ML PO SUSP: 30.0000 mL | Freq: Every day | ORAL | Status: DC | PRN

## 2015-05-02 MED ORDER — ONDANSETRON HCL 4 MG/2ML IJ SOLN
INTRAMUSCULAR | Status: AC
  Filled 2015-05-02: qty 2

## 2015-05-02 MED ORDER — POTASSIUM CHLORIDE CRYS ER 20 MEQ PO TBCR
20.0000 meq | EXTENDED_RELEASE_TABLET | Freq: Every day | ORAL | Status: DC | PRN
  Filled 2015-05-02: qty 2

## 2015-05-02 MED ORDER — SUGAMMADEX SODIUM 500 MG/5ML IV SOLN
INTRAVENOUS | Status: AC
  Filled 2015-05-02: qty 5

## 2015-05-02 MED ORDER — HYDRALAZINE HCL 20 MG/ML IJ SOLN: 5.0000 mg | INTRAMUSCULAR | Status: DC | PRN

## 2015-05-02 MED ORDER — LACTATED RINGERS IV SOLN
INTRAVENOUS | Status: DC | PRN
  Administered 2015-05-02: 07:00:00 via INTRAVENOUS

## 2015-05-02 MED ORDER — ONDANSETRON HCL 4 MG/2ML IJ SOLN
INTRAMUSCULAR | Status: DC | PRN
  Administered 2015-05-02: 4 mg via INTRAVENOUS

## 2015-05-02 MED ORDER — DOCUSATE SODIUM 100 MG PO CAPS
100.0000 mg | ORAL_CAPSULE | Freq: Every day | ORAL | Status: DC
  Filled 2015-05-02 (×3): qty 1

## 2015-05-02 MED ORDER — SUFENTANIL CITRATE 50 MCG/ML IV SOLN
INTRAVENOUS | Status: DC | PRN
  Administered 2015-05-02: 10 ug via INTRAVENOUS
  Administered 2015-05-02 (×2): 5 ug via INTRAVENOUS
  Administered 2015-05-02 (×3): 10 ug via INTRAVENOUS
  Administered 2015-05-02: 30 ug via INTRAVENOUS
  Administered 2015-05-02: 15 ug via INTRAVENOUS
  Administered 2015-05-02: 5 ug via INTRAVENOUS

## 2015-05-02 MED ORDER — ONDANSETRON HCL 4 MG/2ML IJ SOLN: 4.0000 mg | Freq: Four times a day (QID) | INTRAMUSCULAR | Status: DC | PRN

## 2015-05-02 MED ORDER — GUAIFENESIN-DM 100-10 MG/5ML PO SYRP: 15.0000 mL | ORAL_SOLUTION | ORAL | Status: DC | PRN

## 2015-05-02 MED ORDER — SODIUM CHLORIDE 0.9 % IV SOLN: 500.0000 mL | Freq: Once | INTRAVENOUS | Status: DC | PRN

## 2015-05-02 MED ORDER — KCL IN DEXTROSE-NACL 20-5-0.45 MEQ/L-%-% IV SOLN
INTRAVENOUS | Status: DC
  Administered 2015-05-02: 125 mL/h via INTRAVENOUS
  Administered 2015-05-02 – 2015-05-03 (×2): via INTRAVENOUS
  Administered 2015-05-03: 125 mL/h via INTRAVENOUS
  Administered 2015-05-03 – 2015-05-07 (×8): via INTRAVENOUS
  Filled 2015-05-02 (×18): qty 1000

## 2015-05-02 MED ORDER — HYDROMORPHONE HCL 1 MG/ML IJ SOLN: 0.2500 mg | INTRAMUSCULAR | Status: DC | PRN

## 2015-05-02 MED ORDER — 0.9 % SODIUM CHLORIDE (POUR BTL) OPTIME
TOPICAL | Status: DC | PRN
  Administered 2015-05-02: 1000 mL
  Administered 2015-05-02: 2000 mL

## 2015-05-02 MED ORDER — POTASSIUM CHLORIDE CRYS ER 20 MEQ PO TBCR: 20.0000 meq | EXTENDED_RELEASE_TABLET | Freq: Once | ORAL | Status: DC

## 2015-05-02 MED ORDER — PROPOFOL 10 MG/ML IV BOLUS
INTRAVENOUS | Status: AC
  Filled 2015-05-02: qty 20

## 2015-05-02 MED ORDER — ENOXAPARIN SODIUM 40 MG/0.4ML ~~LOC~~ SOLN: 40.0000 mg | SUBCUTANEOUS | Status: DC

## 2015-05-02 MED ORDER — VANCOMYCIN HCL IN DEXTROSE 1-5 GM/200ML-% IV SOLN
1000.0000 mg | INTRAVENOUS | Status: AC
  Administered 2015-05-03: 1000 mg via INTRAVENOUS
  Filled 2015-05-02: qty 200

## 2015-05-02 SURGICAL SUPPLY — 56 items
CANISTER SUCTION 2500CC (MISCELLANEOUS) ×3 IMPLANT
CATH EMB 3FR 40CM (CATHETERS) ×4 IMPLANT
CLIP TI MEDIUM 24 (CLIP) ×3 IMPLANT
CLIP TI WIDE RED SMALL 24 (CLIP) ×3 IMPLANT
COVER MAYO STAND STRL (DRAPES) ×3 IMPLANT
ELECT BLADE 4.0 EZ CLEAN MEGAD (MISCELLANEOUS) ×6
ELECT BLADE 6.5 EXT (BLADE) ×2 IMPLANT
ELECT REM PT RETURN 9FT ADLT (ELECTROSURGICAL) ×3
ELECTRODE BLDE 4.0 EZ CLN MEGD (MISCELLANEOUS) ×1 IMPLANT
ELECTRODE REM PT RTRN 9FT ADLT (ELECTROSURGICAL) ×1 IMPLANT
FELT TEFLON 4X4 (MISCELLANEOUS) ×2 IMPLANT
GEL ULTRASOUND 20GR AQUASONIC (MISCELLANEOUS) IMPLANT
GLOVE BIOGEL PI IND STRL 6.5 (GLOVE) IMPLANT
GLOVE BIOGEL PI IND STRL 7.5 (GLOVE) ×1 IMPLANT
GLOVE BIOGEL PI INDICATOR 6.5 (GLOVE) ×10
GLOVE BIOGEL PI INDICATOR 7.5 (GLOVE) ×4
GLOVE ECLIPSE 6.5 STRL STRAW (GLOVE) ×4 IMPLANT
GLOVE ECLIPSE 7.0 STRL STRAW (GLOVE) ×2 IMPLANT
GLOVE SS BIOGEL STRL SZ 6.5 (GLOVE) IMPLANT
GLOVE SUPERSENSE BIOGEL SZ 6.5 (GLOVE) ×2
GLOVE SURG SS PI 7.5 STRL IVOR (GLOVE) ×3 IMPLANT
GOWN STRL REUS W/ TWL LRG LVL3 (GOWN DISPOSABLE) ×2 IMPLANT
GOWN STRL REUS W/ TWL XL LVL3 (GOWN DISPOSABLE) ×1 IMPLANT
GOWN STRL REUS W/TWL LRG LVL3 (GOWN DISPOSABLE) ×15
GOWN STRL REUS W/TWL XL LVL3 (GOWN DISPOSABLE) ×3
GRAFT HEMASHIELD 18X9MM (Vascular Products) ×2 IMPLANT
HEMOSTAT SNOW SURGICEL 2X4 (HEMOSTASIS) ×6 IMPLANT
INSERT FOGARTY 61MM (MISCELLANEOUS) ×6 IMPLANT
INSERT FOGARTY SM (MISCELLANEOUS) ×12 IMPLANT
KIT BASIN OR (CUSTOM PROCEDURE TRAY) ×3 IMPLANT
KIT ROOM TURNOVER OR (KITS) ×3 IMPLANT
LIQUID BAND (GAUZE/BANDAGES/DRESSINGS) ×4 IMPLANT
NS IRRIG 1000ML POUR BTL (IV SOLUTION) ×8 IMPLANT
PACK AORTA (CUSTOM PROCEDURE TRAY) ×3 IMPLANT
PAD ARMBOARD 7.5X6 YLW CONV (MISCELLANEOUS) ×6 IMPLANT
SPONGE INTESTINAL PEANUT (DISPOSABLE) ×2 IMPLANT
SPONGE LAP 18X18 X RAY DECT (DISPOSABLE) ×2 IMPLANT
STOPCOCK 4 WAY LG BORE MALE ST (IV SETS) ×2 IMPLANT
SUT ETHIBOND 5 LR DA (SUTURE) IMPLANT
SUT PDS AB 1 TP1 54 (SUTURE) ×6 IMPLANT
SUT PROLENE 3 0 SH 48 (SUTURE) ×11 IMPLANT
SUT PROLENE 3 0 SH DA (SUTURE) ×6 IMPLANT
SUT PROLENE 5 0 C 1 24 (SUTURE) ×2 IMPLANT
SUT PROLENE 5 0 C 1 36 (SUTURE) ×16 IMPLANT
SUT PROLENE 6 0 C 1 30 (SUTURE) ×2 IMPLANT
SUT PROLENE 6 0 CC (SUTURE) ×2 IMPLANT
SUT SILK 2 0SH CR/8 30 (SUTURE) ×3 IMPLANT
SUT VIC AB 2-0 CT1 27 (SUTURE) ×12
SUT VIC AB 2-0 CT1 TAPERPNT 27 (SUTURE) ×2 IMPLANT
SUT VIC AB 3-0 SH 27 (SUTURE) ×6
SUT VIC AB 3-0 SH 27X BRD (SUTURE) ×4 IMPLANT
SUT VICRYL 4-0 PS2 18IN ABS (SUTURE) ×6 IMPLANT
SYRINGE 3CC LL L/F (MISCELLANEOUS) ×2 IMPLANT
TOWEL BLUE STERILE X RAY DET (MISCELLANEOUS) ×6 IMPLANT
TRAY FOLEY W/METER SILVER 16FR (SET/KITS/TRAYS/PACK) ×3 IMPLANT
WATER STERILE IRR 1000ML POUR (IV SOLUTION) ×6 IMPLANT

## 2015-05-02 NOTE — Progress Notes (Signed)
Post- op AAA Extubated in OR Appears comfortable Slight mottling to both LE Doppler left PT, right DP/PT Moves all extremities  Transfer to ICU when appropriate Post op labs OK Family updated   Margaret Foley

## 2015-05-02 NOTE — Brief Op Note (Signed)
05/01/2015 - 05/02/2015  8:16 PM  PATIENT:  Margaret Foley  80 y.o. female  PRE-OPERATIVE DIAGNOSIS:  abdominal aortic aneurysm  POST-OPERATIVE DIAGNOSIS:  abdominal aortic aneurysm  PROCEDURE:  Procedure(s): REPAIR ABDOMINAL AORTIC ANEURYSM  ,AORTA BI-ILIAC (N/A)  SURGEON:  Surgeon(s) and Role:    Serafina Mitchell, MD - Primary  PHYSICIAN ASSISTANT:   ASSISTANTS: Lennie Muckle   ANESTHESIA:   general  EBL:     BLOOD ADMINISTERED:  See anesthesia record  DRAINS: none   LOCAL MEDICATIONS USED:  NONE  SPECIMEN:  No Specimen  DISPOSITION OF SPECIMEN:  N/A  COUNTS:  YES  TOURNIQUET:  * No tourniquets in log *  DICTATION: .Dragon Dictation  PLAN OF CARE: Admit to inpatient   PATIENT DISPOSITION:  PACU - hemodynamically stable.   Delay start of Pharmacological VTE agent (>24hrs) due to surgical blood loss or risk of bleeding: yes

## 2015-05-02 NOTE — Interval H&P Note (Signed)
History and Physical Interval Note:  05/02/2015 7:06 AM  Margaret Foley  has presented today for surgery, with the diagnosis of aneurysm  The various methods of treatment have been discussed with the patient and family. After consideration of risks, benefits and other options for treatment, the patient has consented to  Procedure(s): ANEURYSM ABDOMINAL AORTIC REPAIR (N/A) as a surgical intervention .  The patient's history has been reviewed, patient examined, no change in status, stable for surgery.  I have reviewed the patient's chart and labs.  Questions were answered to the patient's satisfaction.     Annamarie Major  Due to unforseen circumstances, the case was cancelled yesterday.  When this had happened, the patient had already had her lines placed.  I felt it was appropriate to admit her overnight with plans for AAA repair this am.    WElls Stefanny Pieri

## 2015-05-02 NOTE — Anesthesia Procedure Notes (Signed)
Procedure Name: Intubation Date/Time: 05/02/2015 7:31 AM Performed by: Suzy Bouchard Pre-anesthesia Checklist: Patient identified, Emergency Drugs available, Suction available, Patient being monitored and Timeout performed Patient Re-evaluated:Patient Re-evaluated prior to inductionOxygen Delivery Method: Circle system utilized Preoxygenation: Pre-oxygenation with 100% oxygen Intubation Type: IV induction Ventilation: Mask ventilation without difficulty Laryngoscope Size: Mac and 3 Grade View: Grade I Tube size: 7.0 mm Number of attempts: 1 Airway Equipment and Method: Stylet Placement Confirmation: ETT inserted through vocal cords under direct vision,  positive ETCO2 and breath sounds checked- equal and bilateral Secured at: 23 cm Tube secured with: Tape Dental Injury: Teeth and Oropharynx as per pre-operative assessment

## 2015-05-02 NOTE — Anesthesia Postprocedure Evaluation (Signed)
Anesthesia Post Note  Patient: ZAHRYA KAAIHUE  Procedure(s) Performed: Procedure(s) (LRB): REPAIR ABDOMINAL AORTIC ANEURYSM  ,AORTA BI-ILIAC (N/A)  Patient location during evaluation: PACU Anesthesia Type: General Level of consciousness: awake Pain management: pain level controlled Vital Signs Assessment: post-procedure vital signs reviewed and stable Respiratory status: spontaneous breathing Cardiovascular status: stable Anesthetic complications: no    Last Vitals:  Filed Vitals:   05/02/15 0340 05/02/15 0540  BP:    Pulse: 69 58  Temp:    Resp: 16 14    Last Pain:  Filed Vitals:   05/02/15 1431  PainSc: 8                  EDWARDS,Evelia Waskey

## 2015-05-02 NOTE — Progress Notes (Signed)
Pt toileted and report given to OR RN. VSS.

## 2015-05-02 NOTE — Anesthesia Postprocedure Evaluation (Signed)
Anesthesia Post Note  Patient: Margaret Foley  Procedure(s) Performed: Procedure(s) (LRB): REPAIR ABDOMINAL AORTIC ANEURYSM  ,AORTA BI-ILIAC (N/A)  Patient location during evaluation: PACU Anesthesia Type: General Level of consciousness: awake Pain management: pain level controlled Vital Signs Assessment: post-procedure vital signs reviewed and stable Respiratory status: spontaneous breathing Cardiovascular status: stable Anesthetic complications: no    Last Vitals:  Filed Vitals:   05/02/15 1730 05/02/15 1800  BP: 105/90 146/82  Pulse: 78 73  Temp: 36.4 C   Resp: 19 16    Last Pain:  Filed Vitals:   05/02/15 1806  PainSc: 8                  EDWARDS,Kaybree Williams

## 2015-05-02 NOTE — Transfer of Care (Signed)
Immediate Anesthesia Transfer of Care Note  Patient: Margaret Foley  Procedure(s) Performed: Procedure(s): REPAIR ABDOMINAL AORTIC ANEURYSM  ,AORTA BI-ILIAC (N/A)  Patient Location: PACU  Anesthesia Type:General  Level of Consciousness: awake and alert   Airway & Oxygen Therapy: Patient Spontanous Breathing  Post-op Assessment: Report given to RN and Post -op Vital signs reviewed and stable  Post vital signs: Reviewed and stable  Last Vitals:  Filed Vitals:   05/02/15 0340 05/02/15 0540  BP:    Pulse: 69 58  Temp:    Resp: 16 14    Complications: No apparent anesthesia complications

## 2015-05-02 NOTE — Anesthesia Preprocedure Evaluation (Addendum)
Anesthesia Evaluation  Patient identified by MRN, date of birth, ID band Patient awake    Reviewed: Allergy & Precautions, NPO status , Patient's Chart, lab work & pertinent test results  Airway Mallampati: II  TM Distance: >3 FB Neck ROM: Full    Dental  (+) Dental Advisory Given, Missing, Teeth Intact, Upper Dentures, Partial Lower   Pulmonary shortness of breath, COPD, Current Smoker,    breath sounds clear to auscultation       Cardiovascular hypertension, Pt. on medications + CAD and + Peripheral Vascular Disease  + dysrhythmias Atrial Fibrillation  Rhythm:Regular Rate:Normal     Neuro/Psych Anxiety    GI/Hepatic GERD  ,  Endo/Other    Renal/GU      Musculoskeletal  (+) Arthritis , Fibromyalgia -  Abdominal   Peds  Hematology   Anesthesia Other Findings   Reproductive/Obstetrics                         Anesthesia Physical Anesthesia Plan  ASA: III  Anesthesia Plan: General   Post-op Pain Management:    Induction: Intravenous  Airway Management Planned: Oral ETT  Additional Equipment: Arterial line  Intra-op Plan:   Post-operative Plan: Extubation in OR  Informed Consent: I have reviewed the patients History and Physical, chart, labs and discussed the procedure including the risks, benefits and alternatives for the proposed anesthesia with the patient or authorized representative who has indicated his/her understanding and acceptance.   Dental advisory given  Plan Discussed with:   Anesthesia Plan Comments:         Anesthesia Quick Evaluation

## 2015-05-02 NOTE — H&P (View-Only) (Signed)
Patient name: Margaret Foley MRN: [id number] DOB: 09/10/1934 Sex: female     Chief Complaint  Patient presents with  . Re-evaluation    discuss surgery     HISTORY OF PRESENT ILLNESS: The patient comes in today for further discussions regarding her abdominal aortic aneurysm.  Past Medical History  Diagnosis Date  . Hypertension   . GERD (gastroesophageal reflux disease)   . Hyperlipidemia   . Heart murmur     av sclerosis and mild ar echo 2008  . Chest pain     atypical normal myovue 2008  . Hip pain   . Cough   . Myalgia and myositis, unspecified     statin intolerant  . Peripheral vascular disease ([place])   . 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 Tobacco use disorder   . Unspecified vitamin D deficiency   . Other B-complex deficiencies   . Barrett's esophagus   . Osteopenia   . Osteoarthritis   . Lumbago   . CAD (coronary artery disease)   . Insomnia, unspecified   . COPD (chronic obstructive pulmonary disease) ([place])   . Carotid artery occlusion   . Cancer [facility])     Pre cancerous Uterine Tumor  . Atrial fibrillation [facility])     Past Surgical History  Procedure Laterality Date  . Cataract surgery  2011  . Abdominal hysterectomy  1986    Complete  . Joint replacement  2012    Right Shoulder and arm    Social History   Social History  . Marital Status: Single    Spouse Name: N/A  . Number of Children: N/A  . Years of Education: N/A   Occupational History  . retired    Social History Main Topics  . Smoking status: Current Every Day Smoker -- 0.50 packs/day for 57 years    Types: Cigarettes  . Smokeless tobacco: Never Used     Comment: pt states she is "on again off again" not a constant smoker   . Alcohol Use: No  . Drug Use: No  . Sexual Activity: Not on file   Other Topics Concern  . Not on file   Social History Narrative   Regular exercise--yes daily stretching.    Family History    Problem Relation Age of Onset  . Arthritis Mother   . Heart disease Mother   . Hypertension Mother   . Heart attack Mother   . Hyperlipidemia    . Heart disease Son   . Hypertension Son   . Cancer Father   . Hyperlipidemia Sister   . Cancer Brother     Allergies as of 04/28/2015 - Review Complete 04/28/2015  Allergen Reaction Noted  . Benazepril hcl  07/16/2008  . Carvedilol  02/03/2009  . Indomethacin    . Penicillins    . Pravastatin sodium  07/16/2008  . Rosuvastatin  08/19/2009  . Simvastatin  12/13/2007  . Advil [ibuprofen] Rash 04/08/2014    Current Outpatient Prescriptions on File Prior to Visit  Medication Sig Dispense Refill  . Ascorbic Acid (VITAMIN C PO) Take 1,200 mg by mouth daily.    [name] Kitchen aspirin 81 MG EC tablet Take 81 mg by mouth daily.      . calcium-vitamin D (OSCAL WITH D) 500-200 MG-UNIT per tablet Take 1 tablet by mouth daily.      . Cholecalciferol (VITAMIN D3) 1000 UNITS tablet Take 1,000 Units by mouth daily.      [name] Kitchen  cyanocobalamin 1000 MCG tablet Take 500 mcg by mouth daily.      . Multiple Vitamins-Minerals (MULTIVITAMIN WITH MINERALS) tablet Take 1 tablet by mouth daily.    . Omega-3 Fatty Acids (OMEGA 3 PO) Take 1 tablet by mouth daily.       No current facility-administered medications on file prior to visit.     REVIEW OF SYSTEMS: No changes from prior visit  PHYSICAL EXAMINATION:   Vital signs are  Filed Vitals:   04/28/15 0840 04/28/15 0842  BP: 156/83 157/80  Pulse: 62   Height: 5\' 3"  (1.6 m)   Weight: 170 lb 1.6 oz (77.157 kg)   SpO2: 96%    Body mass index is 30.14 kg/(m^2). General: The patient appears their stated age. HEENT:  No gross abnormalities Pulmonary:  Non labored breathing Musculoskeletal: There are no major deformities. Neurologic: No focal weakness or paresthesias are detected, Skin: There are no ulcer or rashes noted. Psychiatric: The patient has normal affect.    Diagnostic  Studies None  Assessment: Abdominal aortic aneurysm Plan: The patient is accompanied with her son today.  I brought the bed because after additional review of her CT scan, I think that she is a better candidate for an open operation rather than a endovascular procedure.  I am concerned about the amount of thrombus in her infrarenal neck as well as the length of the infrarenal neck.  I discussed the details of an open operation including the risk including the risk of intestinal ischemia, lower extremity ischemia, cardiopulmonary complications, death, wound issues.  All other questions were answered.  She is on the schedule for this Thursday.  She had a low risk Myoview study  V. Leia Alf, M.D. [facility] [place] Office: [phone] Pager:  [phone]

## 2015-05-03 ENCOUNTER — Inpatient Hospital Stay (HOSPITAL_COMMUNITY): Payer: Medicare Other

## 2015-05-03 LAB — AMYLASE: AMYLASE: 39 U/L (ref 28–100)

## 2015-05-03 LAB — BLOOD GAS, ARTERIAL
ACID-BASE DEFICIT: 3.3 mmol/L — AB (ref 0.0–2.0)
Bicarbonate: 21.2 mEq/L (ref 20.0–24.0)
DRAWN BY: 236041
FIO2: 0.21
O2 Saturation: 89.5 %
PATIENT TEMPERATURE: 98.6
TCO2: 22.4 mmol/L (ref 0–100)
pCO2 arterial: 38.5 mmHg (ref 35.0–45.0)
pH, Arterial: 7.36 (ref 7.350–7.450)
pO2, Arterial: 56 mmHg — ABNORMAL LOW (ref 80.0–100.0)

## 2015-05-03 LAB — COMPREHENSIVE METABOLIC PANEL
ALBUMIN: 2.5 g/dL — AB (ref 3.5–5.0)
ALK PHOS: 47 U/L (ref 38–126)
ALT: 22 U/L (ref 14–54)
ANION GAP: 8 (ref 5–15)
AST: 45 U/L — ABNORMAL HIGH (ref 15–41)
BILIRUBIN TOTAL: 0.5 mg/dL (ref 0.3–1.2)
BUN: 14 mg/dL (ref 6–20)
CALCIUM: 7.5 mg/dL — AB (ref 8.9–10.3)
CO2: 22 mmol/L (ref 22–32)
Chloride: 109 mmol/L (ref 101–111)
Creatinine, Ser: 1.63 mg/dL — ABNORMAL HIGH (ref 0.44–1.00)
GFR calc non Af Amer: 29 mL/min — ABNORMAL LOW (ref >60)
GFR, EST AFRICAN AMERICAN: 34 mL/min — AB (ref >60)
Glucose, Bld: 185 mg/dL — ABNORMAL HIGH (ref 65–99)
POTASSIUM: 4 mmol/L (ref 3.5–5.1)
SODIUM: 139 mmol/L (ref 135–145)
TOTAL PROTEIN: 4.5 g/dL — AB (ref 6.5–8.1)

## 2015-05-03 LAB — CBC
HCT: 41.4 % (ref 36.0–46.0)
HEMOGLOBIN: 13.7 g/dL (ref 12.0–15.0)
MCH: 29.3 pg (ref 26.0–34.0)
MCHC: 33.1 g/dL (ref 30.0–36.0)
MCV: 88.5 fL (ref 78.0–100.0)
Platelets: 101 10*3/uL — ABNORMAL LOW (ref 150–400)
RBC: 4.68 MIL/uL (ref 3.87–5.11)
RDW: 15.7 % — AB (ref 11.5–15.5)
WBC: 11.1 10*3/uL — ABNORMAL HIGH (ref 4.0–10.5)

## 2015-05-03 LAB — MAGNESIUM: MAGNESIUM: 1.5 mg/dL — AB (ref 1.7–2.4)

## 2015-05-03 NOTE — Progress Notes (Signed)
   VASCULAR SURGERY ASSESSMENT & PLAN:  * 1 Day Post-Op s/p: Open repair of abdominal aortic aneurysm  *  CARDIAC: Hemodynamically stable  *  RESPIRATORY: Encourage IS.   * GI/NUTRITION: NG tube had 400 cc of drainage overnight. No bowel sounds yet. Discontinue NG tube when bowel function returns.  * THROMBOCYTOPENIA: Platelet count is 101,000. We will follow up in a.m. If this continues to drop only to hold her heparin.  * RENAL:  Urine output is marginal. Creatinine equals 1.54.   * Leave in ICU today. Can probably transfer out tomorrow.  SUBJECTIVE: Complains of incisional pain.  PHYSICAL EXAM: Filed Vitals:   05/03/15 0400 05/03/15 0500 05/03/15 0600 05/03/15 0700  BP: 111/57 99/43 91/47  96/43  Pulse: 66 69 70 67  Temp:      TempSrc:      Resp: 14 19 21 25   Height:      Weight:  172 lb 6.4 oz (78.2 kg)    SpO2: 94% 94% 92% 96%   I's and O's: NG tube drainage 400 cc overnight. 4.5 L positive yesterday.  Lungs clear bilaterally Incision looks fine Feet are warm and well-perfused. No mottling this morning.  LABS: Lab Results  Component Value Date   WBC 11.1* 05/03/2015   HGB 13.7 05/03/2015   HCT 41.4 05/03/2015   MCV 88.5 05/03/2015   PLT 101* 05/03/2015   Lab Results  Component Value Date   CREATININE 1.39* 05/02/2015   Lab Results  Component Value Date   INR 1.54* 05/02/2015    Active Problems:   AAA (abdominal aortic aneurysm) [facility])   Gae Gallop [id number] 05/03/2015

## 2015-05-03 NOTE — Op Note (Signed)
Patient name: Margaret Foley MRN: [id number] DOB: 06-10-1934 Sex: female  05/01/2015 - 05/02/2015 Pre-operative Diagnosis: Abdominal Aortic Aneurysm Post-operative diagnosis:  Same Surgeon:  Annamarie Major Assistants:  Lennie Muckle Procedure:   #1: Open Repair of Abdominal Aortic Aneurysm using a 18x9 bifurcated Dacron graft   #2:  Re-implantation of the inferior mesenteric artery to the left limb of the aortic graft Anesthesia:  General Blood Loss:  See anesthesia record Specimens:  none  Findings:  End to end proximal and distal anastamosis.  Both distal anastamoses were to the common iliac arteries.  Inadvertent injury to the SMA requiring exposure of the SMA at its base for primary repair of the SMA arteriotomy  Indications:  This is a 80 year old female with 5.1 cm AAA.  She was not a EVAR candidate due to her short neck with thrombus, which also excluded her from [facility]  Procedure:  The patient was identified in the holding area and taken to [place] [facility]  The patient was then placed supine on the table. general anesthesia was administered.  The patient was prepped and draped in the usual sterile fashion.  A time out was called and antibiotics were administered.  A midline incision was made from the xiphoid to below the umbilicus.  Cautery was used to divide the subcutaneous tissue down to the midline fascia the fascia was then opened with cautery.  The peritoneal cavity was then entered sharply.  This was then opened throughout the length of the incision.  The abdomen was carefully inspected.  No gross pathology was identified.  The NG tube was confirmed to be in the stomach.  A Balfour was used to aid with exposure as was an omni-tract.  The transverse colon was reflected cephalad and the small bowel was mobilized to the patient's right.  The ligament of Treitz was taken down sharply.  I divided the inferior mesenteric vein.  I then exposed the aneurysm.  I first dissected out bilateral  common iliac arteries.  There were moderately calcified.  The inferior mesenteric artery was isolated and encircled with a vessel loop.  I then exposed the infrarenal aortic neck up to the bilateral renal arteries.  The left renal vein was identified posterior to the aneurysm.  An umbilical tape was placed around the aortic neck.  The patient was then fully heparinized.  After the heparin had circulated, the iliac arteries were occluded initially with Henley clamps.  I then used a Harken clamp to occlude the infrarenal aorta at the level of the renal arteries.  A 11 blade was used to puncture the aneurysm sac which was then opened anteriorly in a longitudinal plane with Metzenbaum scissors.  The aneurysmal contents were then removed.  Several backbleeding lumbar arteries were suture ligated with 2-0 silk suture.  I then transected the aorta within the infrarenal neck.  I selected an 18 x 9 bifurcated dacryon graft.  I performed the proximal anastomosis in a end to end manner, placing 3 horizontal mattress 3-0 Prolene sutures posteriorly, incorporating a felt strip.  The lateral sutures were then used to complete the anastomosis, also incorporating the felt strip.  During this process, a rent within the superior mesenteric artery was encountered.  I was initially able to control this by inserting a #3 Fogarty catheter.  With hemostasis, I dissected out the superior mesenteric artery at its origin and then distal to the rent within the artery.  Once this was done I placed a  Hanley clamp on the proximal superior mesenteric artery and a vessel loop distally.  This provided hemostasis and then I placed 2 horizontal mattress sutures with pledgets to close the arteriotomy.  Once this was completed a Doppler was used to confirm excellent signal within the superior mesenteric artery.  I then completed the proximal anastomosis.  I did have to place additional repair stitches and the proximal anastomosis for hemostasis.  The  clamp was then released on the proximal aorta and the anastomosis was hemostatic.  I then transected the left common iliac artery and cut the graft to the appropriate length and performed a end to end anastomosis to the left common iliac artery with running 5-0 Prolene suture.  Prior to completion, the appropriate flushing maneuvers were performed and the anastomosis was completed.  Blood flow was then reestablished to the left leg.  Attention was then turned towards the right common iliac artery.  This was also transected in its midportion.  The graft was cut to the appropriate length and a end to end anastomosis was created with running 5-0 Prolene.  Prior to completion the appropriate flushing maneuvers were performed and the in anastomosis was completed.  Blood flow was reestablished to the right leg.  At this point I inspected the inferior mesenteric artery.  There was a meager backbleeding and therefore I felt it needed to be reimplanted.  I occluded the left limb of the bifurcated graft and made an arteriotomy.  A end to side anastomosis between the inferior mesenteric artery and the left limb of the graft was then completed with running 6-0 Prolene.  After appropriate flushing procedures were performed blood flow was reestablished to the inferior mesenteric artery.  There was an excellent signal and inferior mesenteric artery.  There was also good Doppler signals in the iliac arteries and palpable femoral pulses.  Next, the heparin was reversed with protamine.  The operative site was inspected for bleeding.  There was excellent hemostasis.  The aneurysm sac was then closed over top of the graft with 2-0 Vicryl.  I then closed the retroperitoneum over the graft with running 2-0 Vicryl.  The abdominal contents were placed back into the anatomic position.  The fascia was closed with 2 running #1 PDS suture.  The subcutaneous tissue was closed with 2-0 Vicryl followed by 4-0 Vicryl on the skin and Dermabond.   After the procedure the patient had a Doppler signal in her left posterior tibial artery as well as the right Rosas pedis and posterior tibial artery.  She was successfully extubated and taken to the recovery room in stable condition.  There were no immediate complications.   Disposition:  To PACU in stable condition.   Theotis Burrow, M.D. [facility] Office: [phone] Pager:  [phone]

## 2015-05-04 MED ORDER — ENOXAPARIN SODIUM 30 MG/0.3ML ~~LOC~~ SOLN
30.0000 mg | Freq: Every day | SUBCUTANEOUS | Status: DC
  Administered 2015-05-04 – 2015-05-08 (×5): 30 mg via SUBCUTANEOUS
  Filled 2015-05-04 (×5): qty 0.3

## 2015-05-04 MED ORDER — SODIUM CHLORIDE 0.9 % IV SOLN: 500.0000 mL | Freq: Once | INTRAVENOUS | Status: DC | PRN

## 2015-05-04 NOTE — Progress Notes (Signed)
Utilization Review Completed.Margaret Foley T12/18/2016  

## 2015-05-04 NOTE — Progress Notes (Signed)
   VASCULAR SURGERY ASSESSMENT & PLAN:  * 2 Days Post-Op s/p: Open AAA repair.  *  Renal function stable  * GI/Nutr: d/c NGT, keep NPO  * D/C foley  * Transfer to 2W  * Pulmonary: IS  * Continue to mobilize.   SUBJECTIVE: Was ambulating earlier this AM  PHYSICAL EXAM: Filed Vitals:   05/04/15 0700 05/04/15 0800 05/04/15 0900 05/04/15 1000  BP: 121/52 125/58 128/56 133/96  Pulse: 78 80 36 99  Temp:      TempSrc:      Resp: 17 22 16 28   Height:      Weight:      SpO2: 97% 98% 92% 92%   NGT = 30 cc last shift  Lungs clear Feet warm Incision looks fine Abdomen quiet  LABS: Lab Results  Component Value Date   WBC 11.1* 05/03/2015   HGB 13.7 05/03/2015   HCT 41.4 05/03/2015   MCV 88.5 05/03/2015   PLT 101* 05/03/2015   Lab Results  Component Value Date   CREATININE 1.63* 05/03/2015   Lab Results  Component Value Date   INR 1.54* 05/02/2015   CBG (last 3)  No results for input(s): GLUCAP in the last 72 hours.  Active Problems:   AAA (abdominal aortic aneurysm) [facility])    Gae Gallop [id number] 05/04/2015

## 2015-05-04 NOTE — Evaluation (Signed)
Physical Therapy Evaluation Patient Details Name: Margaret Foley MRN: [id number] DOB: 11/25/1934 Today's Date: 05/04/2015   History of Present Illness  Pt adm for AAA repair on 12/16. PMH - HTN, Rt total shoulder, COPD, PVD  Clinical Impression  Pt admitted with above diagnosis and presents to PT with functional limitations due to deficits listed below (See PT problem list). Pt needs skilled PT to maximize independence and safety to allow discharge to home vs ST-SNF. If pt progresses quickly, it may be possible to return home. If progress slow may need ST-SNF. Pt doesn't want to stay with family and prefers return home but is open to idea of ST-SNF if needed.     Follow Up Recommendations Other (comment) (TBA after next session)    Equipment Recommendations  Rolling walker with 5" wheels    Recommendations for Other Services       Precautions / Restrictions Precautions Precautions: Fall Restrictions Weight Bearing Restrictions: No      Mobility  Bed Mobility Overal bed mobility: Needs Assistance Bed Mobility: Supine to Sit     Supine to sit: Mod assist     General bed mobility comments: Assist to elevate trunk into sitting  Transfers Overall transfer level: Needs assistance Equipment used: Rolling walker (2 wheeled) Transfers: Sit to/from Stand Sit to Stand: Min assist         General transfer comment: Assist to bring hips up  Ambulation/Gait Ambulation/Gait assistance: Min guard Ambulation Distance (Feet): 130 Feet Assistive device: Rolling walker (2 wheeled) Gait Pattern/deviations: Step-through pattern;Decreased step length - right;Decreased step length - left Gait velocity: decr Gait velocity interpretation: Below normal speed for age/gender General Gait Details: Hesitant, guarded gait but good use of walker.SpO2 >93% on RA with amb  Stairs            Wheelchair Mobility    Modified Rankin (Stroke Patients Only)       Balance Overall  balance assessment: Needs assistance Sitting-balance support: No upper extremity supported;Feet supported Sitting balance-Leahy Scale: Good     Standing balance support: No upper extremity supported Standing balance-Leahy Scale: Fair                               Pertinent Vitals/Pain Pain Assessment: Faces Faces Pain Scale: Hurts whole lot (just with initial movement and then 2-4) Pain Location: abdomen Pain Descriptors / Indicators: Grimacing Pain Intervention(s): Limited activity within patient's tolerance;Repositioned    Home Living Family/patient expects to be discharged to:: Private residence Living Arrangements: Alone Available Help at Discharge: Family;Available PRN/intermittently Type of Home: House Home Access: Stairs to enter Entrance Stairs-Rails: Right Entrance Stairs-Number of Steps: 2 Home Layout: One level Home Equipment: None      Prior Function Level of Independence: Independent               Hand Dominance        Extremity/Trunk Assessment   Upper Extremity Assessment: Defer to OT evaluation           Lower Extremity Assessment: Generalized weakness         Communication   Communication: No difficulties  Cognition Arousal/Alertness: Awake/alert Behavior During Therapy: WFL for tasks assessed/performed Overall Cognitive Status: Within Functional Limits for tasks assessed                      General Comments      Exercises  Assessment/Plan    PT Assessment Patient needs continued PT services  PT Diagnosis Difficulty walking;Generalized weakness;Acute pain   PT Problem List Decreased strength;Decreased activity tolerance;Decreased balance;Decreased mobility;Pain  PT Treatment Interventions DME instruction;Gait training;Stair training;Functional mobility training;Therapeutic activities;Therapeutic exercise;Balance training;Patient/family education   PT Goals (Current goals can be found in the Care  Plan section) Acute Rehab PT Goals Patient Stated Goal: Return home PT Goal Formulation: With patient Time For Goal Achievement: 05/11/15 Potential to Achieve Goals: Good    Frequency Min 3X/week   Barriers to discharge        Co-evaluation               End of Session Equipment Utilized During Treatment: Gait belt Activity Tolerance: Patient tolerated treatment well;No increased pain Patient left: in chair;with call bell/phone within reach Nurse Communication: Mobility status;Other (comment) (left O2 off due to good SpO2)         Time: [id number] PT Time Calculation (min) (ACUTE ONLY): 32 min   Charges:   PT Evaluation $Initial PT Evaluation Tier I: 1 Procedure PT Treatments $Gait Training: 8-22 mins   PT G Codes:        Andreanna Mikolajczak 2015-05-09, 2:08 PM [facility] [phone]

## 2015-05-05 ENCOUNTER — Encounter (HOSPITAL_COMMUNITY): Payer: Self-pay | Admitting: Surgery

## 2015-05-05 LAB — TYPE AND SCREEN
ABO/RH(D): AB POS
ANTIBODY SCREEN: NEGATIVE
UNIT DIVISION: 0
UNIT DIVISION: 0
UNIT DIVISION: 0
Unit division: 0

## 2015-05-05 LAB — BASIC METABOLIC PANEL
ANION GAP: 5 (ref 5–15)
BUN: 16 mg/dL (ref 6–20)
CO2: 21 mmol/L — ABNORMAL LOW (ref 22–32)
Calcium: 8.1 mg/dL — ABNORMAL LOW (ref 8.9–10.3)
Chloride: 111 mmol/L (ref 101–111)
Creatinine, Ser: 1.91 mg/dL — ABNORMAL HIGH (ref 0.44–1.00)
GFR, EST AFRICAN AMERICAN: 28 mL/min — AB (ref >60)
GFR, EST NON AFRICAN AMERICAN: 24 mL/min — AB (ref >60)
GLUCOSE: 122 mg/dL — AB (ref 65–99)
POTASSIUM: 4.1 mmol/L (ref 3.5–5.1)
Sodium: 137 mmol/L (ref 135–145)

## 2015-05-05 LAB — CBC
HCT: 34.2 % — ABNORMAL LOW (ref 36.0–46.0)
HEMOGLOBIN: 11.4 g/dL — AB (ref 12.0–15.0)
MCH: 29.9 pg (ref 26.0–34.0)
MCHC: 33.3 g/dL (ref 30.0–36.0)
MCV: 89.8 fL (ref 78.0–100.0)
PLATELETS: 103 10*3/uL — AB (ref 150–400)
RBC: 3.81 MIL/uL — AB (ref 3.87–5.11)
RDW: 15.7 % — ABNORMAL HIGH (ref 11.5–15.5)
WBC: 10 10*3/uL (ref 4.0–10.5)

## 2015-05-05 MED ORDER — ALUM & MAG HYDROXIDE-SIMETH 200-200-20 MG/5ML PO SUSP: 30.0000 mL | ORAL | Status: DC | PRN

## 2015-05-05 MED FILL — Sodium Chloride IV Soln 0.9%: INTRAVENOUS | Qty: 1000 | Status: AC

## 2015-05-05 MED FILL — Heparin Sodium (Porcine) Inj 1000 Unit/ML: INTRAMUSCULAR | Qty: 30 | Status: AC

## 2015-05-05 NOTE — Progress Notes (Signed)
   VASCULAR SURGERY ASSESSMENT & PLAN:  * 3 Days Post-Op s/p: Open AAA repair.  * GI/Nutr: start sips of liquids  * Pulmonary: IS  * Continue to mobilize.   * Discontinue right IJ  * C. Difficile was negative. She has been having diarrhea. Start her on Kaopectate   SUBJECTIVE: Complains of diarrhea.  PHYSICAL EXAM: Filed Vitals:   05/04/15 1700 05/04/15 1730 05/04/15 1959 05/05/15 0446  BP: 117/52 137/57 116/54 157/59  Pulse: 83 96 96 89  Temp:  98.7 F (37.1 C) 99.3 F (37.4 C) 98.4 F (36.9 C)  TempSrc:  Oral Oral Oral  Resp: 17 20 20 20   Height:      Weight:      SpO2:  95% 94% 96%   Abdomen soft and nontender with normal pitched bowel sounds. Her incision looks fine. Feet are warm and well perfused.  LABS: Lab Results  Component Value Date   WBC 11.1* 05/03/2015   HGB 13.7 05/03/2015   HCT 41.4 05/03/2015   MCV 88.5 05/03/2015   PLT 101* 05/03/2015   Lab Results  Component Value Date   CREATININE 1.63* 05/03/2015   Lab Results  Component Value Date   INR 1.54* 05/02/2015   CBG (last 3)  No results for input(s): GLUCAP in the last 72 hours.  Active Problems:   AAA (abdominal aortic aneurysm) [facility])    Gae Gallop [id number] 05/05/2015

## 2015-05-05 NOTE — NC FL2 (Addendum)
[place] LEVEL OF CARE SCREENING TOOL     IDENTIFICATION  Patient Name: Margaret Foley Birthdate: July 12, 1934 Sex: female Admission Date (Current Location): 05/01/2015  [facility] and [place] Number:  Herbalist and Address:  [facility] [place] [place] [address], [place], [place] [address]      Provider Number: [id number]  Attending Physician Name and Address:  Serafina Mitchell, MD  Relative Name and Phone Number:       Current Level of Care: Hospital Recommended Level of Care: [place] Prior Approval Number:    Date Approved/Denied:   PASRR Number: [id number]  Discharge Plan: SNF    Current Diagnoses: Patient Active Problem List   Diagnosis Date Noted  . AAA (abdominal aortic aneurysm) ([place]) 05/02/2015  . LBBB (left bundle branch block) 04/16/2015  . Preoperative cardiovascular examination 04/16/2015  . AAA (abdominal aortic aneurysm) without rupture ([place]) 04/08/2014  . Aneurysm of abdominal vessel ([place]) 02/07/2012  . INSOMNIA, CHRONIC 05/19/2009  . PAROXYSMAL ATRIAL FIBRILLATION 02/04/2009  . CAROTID ARTERY DISEASE 02/04/2009  . MURMUR 02/01/2009  . CHEST PAIN, ATYPICAL, HX OF 02/01/2009  . HIP PAIN 10/28/2008  . COUGH 03/18/2008  . Essential hypertension 12/13/2007  . MYALGIA 12/13/2007  . PERIPHERAL VASCULAR DISEASE 04/10/2007  . GERD 04/10/2007  . VITAMIN B12 DEFICIENCY 01/05/2007  . VITAMIN D DEFICIENCY 01/05/2007  . Hyperlipidemia 01/05/2007  . TOBACCO USER 01/05/2007  . CORONARY ARTERY DISEASE 01/05/2007  . 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 BARRETT'S ESOPHAGUS 01/05/2007  . OSTEOARTHRITIS 01/05/2007  . LOW BACK PAIN 01/05/2007  . OSTEOPENIA 01/05/2007    Orientation RESPIRATION BLADDER Height & Weight    Self, Time, Situation, Place  Normal Incontinent 5\' 3"  (160 cm) 172 lbs.  BEHAVIORAL SYMPTOMS/MOOD NEUROLOGICAL BOWEL NUTRITION STATUS      Incontinent Diet (see DC  summary)  AMBULATORY STATUS COMMUNICATION OF NEEDS Skin   Limited Assist Verbally Surgical wounds                       Personal Care Assistance Level of Assistance  Bathing, Dressing Bathing Assistance: Limited assistance   Dressing Assistance: Limited assistance     Functional Limitations Info             SPECIAL CARE FACTORS FREQUENCY  PT (By licensed PT), OT (By licensed OT)     PT Frequency: 5/wk OT Frequency: 5/wk            Contractures      Additional Factors Info  Code Status, Allergies Code Status Info: FULL Allergies Info: Penicillins, Aleve, Benazepril Hcl, Carvedilol, Indomethacin, Pravastatin Sodium, Rosuvastatin, Simvastatin, Tramadol, Advil           Current Medications (05/05/2015):  This is the current hospital active medication list Current Facility-Administered Medications  Medication Dose Route Frequency Provider Last Rate Last Dose  . 0.9 %  sodium chloride infusion  500 mL Intravenous Once PRN Angelia Mould, MD      . acetaminophen (TYLENOL) tablet 325-650 mg  325-650 mg Oral Q4H PRN Hulen Shouts Rhyne, PA-C       Or  . acetaminophen (TYLENOL) suppository 325-650 mg  325-650 mg Rectal Q4H PRN Hulen Shouts Rhyne, PA-C      . alum & mag hydroxide-simeth (MAALOX/MYLANTA) 200-200-20 MG/5ML suspension 30 mL  30 mL Oral Q4H PRN Angelia Mould, MD      . bisacodyl (DULCOLAX) suppository 10 mg  10 mg  Rectal Daily PRN Samantha J Rhyne, PA-C      . dextrose 5 % and 0.45 % NaCl with KCl 20 mEq/L infusion   Intravenous Continuous Ulyses Amor, PA-C 125 mL/hr at 05/05/15 0700    . docusate sodium (COLACE) capsule 100 mg  100 mg Oral Daily Ulyses Amor, PA-C   100 mg at 05/03/15 [id number]  . enoxaparin (LOVENOX) injection 30 mg  30 mg Subcutaneous Daily Serafina Mitchell, MD   30 mg at 05/05/15 [id number]  . guaiFENesin-dextromethorphan (ROBITUSSIN DM) 100-10 MG/5ML syrup 15 mL  15 mL Oral Q4H PRN Samantha J Rhyne, PA-C      . hydrALAZINE  (APRESOLINE) injection 5 mg  5 mg Intravenous Q20 Min PRN Ulyses Amor, PA-C      . labetalol (NORMODYNE,TRANDATE) injection 10 mg  10 mg Intravenous Q10 min PRN Ulyses Amor, PA-C      . magnesium hydroxide (MILK OF MAGNESIA) suspension 30 mL  30 mL Oral Daily PRN Samantha J Rhyne, PA-C      . magnesium sulfate IVPB 2 g 50 mL  2 g Intravenous Daily PRN Ulyses Amor, PA-C      . metoprolol (LOPRESSOR) injection 2-5 mg  2-5 mg Intravenous Q2H PRN Ulyses Amor, PA-C      . morphine 2 MG/ML injection 2-5 mg  2-5 mg Intravenous Q1H PRN Ulyses Amor, PA-C   2 mg at 05/05/15 0301  . ondansetron (ZOFRAN) injection 4 mg  4 mg Intravenous Q6H PRN Hulen Shouts Rhyne, PA-C   4 mg at 05/02/15 1840  . oxyCODONE-acetaminophen (PERCOCET/ROXICET) 5-325 MG per tablet 1-2 tablet  1-2 tablet Oral Q4H PRN Ulyses Amor, PA-C      . pantoprazole (PROTONIX) EC tablet 40 mg  40 mg Oral Daily Hulen Shouts Rhyne, PA-C   40 mg at 05/05/15 [id number]  . phenol (CHLORASEPTIC) mouth spray 1 spray  1 spray Mouth/Throat PRN Samantha J Rhyne, PA-C      . potassium chloride SA (K-DUR,KLOR-CON) CR tablet 20-40 mEq  20-40 mEq Oral Once [employer], PA-C   20 mEq at 05/02/15 1700  . potassium chloride SA (K-DUR,KLOR-CON) CR tablet 20-40 mEq  20-40 mEq Oral Daily PRN Ulyses Amor, PA-C      . senna (SENOKOT) tablet 8.6 mg  1 tablet Oral BID Hulen Shouts Rhyne, PA-C   8.6 mg at 05/02/15 2200  . senna-docusate (Senokot-S) tablet 1 tablet  1 tablet Oral QHS PRN Ulyses Amor, PA-C         Discharge Medications: Please see discharge summary for a list of discharge medications.  Relevant Imaging Results:  Relevant Lab Results:   Additional Information SS#: [id number]  Cranford Mon, [place]

## 2015-05-05 NOTE — Care Management Important Message (Signed)
Important Message  Patient Details  Name: Margaret Foley MRN: [id number] Date of Birth: 1934-10-27   Medicare Important Message Given:  Yes    Jakota Manthei Abena 05/05/2015, 1:11 PM

## 2015-05-05 NOTE — Progress Notes (Signed)
Physical Therapy Treatment Patient Details Name: Margaret Foley MRN: [id number] DOB: [birth year] Today's Date: 05/05/2015    History of Present Illness Pt adm for AAA repair on 12/16. PMH - HTN, Rt total shoulder, COPD, PVD    PT Comments    Progressing steadily, but not ready to be at home alone.  Follow Up Recommendations  SNF;Other (comment);Supervision/Assistance - 24 hour (unless pt's family comes up with an alternative)     Equipment Recommendations  Rolling walker with 5" wheels    Recommendations for Other Services       Precautions / Restrictions Precautions Precautions: Fall Restrictions Weight Bearing Restrictions: No    Mobility  Bed Mobility Overal bed mobility: Needs Assistance Bed Mobility: Supine to Sit;Sit to Supine     Supine to sit: Min assist Sit to supine: Mod assist   General bed mobility comments: up in the chair on arrival, left with CNA on the [facility]  Transfers Overall transfer level: Needs assistance Equipment used: Rolling walker (2 wheeled) Transfers: Sit to/from Stand Sit to Stand: Min guard         General transfer comment: stuggle to transition up, but no assist needed  Ambulation/Gait Ambulation/Gait assistance: Min guard Ambulation Distance (Feet): 280 Feet Assistive device: Rolling walker (2 wheeled) Gait Pattern/deviations: Step-through pattern   Gait velocity interpretation: at or above normal speed for age/gender General Gait Details: Initially guarded, but able to increase spped appreciably when cued to pick up speed.   Stairs Stairs: Yes Stairs assistance: Min guard Stair Management: One rail Right;Step to pattern;Sideways Number of Stairs: 3 General stair comments: mild struggle, but safe  Wheelchair Mobility    Modified Rankin (Stroke Patients Only)       Balance Overall balance assessment: Needs assistance Sitting-balance support: No upper extremity supported Sitting balance-Leahy Scale: Good        Standing balance-Leahy Scale: Fair                      Cognition Arousal/Alertness: Awake/alert Behavior During Therapy: WFL for tasks assessed/performed Overall Cognitive Status: Within Functional Limits for tasks assessed                      Exercises      General Comments        Pertinent Vitals/Pain Pain Assessment: Faces Pain Score:  (9-10 after getting to EOB ) Faces Pain Scale: Hurts even more Pain Location: abdoment Pain Descriptors / Indicators: Sharp;Operative site guarding Pain Intervention(s): Monitored during session    Home Living Family/patient expects to be discharged to:: Private residence Living Arrangements: Alone Available Help at Discharge: Family;Available PRN/intermittently Type of Home: House Home Access: Stairs to enter Entrance Stairs-Rails: Right Home Layout: One level        Prior Function Level of Independence: Independent          PT Goals (current goals can now be found in the care plan section) Acute Rehab PT Goals Patient Stated Goal: get home PT Goal Formulation: With patient Time For Goal Achievement: 05/11/15 Potential to Achieve Goals: Good Progress towards PT goals: Progressing toward goals    Frequency  Min 3X/week    PT Plan Current plan remains appropriate    Co-evaluation             End of Session   Activity Tolerance: Patient tolerated treatment well;No increased pain Patient left: Other (comment);with family/visitor present (left on [facility] with CNA to assist)  Time: [id number] PT Time Calculation (min) (ACUTE ONLY): 24 min  Charges:  $Gait Training: 8-22 mins $Therapeutic Activity: 8-22 mins                    G Codes:      Stephaniemarie Stoffel, Tessie Fass 05/05/2015, 12:12 PM  05/05/2015  Donnella Sham, [place] [phone]  (pager)

## 2015-05-05 NOTE — Evaluation (Addendum)
Occupational Therapy Evaluation Patient Details Name: Margaret Foley MRN: [id number] DOB: Nov 05, 1934 Today's Date: 05/05/2015    History of Present Illness Pt adm for AAA repair on 12/16. PMH - HTN, Rt total shoulder, COPD, PVD   Clinical Impression   Pt s/p above. Pt independent with ADLs, PTA. Feel pt will benefit from acute OT to increase independence prior to d/c. Recommending SNF at this time, but may be able to progress to go home.    Follow Up Recommendations  SNF    Equipment Recommendations  Other (comment) (defer to next venue)    Recommendations for Other Services       Precautions / Restrictions Precautions Precautions: Fall Restrictions Weight Bearing Restrictions: No      Mobility Bed Mobility Overal bed mobility: Needs Assistance Bed Mobility: Supine to Sit;Sit to Supine     Supine to sit: Min assist Sit to supine: Mod assist   General bed mobility comments: assist given with trunk to come to sitting position; assist given with LEs and to help adjust trunk when returning to bed.  Transfers Overall transfer level: Needs assistance   Transfers: Sit to/from Stand Sit to Stand:  (Min assist-Min guard)         General transfer comment: RW in front for support. Assist to boost from bed    Balance  Used RW for ambulation. No LOB in session.                                          ADL Overall ADL's : Needs assistance/impaired     Grooming: Oral care;Set up;Supervision/safety;Sitting;Standing               Lower Body Dressing: Sit to/from stand;Moderate assistance   Toilet Transfer: Ambulation;RW;BSC (Min guard-ambulation; Min assist to stand from bed and Min guard to/from [facility])    Toileting- Water quality scientist and Hygiene: Min guard;Sit to/from stand       Functional mobility during ADLs: Min guard;Rolling walker General ADL Comments: Explained AE is available to assist with LB dressing.     Vision      Perception     Praxis      Pertinent Vitals/Pain Pain Assessment: 0-10 Pain Score:  (9-10 after getting to EOB from supine) Pain Location: abdomen Pain Descriptors / Indicators: Sore;Other (Comment) (stiffness) Pain Intervention(s): Monitored during session;Repositioned     Hand Dominance     Extremity/Trunk Assessment Upper Extremity Assessment Upper Extremity Assessment: Overall WFL for tasks assessed   Lower Extremity Assessment Lower Extremity Assessment: Defer to PT evaluation       Communication Communication Communication: HOH   Cognition Arousal/Alertness: Awake/alert Behavior During Therapy: WFL for tasks assessed/performed Overall Cognitive Status: Within Functional Limits for tasks assessed                     General Comments       Exercises       Shoulder Instructions      Home Living Family/patient expects to be discharged to:: Private residence vs skilled nursing facility Living Arrangements: Alone Available Help at Discharge: Family;Available PRN/intermittently Type of Home: House Home Access: Stairs to enter [employer] of Steps: 2 Entrance Stairs-Rails: Right Home Layout: One level     Bathroom Shower/Tub: Teacher, early years/pre:  (elevated toilet)  Prior Functioning/Environment Level of Independence: Independent             OT Diagnosis: Acute pain   OT Problem List: Pain;Decreased strength;Decreased activity tolerance;Decreased knowledge of use of DME or AE;Decreased knowledge of precautions   OT Treatment/Interventions: Self-care/ADL training;DME and/or AE instruction;Therapeutic activities;Patient/family education;Balance training;Energy conservation    OT Goals(Current goals can be found in the care plan section) Acute Rehab OT Goals Patient Stated Goal: get her dentures in and out OT Goal Formulation: With patient Time For Goal Achievement: 05/12/15 Potential to Achieve  Goals: Good ADL Goals Pt Will Perform Lower Body Bathing: sit to/from stand;with set-up (with or without AE) Pt Will Perform Lower Body Dressing: with set-up;sit to/from stand (with or without AE) Pt Will Transfer to Toilet: with modified independence;ambulating (elevated toilet)  OT Frequency: Min 2X/week   Barriers to D/C:            Co-evaluation              End of Session Equipment Utilized During Treatment: Rolling walker  Activity Tolerance: Patient tolerated treatment well Patient left: in bed;with call bell/phone within reach;with bed alarm set   Time: [phone] (some time spent on [facility] with pt having BM) OT Time Calculation (min): 22 min Charges:  OT General Charges $OT Visit: 1 Procedure OT Evaluation $Initial OT Evaluation Tier I: 1 Procedure G-CodesBenito Mccreedy OTR/L [id number] 05/05/2015, 10:05 AM

## 2015-05-05 NOTE — Clinical Social Work Placement (Signed)
   CLINICAL SOCIAL WORK PLACEMENT  NOTE  Date:  05/05/2015  Patient Details  Name: Margaret Foley MRN: [id number] Date of Birth: Dec 09, 1934  Clinical Social Work is seeking post-discharge placement for this patient at the [place] level of care (*CSW will initial, date and re-position this form in  chart as items are completed):  Yes   Patient/family provided with [place] Work Department's list of facilities offering this level of care within the geographic area requested by the patient (or if unable, by the patient's family).  Yes   Patient/family informed of their freedom to choose among providers that offer the needed level of care, that participate in Medicare, Medicaid or managed care program needed by the patient, have an available bed and are willing to accept the patient.  Yes   Patient/family informed of [facility]'s ownership interest in [facility] and [facility], as well as of the fact that they are under no obligation to receive care at these facilities.  PASRR submitted to EDS on       PASRR number received on       Existing PASRR number confirmed on 05/05/15     FL2 transmitted to all facilities in geographic area requested by pt/family on 05/05/15     FL2 transmitted to all facilities within larger geographic area on       Patient informed that his/her managed care company has contracts with or will negotiate with certain facilities, including the following:            Patient/family informed of bed offers received.  Patient chooses bed at       Physician recommends and patient chooses bed at      Patient to be transferred to   on  .  Patient to be transferred to facility by       Patient family notified on   of transfer.  Name of family member notified:        PHYSICIAN Please sign FL2     Additional Comment:    _______________________________________________ Cranford Mon, LCSW 05/05/2015, 6:16  PM

## 2015-05-05 NOTE — Clinical Social Work Note (Signed)
Clinical Social Work Assessment  Patient Details  Name: Margaret Foley MRN: [id number] Date of Birth: 06-10-1934  Date of referral:  05/05/15               Reason for consult:  Facility Placement                Permission sought to share information with:  Chartered certified accountant granted to share information::  Yes, Verbal Permission Granted  Name::        Agency::  [facility]  Relationship::     Contact Information:     Housing/Transportation Living arrangements for the past 2 months:  Single Family Home Source of Information:  Patient Patient Interpreter Needed:  None Criminal Activity/Legal Involvement Pertinent to Current Situation/Hospitalization:    Significant Relationships:  Adult Children Lives with:  Self Do you feel safe going back to the place where you live?  No Need for family participation in patient care:  No (Coment)  Care giving concerns: Pt lives alone and does not have 24 hour supervision/assistance following heart surgery   Social Worker assessment / plan:  CSW met with pt to discuss concerns about pt lack of supervision at home  Employment status:  Retired Forensic scientist:  Medicare PT Recommendations:  [place] / Referral to community resources:  [place]  Patient/Family's Response to care:  Pt agreeable to short term SNF while she recovers- has been to SNF in the past and agreeable to return- prefers [facility]  Patient/Family's Understanding of and Emotional Response to Diagnosis, Current Treatment, and Prognosis:  No questions or concerns  Emotional Assessment Appearance:  Appears stated age Attitude/Demeanor/Rapport:    Affect (typically observed):  Appropriate Orientation:  Oriented to Self, Oriented to Place, Oriented to  Time, Oriented to Situation Alcohol / Substance use:  Not Applicable Psych involvement (Current and /or in the community):     Discharge Needs   Concerns to be addressed:  Care Coordination Readmission within the last 30 days:  No Current discharge risk:  Physical Impairment, Lack of support system Barriers to Discharge:  Continued Medical Work up   [employer], [place] 05/05/2015, 6:15 PM

## 2015-05-06 MED ORDER — METRONIDAZOLE 500 MG PO TABS
500.0000 mg | ORAL_TABLET | Freq: Four times a day (QID) | ORAL | Status: DC
  Administered 2015-05-06 – 2015-05-08 (×8): 500 mg via ORAL
  Filled 2015-05-06 (×9): qty 1

## 2015-05-06 MED ORDER — LEVOFLOXACIN IN D5W 750 MG/150ML IV SOLN
750.0000 mg | INTRAVENOUS | Status: DC
  Administered 2015-05-06: 750 mg via INTRAVENOUS
  Filled 2015-05-06: qty 150

## 2015-05-06 MED ORDER — LEVOFLOXACIN 25 MG/ML PO SOLN: 500.0000 mg | Freq: Every day | ORAL | Status: DC

## 2015-05-06 NOTE — Clinical Documentation Improvement (Signed)
  Vascular Surgery  Can the diagnosis of acute renal insufficiency be further specified in progress notes and discharge summary?   Acute Renal Failure/Acute Kidney Injury  Acute Tubular Necrosis  Other  Clinically Undetermined  Document any associated diagnoses/conditions.   Supporting Information:  80 years old w/ history of HTN, PVD, Smoking S/p AAA repair 12/19 progress note GU: Acute renal insufficiency. Cr up to 1.9 yesterday. Repeat labs in am. Renal adjust meds. Continue with hydration   Component     Latest Ref Rng 05/02/2015 05/02/2015 05/03/2015 05/05/2015         2:25 PM  6:30 PM    BUN     6 - 20 mg/dL '10 11 14 16  '$ Creatinine     0.44 - 1.00 mg/dL 1.23 (H) 1.39 (H) 1.63 (H) 1.91 (H)        EGFR (Non-African Amer.)     >60 mL/min 41 (L) 35 (L) 29 (L) 24 (L)  EGFR (African American)     >60 mL/min 47 (L) 41 (L) 34 (L) 28 (L)   Treatment  Monitoring BMP I&O q1h   Please exercise your independent, professional judgment when responding. A specific answer is not anticipated or expected.   Thank You,  [place] [phone]

## 2015-05-06 NOTE — Progress Notes (Signed)
CSW spoke with Admissions at [facility] as this is facility of choice for patient/family. A bed will be available tomorrow if patient is medically stable.  Diet advanced to Soft today per discussion with RNCM.  Will monitor for possible d/c tomorrow and CSW services will assist with d/c.  Lorie Phenix. Pauline Good, [place]

## 2015-05-06 NOTE — Care Management Note (Signed)
Case Management Note  Patient Details  Name: Margaret Foley MRN: [id number] Date of Birth: 1934-12-01  Subjective/Objective:     Pt admitted with AAA               Action/Plan:  Pt is from home alone, pt has strong family support from son Ezzard Flax and wife Casimer Bilis in addition to neighbors.  CSW has been consulted for SNF placement per PT recommendation.  CM will continue to monitor for disposition needs   Expected Discharge Date:                  Expected Discharge Plan:  [place]  In-House Referral:  Clinical Social Work  Discharge planning Services  CM Consult  Post Acute Care Choice:    Choice offered to:     DME Arranged:    DME Agency:     HH Arranged:    [place] Agency:     Status of Service:  In process, will continue to follow  Medicare Important Message Given:  Yes Date Medicare IM Given:    Medicare IM give by:    Date Additional Medicare IM Given:    Additional Medicare Important Message give by:     If discussed at [place] of Stay Meetings, dates discussed:    Additional Comments:  Maryclare Labrador, RN 05/06/2015, 10:30 AM

## 2015-05-06 NOTE — Progress Notes (Addendum)
[facility]  Subjective  - Eating small breakfast this am.  Her new concern is the left great toe ingrown toe nail verses embolic occlusion.  Objective 138/68 78 98.1 F (36.7 C) (Oral) 20 96%  Intake/Output Summary (Last 24 hours) at 05/06/15 [id number] Last data filed at 05/05/15 1300  Gross per 24 hour  Intake    120 ml  Output      0 ml  Net    120 ml   Abdomin soft with positive BS Incision is healing well Feet warm well perfused Doppler biphasic PT/DP signals Left great toe with out openings in the skin, dark discoloration   Assessment/Planning: POD # 4 AAA open repair  Continue to advance diet as tolerates She has IBS and continues to have bouts of loose stools, neg C diff We will observe the left great toe for now Continue mobilization  Laurence Slate [facility] 05/06/2015 8:24 AM --  Laboratory Lab Results:  Recent Labs  05/05/15 0924  WBC 10.0  HGB 11.4*  HCT 34.2*  PLT 103*   BMET  Recent Labs  05/05/15 0924  NA 137  K 4.1  CL 111  CO2 21*  GLUCOSE 122*  BUN 16  CREATININE 1.91*  CALCIUM 8.1*    COAG Lab Results  Component Value Date   INR 1.54* 05/02/2015   INR 1.05 04/29/2015   INR 1.01 08/03/2010   No results found for: PTT    I have seen and examined the patient, POD#4, s/p open AAA GI:  Continues to have diarrhea.  Has a h/o IBS, but this is worse.  CDIFF negative.  Question this is ischemic in nature.  Will continue with hydration and add levaquin and flagyl.  regualar diet GU:  Acute renal injury, likely ATN.  Cr up to 1.9 yesterday.  Repeat labs in am.  Renal adjust meds.  Continue with hydration Prophylaxis:  Lovenox Statin:  allergic Continue with mobilization    [place] Jaymond Waage

## 2015-05-06 NOTE — Progress Notes (Signed)
ANTIBIOTIC CONSULT NOTE - INITIAL  Pharmacy Consult for Levaquin Indication: r/o colitis  Allergies  Allergen Reactions  . Penicillins Rash    Has patient had a PCN reaction causing immediate rash, facial/tongue/throat swelling, SOB or lightheadedness with hypotension: Yes Has patient had a PCN reaction causing severe rash involving mucus membranes or skin necrosis: No Has patient had a PCN reaction that required hospitalization No Has patient had a PCN reaction occurring within the last 10 years: No If all of the above answers are "NO", then may proceed with Cephalosporin use.   Tori Milks [Naproxen] Other (See Comments)    Caused scar tissue   . Benazepril Hcl     REACTION: cough  . Carvedilol     REACTION: ear buzzing ??  . Indomethacin   . Pravastatin Sodium     REACTION: achy  . Rosuvastatin     REACTION: buzzing  . Simvastatin     REACTION: myalgia  . Tramadol     Ears ringing?  [name] Kitchen Advil [Ibuprofen] Rash    Patient Measurements: Height: 5' 3.25" (160.7 cm) Weight: 172 lb 6.4 oz (78.2 kg) IBW/kg (Calculated) : 52.98 Vital Signs: Temp: 97.8 F (36.6 C) (12/20 1337) Temp Source: Oral (12/20 1337) BP: 153/58 mmHg (12/20 1337) Pulse Rate: 75 (12/20 1337) Intake/Output from previous day: 12/19 0701 - 12/20 0700 In: 120 [P.O.:120] Out: -  Intake/Output from this shift: Total I/O In: 120 [P.O.:120] Out: -   Labs:  Recent Labs  05/05/15 0924  WBC 10.0  HGB 11.4*  PLT 103*  CREATININE 1.91*   Estimated Creatinine Clearance: 24.2 mL/min (by C-G formula based on Cr of 1.91). No results for input(s): VANCOTROUGH, VANCOPEAK, VANCORANDOM, GENTTROUGH, GENTPEAK, GENTRANDOM, TOBRATROUGH, TOBRAPEAK, TOBRARND, AMIKACINPEAK, AMIKACINTROU, AMIKACIN in the last 72 hours.   Microbiology: Recent Results (from the past 720 hour(s))  Surgical pcr screen     Status: None   Collection Time: 04/29/15  1:48 PM  Result Value Ref Range Status   MRSA, PCR NEGATIVE NEGATIVE Final    Staphylococcus aureus NEGATIVE NEGATIVE Final    Comment:        The Xpert SA Assay (FDA approved for NASAL specimens in patients over 45 years of age), is one component of a comprehensive surveillance program.  Test performance has been validated by [facility] for patients greater than or equal to 16 year old. It is not intended to diagnose infection nor to guide or monitor treatment.   C difficile quick screen w PCR reflex     Status: None   Collection Time: 05/02/15  3:09 PM  Result Value Ref Range Status   C Diff antigen NEGATIVE NEGATIVE Final   C Diff toxin NEGATIVE NEGATIVE Final   C Diff interpretation Negative for toxigenic C. difficile  Final    Medical History: Past Medical History  Diagnosis Date  . Hypertension   . GERD (gastroesophageal reflux disease)   . Hyperlipidemia   . Heart murmur     av sclerosis and mild ar echo 2008  . Chest pain     atypical normal myovue 2008  . Hip pain   . Cough   . Myalgia and myositis, unspecified     statin intolerant  . Peripheral vascular disease ([place])   . 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 Tobacco use disorder   . Unspecified vitamin D deficiency   . Other B-complex deficiencies   . Barrett's esophagus   . Osteopenia   .  Osteoarthritis   . Lumbago   . CAD (coronary artery disease)   . Insomnia, unspecified   . COPD (chronic obstructive pulmonary disease) ([place])   . Carotid artery occlusion   . Cancer [facility])     Pre cancerous Uterine Tumor  . Atrial fibrillation ([place])   . Shortness of breath dyspnea   . Anxiety   . Depression   . Kidney stone   . History of hiatal hernia   . Fibromyalgia     Assessment: 80 YOF who presented on 12/16 for a planned abdominal aortic aneurysm repair. Post-op, the patient has diarrhea, CDiff is negative, MD now wanting pharmacy to dose Levaquin along with Flagyl per MD for r/o colitis. SCr 1.91, CrCl~20-25 ml/min.   Goal of  Therapy:  Proper antibiotics for infection/cultures adjusted for renal/hepatic function   Plan:  1. Start Levaquin 750 mg IV every 48 hours 2. Will continue to follow renal function, culture results, LOT, and antibiotic de-escalation plans   Alycia Rossetti, PharmD, BCPS Clinical Pharmacist Pager: [phone] 05/06/2015 3:31 PM

## 2015-05-07 LAB — BASIC METABOLIC PANEL
ANION GAP: 10 (ref 5–15)
BUN: 15 mg/dL (ref 6–20)
CHLORIDE: 112 mmol/L — AB (ref 101–111)
CO2: 17 mmol/L — AB (ref 22–32)
Calcium: 8.8 mg/dL — ABNORMAL LOW (ref 8.9–10.3)
Creatinine, Ser: 1.72 mg/dL — ABNORMAL HIGH (ref 0.44–1.00)
GFR, EST AFRICAN AMERICAN: 32 mL/min — AB (ref >60)
GFR, EST NON AFRICAN AMERICAN: 27 mL/min — AB (ref >60)
GLUCOSE: 103 mg/dL — AB (ref 65–99)
Potassium: 4.3 mmol/L (ref 3.5–5.1)
Sodium: 139 mmol/L (ref 135–145)

## 2015-05-07 LAB — CBC
HEMATOCRIT: 33.2 % — AB (ref 36.0–46.0)
HEMOGLOBIN: 11 g/dL — AB (ref 12.0–15.0)
MCH: 29.9 pg (ref 26.0–34.0)
MCHC: 33.1 g/dL (ref 30.0–36.0)
MCV: 90.2 fL (ref 78.0–100.0)
Platelets: 126 10*3/uL — ABNORMAL LOW (ref 150–400)
RBC: 3.68 MIL/uL — ABNORMAL LOW (ref 3.87–5.11)
RDW: 15.7 % — ABNORMAL HIGH (ref 11.5–15.5)
WBC: 5.1 10*3/uL (ref 4.0–10.5)

## 2015-05-07 NOTE — Progress Notes (Signed)
Physical Therapy Treatment Patient Details Name: Margaret Foley MRN: [id number] DOB: 1934-06-21 Today's Date: 05/07/2015    History of Present Illness Pt adm for AAA repair on 12/16. PMH - HTN, Rt total shoulder, COPD, PVD    PT Comments    Pt reported feeling more soreness along incision today but was willing to work with therapy. She continues to increase ambulation distance and activity tolerance. Pt reported no soreness at end of tx. Will continue to follow to improve functional mobility and confidence with ambulation.     Follow Up Recommendations  SNF;Other (comment);Supervision/Assistance - 24 hour     Equipment Recommendations  Rolling walker with 5" wheels    Recommendations for Other Services       Precautions / Restrictions Precautions Precautions: Fall Restrictions Weight Bearing Restrictions: No    Mobility  Bed Mobility               General bed mobility comments: in chair upon arrival  Transfers Overall transfer level: Modified independent   Transfers: Sit to/from Stand Sit to Stand: Modified independent (Device/Increase time)         General transfer comment: VCs to scoot to edge of seat, hand placement  Ambulation/Gait Ambulation/Gait assistance: Supervision Ambulation Distance (Feet): 400 Feet Assistive device: Rolling walker (2 wheeled) Gait Pattern/deviations: Step-through pattern   Gait velocity interpretation: at or above normal speed for age/gender General Gait Details: Pt preferred RW for stability but was able to amb around room without AD. VCs to keep head up and to increase speed.    Stairs            Wheelchair Mobility    Modified Rankin (Stroke Patients Only)       Balance Overall balance assessment: Needs assistance Sitting-balance support: No upper extremity supported Sitting balance-Leahy Scale: Good     Standing balance support: Bilateral upper extremity supported Standing balance-Leahy Scale:  Fair Standing balance comment: able to ambulate without RW but preferred RW                    Cognition Arousal/Alertness: Awake/alert Behavior During Therapy: WFL for tasks assessed/performed Overall Cognitive Status: Within Functional Limits for tasks assessed                      Exercises General Exercises - Lower Extremity Long Arc Quad: AROM;Both;15 reps;Seated Heel Slides: AROM;Both;10 reps;Seated Hip ABduction/ADduction: AROM;Both;15 reps;Seated Hip Flexion/Marching: AROM;Both;15 reps;Seated    General Comments General comments (skin integrity, edema, etc.): Incision appeared to be healing appropriately      Pertinent Vitals/Pain Pain Assessment: No/denies pain Pain Intervention(s): Monitored during session    Home Living                      Prior Function            PT Goals (current goals can now be found in the care plan section) Acute Rehab PT Goals Patient Stated Goal: get home PT Goal Formulation: With patient Time For Goal Achievement: 05/11/15 Potential to Achieve Goals: Good Progress towards PT goals: Progressing toward goals    Frequency  Min 3X/week    PT Plan Current plan remains appropriate    Co-evaluation             End of Session   Activity Tolerance: Patient tolerated treatment well Patient left: in chair;with call bell/phone within reach     Time: 1147-1212 PT Time Calculation (min) (ACUTE  ONLY): 25 min  Charges:                       G CodesHaynes Bast 05-11-15, 12:20 PM Haynes Bast, SPT 2015/05/11 12:25 PM

## 2015-05-07 NOTE — Progress Notes (Signed)
UR Completed. Shmiel Morton, RN, BSN.  [phone] 

## 2015-05-07 NOTE — Progress Notes (Signed)
[facility] can accept pt tomorrow if medically stable- Facility to complete paperwork with pt in the morning for admission.  Pt reports that her son will transport her to the facility.  CSW will continue to follow.  Domenica Reamer, [place] Social Worker [phone]

## 2015-05-07 NOTE — Progress Notes (Signed)
   VASCULAR SURGERY ASSESSMENT & PLAN:  * 5 Days Post-Op s/p: Open repair of abdominal aortic aneurysm.  *  Advanced to regular diet.  * Anticipate discharge tomorrow. She is considering going to [facility].   * Crt down to 1.7 today.  * on Levaquin and Flagyl.  SUBJECTIVE: 1 loose stool a day. Thus her diarrhea has significantly improved.  PHYSICAL EXAM: Filed Vitals:   05/06/15 1337 05/06/15 2003 05/07/15 0630 05/07/15 0647  BP: 153/58 151/66 176/72 142/80  Pulse: 75 72 76   Temp: 97.8 F (36.6 C) 97.9 F (36.6 C) 98.4 F (36.9 C)   TempSrc: Oral Oral Oral   Resp: 21 18 18    Height:      Weight:      SpO2: 98% 98% 99%    Incision looks fine. Normal pitched bowel sounds. Lungs are clear. Slight discoloration of left great toe is unchanged. She also had some discoloration of the right great toe and this has resolved.  LABS: Lab Results  Component Value Date   WBC 5.1 05/07/2015   HGB 11.0* 05/07/2015   HCT 33.2* 05/07/2015   MCV 90.2 05/07/2015   PLT 126* 05/07/2015   Lab Results  Component Value Date   CREATININE 1.72* 05/07/2015   Lab Results  Component Value Date   INR 1.54* 05/02/2015   Active Problems:   AAA (abdominal aortic aneurysm) [facility])   Gae Gallop [id number] 05/07/2015

## 2015-05-07 NOTE — Progress Notes (Signed)
Late Addendum to progress note--CHARGES    05/07/15 1220  PT Time Calculation  PT Start Time (ACUTE ONLY) 1147  PT Stop Time (ACUTE ONLY) 1212  PT Time Calculation (min) (ACUTE ONLY) 25 min  PT General Charges  $$ ACUTE PT VISIT 1 Procedure  PT Treatments  $Gait Training 8-22 mins  $Therapeutic Exercise 8-22 mins

## 2015-05-08 DIAGNOSIS — R262 Difficulty in walking, not elsewhere classified: Secondary | ICD-10-CM | POA: Diagnosis not present

## 2015-05-08 DIAGNOSIS — R05 Cough: Secondary | ICD-10-CM | POA: Diagnosis not present

## 2015-05-08 DIAGNOSIS — I714 Abdominal aortic aneurysm, without rupture: Secondary | ICD-10-CM | POA: Diagnosis not present

## 2015-05-08 DIAGNOSIS — M1990 Unspecified osteoarthritis, unspecified site: Secondary | ICD-10-CM | POA: Diagnosis not present

## 2015-05-08 DIAGNOSIS — E46 Unspecified protein-calorie malnutrition: Secondary | ICD-10-CM | POA: Diagnosis not present

## 2015-05-08 DIAGNOSIS — E538 Deficiency of other specified B group vitamins: Secondary | ICD-10-CM | POA: Diagnosis not present

## 2015-05-08 DIAGNOSIS — D62 Acute posthemorrhagic anemia: Secondary | ICD-10-CM | POA: Diagnosis not present

## 2015-05-08 DIAGNOSIS — K529 Noninfective gastroenteritis and colitis, unspecified: Secondary | ICD-10-CM | POA: Diagnosis not present

## 2015-05-08 DIAGNOSIS — Z4789 Encounter for other orthopedic aftercare: Secondary | ICD-10-CM | POA: Diagnosis not present

## 2015-05-08 DIAGNOSIS — I10 Essential (primary) hypertension: Secondary | ICD-10-CM | POA: Diagnosis not present

## 2015-05-08 DIAGNOSIS — R109 Unspecified abdominal pain: Secondary | ICD-10-CM | POA: Diagnosis not present

## 2015-05-08 DIAGNOSIS — M6281 Muscle weakness (generalized): Secondary | ICD-10-CM | POA: Diagnosis not present

## 2015-05-08 DIAGNOSIS — N289 Disorder of kidney and ureter, unspecified: Secondary | ICD-10-CM | POA: Diagnosis not present

## 2015-05-08 DIAGNOSIS — R531 Weakness: Secondary | ICD-10-CM | POA: Diagnosis not present

## 2015-05-08 LAB — BASIC METABOLIC PANEL
Anion gap: 10 (ref 5–15)
BUN: 12 mg/dL (ref 6–20)
CALCIUM: 8.7 mg/dL — AB (ref 8.9–10.3)
CO2: 21 mmol/L — ABNORMAL LOW (ref 22–32)
CREATININE: 1.85 mg/dL — AB (ref 0.44–1.00)
Chloride: 110 mmol/L (ref 101–111)
GFR, EST AFRICAN AMERICAN: 29 mL/min — AB (ref >60)
GFR, EST NON AFRICAN AMERICAN: 25 mL/min — AB (ref >60)
Glucose, Bld: 92 mg/dL (ref 65–99)
Potassium: 3.9 mmol/L (ref 3.5–5.1)
SODIUM: 141 mmol/L (ref 135–145)

## 2015-05-08 MED ORDER — OXYCODONE-ACETAMINOPHEN 5-325 MG PO TABS
1.0000 | ORAL_TABLET | ORAL | Status: DC | PRN
Start: 2015-05-08 — End: 2015-10-07

## 2015-05-08 MED ORDER — LEVOFLOXACIN 500 MG PO TABS: 500.0000 mg | ORAL_TABLET | ORAL | Status: AC

## 2015-05-08 MED ORDER — LEVOFLOXACIN 500 MG PO TABS: 500.0000 mg | ORAL_TABLET | ORAL | Status: DC

## 2015-05-08 MED ORDER — METRONIDAZOLE 500 MG PO TABS: 500.0000 mg | ORAL_TABLET | Freq: Four times a day (QID) | ORAL | Status: AC

## 2015-05-08 NOTE — Progress Notes (Addendum)
[facility] [place]  Subjective  - She had a hard night secondary to voiding multiple times.  IV fluids where stopped.  She is tolerating PO's well.   Objective 152/70 72 99.5 F (37.5 C) (Oral) 18 96%  Intake/Output Summary (Last 24 hours) at 05/08/15 [id number] Last data filed at 05/08/15 [id number]  Gross per 24 hour  Intake 8260.83 ml  Output   1450 ml  Net 6810.83 ml    Palpable left DP, feet warm well perfused Abdomin is soft and non tender, positive BS to auscultation Lungs non labored breathing  Left great toe with dusky color, tender to touch, no change  Assessment/Planning: POD # 6 Open AAA  Plan discharge to SNF today C dif is negative she was started on Levaquin 500 mg q 48 and Flagyl 500 mg q 6  treatment of colitis  I spoke to pharmacy and we will continue these for 5 more days  Abdomin soft, tolerating PO regular diet  Cr fluctuating 1.72 now 1.85, IV fluids stopped last night  Will discuss discharge with Dr. Scot Dock F/u with Dr. Trula Slade in 4 weeks   Theda Sers Greene 05/08/2015 7:09 AM --  Laboratory Lab Results:  Recent Labs  05/05/15 0924 05/07/15 0409  WBC 10.0 5.1  HGB 11.4* 11.0*  HCT 34.2* 33.2*  PLT 103* 126*   BMET  Recent Labs  05/07/15 0409 05/08/15 0248  NA 139 141  K 4.3 3.9  CL 112* 110  CO2 17* 21*  GLUCOSE 103* 92  BUN 15 12  CREATININE 1.72* 1.85*  CALCIUM 8.8* 8.7*    COAG Lab Results  Component Value Date   INR 1.54* 05/02/2015   INR 1.05 04/29/2015   INR 1.01 08/03/2010   No results found for: PTT

## 2015-05-08 NOTE — Progress Notes (Signed)
Pt. Discharged to [facility] place Pt. being transferred via car with son Discharge information reviewed and given All personal belongings given to Pt.  Education discussed and given handouts  IV was d/c Tele d/c

## 2015-05-08 NOTE — Progress Notes (Signed)
Patient concerned about the amount of fluid she is receiving. On call MD paged. Ordered to saline lock IV. Patient was grateful.

## 2015-05-08 NOTE — Progress Notes (Signed)
ANTIBIOTIC CONSULT NOTE  Pharmacy Consult for Levaquin Indication: r/o colitis  Allergies  Allergen Reactions  . Penicillins Rash    Has patient had a PCN reaction causing immediate rash, facial/tongue/throat swelling, SOB or lightheadedness with hypotension: Yes Has patient had a PCN reaction causing severe rash involving mucus membranes or skin necrosis: No Has patient had a PCN reaction that required hospitalization No Has patient had a PCN reaction occurring within the last 10 years: No If all of the above answers are "NO", then may proceed with Cephalosporin use.   Tori Milks [Naproxen] Other (See Comments)    Caused scar tissue   . Benazepril Hcl     REACTION: cough  . Carvedilol     REACTION: ear buzzing ??  . Indomethacin   . Pravastatin Sodium     REACTION: achy  . Rosuvastatin     REACTION: buzzing  . Simvastatin     REACTION: myalgia  . Tramadol     Ears ringing?  [name] Kitchen Advil [Ibuprofen] Rash    Patient Measurements: Height: 5' 3.25" (160.7 cm) Weight: 172 lb 6.4 oz (78.2 kg) IBW/kg (Calculated) : 52.98 Vital Signs: Temp: 99.5 F (37.5 C) (12/22 0507) Temp Source: Oral (12/22 0507) BP: 152/70 mmHg (12/22 0621) Pulse Rate: 72 (12/22 0621) Intake/Output from previous day: 12/21 0701 - 12/22 0700 In: 8260.8 [P.O.:840; I.V.:7420.8] Out: 1450 [Urine:1450] Intake/Output from this shift:    Labs:  Recent Labs  05/05/15 0924 05/07/15 0409 05/08/15 0248  WBC 10.0 5.1  --   HGB 11.4* 11.0*  --   PLT 103* 126*  --   CREATININE 1.91* 1.72* 1.85*   Estimated Creatinine Clearance: 25 mL/min (by C-G formula based on Cr of 1.85). No results for input(s): VANCOTROUGH, VANCOPEAK, VANCORANDOM, GENTTROUGH, GENTPEAK, GENTRANDOM, TOBRATROUGH, TOBRAPEAK, TOBRARND, AMIKACINPEAK, AMIKACINTROU, AMIKACIN in the last 72 hours.   Microbiology: Recent Results (from the past 720 hour(s))  Surgical pcr screen     Status: None   Collection Time: 04/29/15  1:48 PM  Result Value  Ref Range Status   MRSA, PCR NEGATIVE NEGATIVE Final   Staphylococcus aureus NEGATIVE NEGATIVE Final    Comment:        The Xpert SA Assay (FDA approved for NASAL specimens in patients over 67 years of age), is one component of a comprehensive surveillance program.  Test performance has been validated by [facility] for patients greater than or equal to 25 year old. It is not intended to diagnose infection nor to guide or monitor treatment.   C difficile quick screen w PCR reflex     Status: None   Collection Time: 05/02/15  3:09 PM  Result Value Ref Range Status   C Diff antigen NEGATIVE NEGATIVE Final   C Diff toxin NEGATIVE NEGATIVE Final   C Diff interpretation Negative for toxigenic C. difficile  Final   Assessment: 80 YOF who presented on 12/16 for a planned abdominal aortic aneurysm repair. She continues on levaquin and flagyl for colitis. Pt is afebrile and WBC is WNL. Scr is elevated. Pt is culture negative.   Goal of Therapy:  Proper antibiotics for infection/cultures adjusted for renal/hepatic function   Plan:  - Change levaquin to 500mg  PO Q48H to complete course  *Pharmacy will sign-off as no further dose adjustments are anticipated. Thank you for the consult!  Salome Arnt, PharmD, BCPS Pager # [phone] 05/08/2015 8:45 AM

## 2015-05-08 NOTE — Discharge Summary (Signed)
[facility] Discharge Summary   Patient ID:  Margaret Foley MRN: [id number] DOB/AGE: [birth year] 78 y.o.  Admit date: 05/01/2015 Discharge date: 05/08/2015 Date of Surgery: 05/01/2015 - 05/02/2015 Surgeon: Juliann Mule): Serafina Mitchell, MD  Admission Diagnosis: Abdominal aortic aneurysm I71.4 aneurysm aneurysm  Discharge Diagnoses:  Abdominal aortic aneurysm I71.4 aneurysm aneurysm  Secondary Diagnoses: Past Medical History  Diagnosis Date  . Hypertension   . GERD (gastroesophageal reflux disease)   . Hyperlipidemia   . Heart murmur     av sclerosis and mild ar echo 2008  . Chest pain     atypical normal myovue 2008  . Hip pain   . Cough   . Myalgia and myositis, unspecified     statin intolerant  . Peripheral vascular disease ([place])   . 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 Tobacco use disorder   . Unspecified vitamin D deficiency   . Other B-complex deficiencies   . Barrett's esophagus   . Osteopenia   . Osteoarthritis   . Lumbago   . CAD (coronary artery disease)   . Insomnia, unspecified   . COPD (chronic obstructive pulmonary disease) ([place])   . Carotid artery occlusion   . Cancer [facility])     Pre cancerous Uterine Tumor  . Atrial fibrillation ([place])   . Shortness of breath dyspnea   . Anxiety   . Depression   . Kidney stone   . History of hiatal hernia   . Fibromyalgia     Procedure(s): REPAIR ABDOMINAL AORTIC ANEURYSM  ,AORTA BI-ILIAC  Discharged Condition: good  HPI: The patient is back today for follow-up of her abdominal aortic aneurysm. This was initially detected on an ultrasound. There is a family history of aneurysmal disease in her mother. She has previously had carotid studies and lower extremity studies. There is no popliteal aneurysm carotid studies were normal  The patient suffers from hypertension and hypercholesterolemia. She does not take any medication for this  because particularly the statins caused a buzzing and ringing in her head. Her biggest complaint is that of arthritis.  Per CTA  She has a 5.1 cm infrarenal abdominal aortic aneurysm   Hospital Course:  Margaret Foley is a 80 y.o. female is S/P  Procedure(s): REPAIR ABDOMINAL AORTIC ANEURYSM  ,AORTA BI-ILIAC  PACU   Post- op AAA Extubated in OR Appears comfortable Slight mottling to both LE Doppler left PT, right DP/PT Moves all extremities  Transfer to ICU when appropriate Post op labs OK Family updated * 1 Day Post-Op s/p: Open repair of abdominal aortic aneurysm  * CARDIAC: Hemodynamically stable  * RESPIRATORY: Encourage IS.   * GI/NUTRITION: NG tube had 400 cc of drainage overnight. No bowel sounds yet. Discontinue NG tube when bowel function returns.  * THROMBOCYTOPENIA: Platelet count is 101,000. We will follow up in a.m. If this continues to drop only to hold her heparin.  * RENAL: Urine output is marginal. Creatinine equals 1.54.   * Leave in ICU today. Can probably transfer out tomorrow.  * 2 Days Post-Op s/p: Open AAA repair.  * Renal function stable  * GI/Nutr: d/c NGT, keep NPO  * D/C foley  * Transfer to 2W  * Pulmonary: IS  * Continue to mobilize.   * 3 Days Post-Op s/p: Open AAA repair.  * GI/Nutr: start sips of liquids  * Pulmonary: IS  * Continue to mobilize.   * Discontinue right IJ  *  C. Difficile was negative. She has been having diarrhea. Start her on Kaopectate   POD # 4 AAA open repair  Continue to advance diet as tolerates She has IBS and continues to have bouts of loose stools, neg C diff We will observe the left great toe for now Continue mobilization  * 5 Days Post-Op s/p: Open repair of abdominal aortic aneurysm.  * Advanced to regular diet.  * Anticipate discharge tomorrow. She is considering going to [facility].   * Crt down to 1.7 today.  Started treatment 05/06/2015:  C dif is negative she was started  on Levaquin 500 mg q 48 and Flagyl 500 mg q 6 treatment of colitis  I spoke to pharmacy and we will continue these for 5 more days   Significant Diagnostic Studies: CBC Lab Results  Component Value Date   WBC 5.1 05/07/2015   HGB 11.0* 05/07/2015   HCT 33.2* 05/07/2015   MCV 90.2 05/07/2015   PLT 126* 05/07/2015    BMET    Component Value Date/Time   NA 141 05/08/2015 0248   K 3.9 05/08/2015 0248   CL 110 05/08/2015 0248   CO2 21* 05/08/2015 0248   GLUCOSE 92 05/08/2015 0248   BUN 12 05/08/2015 0248   CREATININE 1.85* 05/08/2015 0248   CREATININE 0.89 04/02/2015 0720   CALCIUM 8.7* 05/08/2015 0248   GFRNONAA 25* 05/08/2015 0248   GFRAA 29* 05/08/2015 0248   COAG Lab Results  Component Value Date   INR 1.54* 05/02/2015   INR 1.05 04/29/2015   INR 1.01 08/03/2010     Disposition:  Discharge to :Skilled nursing facility    Medication List    ASK your doctor about these medications        aspirin 81 MG EC tablet  Take 81 mg by mouth daily.     calcium-vitamin D 500-200 MG-UNIT tablet  Commonly known as:  OSCAL WITH D  Take 1 tablet by mouth daily.     cyanocobalamin 1000 MCG tablet  Take 100 mcg by mouth daily.     multivitamin with minerals tablet  Take 1 tablet by mouth daily.     OMEGA 3 PO  Take 1 tablet by mouth daily.       Verbal and written Discharge instructions given to the patient. Wound care per Discharge AVS   Signed: Laurence Slate [facility] 05/08/2015, 7:30 AM  - For VQI Registry use --- Instructions: Press F2 to tab through selections.  Delete question if not applicable.   Post-op:  Time to Extubation: [x ] In OR, [ ]  < 12 hrs, [ ]  12-24 hrs, [ ]  >=24 hrs Vasopressors Req. Post-op: No ICU Stay: 2 days Transfusion: Yes  If yes, 2 units given MI: [x ] No, [ ]  Troponin only, [ ]  EKG or Clinical New Arrhythmia: No  Complications: CHF: No Resp failure: [ x] none, [ ]  Pneumonia, [ ]  Ventilator Chg in renal function: [x ] none,  [ ]  Inc. Cr > 0.5, [ ]  Temp. Dialysis, [ ]  Permanent dialysis Leg ischemia: [ ]  No, [x ] Yes, no Surgery needed, [ ]  Yes, Surgery needed, [ ]  Amputation Bowel ischemia: [x ] No, [ ]  Medical Rx, [ ]  Surgical Rx Wound complication: [ ]  No, [ ]  Superficial separation/infection, [ ]  Return to OR Return to OR: No  Return to OR for bleeding: No Stroke: [x ] None, [ ]  Minor, [ ]  Major  Discharge medications: Statin use:  No  for  medical reason   ASA use:  Yes Plavix use:  No  for medical reason   Beta blocker use: No  for medical reason

## 2015-05-08 NOTE — Care Management Note (Signed)
Case Management Note  Patient Details  Name: Margaret Foley MRN: [id number] Date of Birth: 19-Nov-1934  Subjective/Objective:     Pt admitted with AAA               Action/Plan:  Pt is from home alone, pt has strong family support from son Ezzard Flax and wife Casimer Bilis in addition to neighbors.  CSW has been consulted for SNF placement per PT recommendation.  CM will continue to monitor for disposition needs   Expected Discharge Date:                  Expected Discharge Plan:  [place]  In-House Referral:  Clinical Social Work  Discharge planning Services  CM Consult  Post Acute Care Choice:    Choice offered to:     DME Arranged:    DME Agency:     HH Arranged:    [place] Agency:     Status of Service:  In process, will continue to follow  Medicare Important Message Given:  Yes Date Medicare IM Given:    Medicare IM give by:    Date Additional Medicare IM Given:    Additional Medicare Important Message give by:     If discussed at [place] of Stay Meetings, dates discussed:  05/08/15  Additional Comments: Pt will transport to SNF today post discharge Maryclare Labrador, RN 05/08/2015, 8:28 AM

## 2015-05-08 NOTE — Progress Notes (Signed)
I have been attempting to call report to nurse and have been unsuccessful, so I left the charge nurse a message.

## 2015-05-08 NOTE — Clinical Social Work Placement (Signed)
   CLINICAL SOCIAL WORK PLACEMENT  NOTE  Date:  05/08/2015  Patient Details  Name: Margaret Foley MRN: [id number] Date of Birth: Aug 03, 1934  Clinical Social Work is seeking post-discharge placement for this patient at the [place] level of care (*CSW will initial, date and re-position this form in  chart as items are completed):  Yes   Patient/family provided with [place] Work Department's list of facilities offering this level of care within the geographic area requested by the patient (or if unable, by the patient's family).  Yes   Patient/family informed of their freedom to choose among providers that offer the needed level of care, that participate in Medicare, Medicaid or managed care program needed by the patient, have an available bed and are willing to accept the patient.  Yes   Patient/family informed of [place]'s ownership interest in [facility] and [facility], as well as of the fact that they are under no obligation to receive care at these facilities.  PASRR submitted to EDS on       PASRR number received on       Existing PASRR number confirmed on 05/05/15     FL2 transmitted to all facilities in geographic area requested by pt/family on 05/05/15     FL2 transmitted to all facilities within larger geographic area on       Patient informed that his/her managed care company has contracts with or will negotiate with certain facilities, including the following:        Yes   Patient/family informed of bed offers received.  Patient chooses bed at [facility]     Physician recommends and patient chooses bed at      Patient to be transferred to [facility] on 05/08/15.  Patient to be transferred to facility by Car with son     Patient family notified on 05/08/15 of transfer.  Name of family member notified:  Son:  Benton Buzan     PHYSICIAN Please sign FL2     Additional Comment:  Ok per MD for d/c today to SNF  for short term SNF.  Paperwork has been completed with SNF.  Nursing notified to call report to the facility and DC summary sent to the SNF for review.  Patient and son are very pleased with d/c plan and are ready for d/c.  No further CSW needs identified. CSW signing off.     _______________________________________________ Kendell Bane T, LCSW 05/08/2015, 2:30 PM [phone]

## 2015-05-09 ENCOUNTER — Non-Acute Institutional Stay (SKILLED_NURSING_FACILITY): Payer: Medicare Other | Admitting: Internal Medicine

## 2015-05-09 ENCOUNTER — Telehealth: Payer: Self-pay | Admitting: Surgery

## 2015-05-09 DIAGNOSIS — M1990 Unspecified osteoarthritis, unspecified site: Secondary | ICD-10-CM | POA: Diagnosis not present

## 2015-05-09 DIAGNOSIS — R531 Weakness: Secondary | ICD-10-CM

## 2015-05-09 DIAGNOSIS — R059 Cough, unspecified: Secondary | ICD-10-CM

## 2015-05-09 DIAGNOSIS — N289 Disorder of kidney and ureter, unspecified: Secondary | ICD-10-CM

## 2015-05-09 DIAGNOSIS — I714 Abdominal aortic aneurysm, without rupture, unspecified: Secondary | ICD-10-CM

## 2015-05-09 DIAGNOSIS — R05 Cough: Secondary | ICD-10-CM | POA: Diagnosis not present

## 2015-05-09 DIAGNOSIS — I10 Essential (primary) hypertension: Secondary | ICD-10-CM | POA: Diagnosis not present

## 2015-05-09 DIAGNOSIS — E538 Deficiency of other specified B group vitamins: Secondary | ICD-10-CM

## 2015-05-09 DIAGNOSIS — E46 Unspecified protein-calorie malnutrition: Secondary | ICD-10-CM

## 2015-05-09 DIAGNOSIS — K529 Noninfective gastroenteritis and colitis, unspecified: Secondary | ICD-10-CM

## 2015-05-09 DIAGNOSIS — D62 Acute posthemorrhagic anemia: Secondary | ICD-10-CM

## 2015-05-09 NOTE — Progress Notes (Signed)
Patient ID: Margaret Foley, female   DOB: [birth year], 80 y.o.   MRN: [id number]      [facility]  PCP: Margaret Kehr, MD  Code Status: full code  Allergies  Allergen Reactions  . Penicillins Rash    Has patient had a PCN reaction causing immediate rash, facial/tongue/throat swelling, SOB or lightheadedness with hypotension: Yes Has patient had a PCN reaction causing severe rash involving mucus membranes or skin necrosis: No Has patient had a PCN reaction that required hospitalization No Has patient had a PCN reaction occurring within the last 10 years: No If all of the above answers are "NO", then may proceed with Cephalosporin use.   Margaret Foley [Naproxen] Other (See Comments)    Caused scar tissue   . Benazepril Hcl     REACTION: cough  . Carvedilol     REACTION: ear buzzing ??  . Indomethacin   . Pravastatin Sodium     REACTION: achy  . Rosuvastatin     REACTION: buzzing  . Simvastatin     REACTION: myalgia  . Tramadol     Ears ringing?  [name] Kitchen Advil [Ibuprofen] Rash    Chief Complaint  Patient presents with  . New Admit To SNF     HPI:  80 y.o. patient is here for short term rehabilitation post hospital admission from 05/01/15-05/08/15 with abdominal aortic aneurysm 5.1 cm. She underwent surgical repair. She required 2 u prbc transfusion post op. She was also diagnosed to have colitis and was started on levaquin and flagyl. She is seen in her room today. She has not had a restful night as she had to wake up intermittently to use the restroom. She is not in a good mood today because she has been interrupted by CNA, nurses and therapy team. Denies any abdominal pain. On review of her chart, has been having elevated BP reading in the facility.  Review of Systems:  Constitutional: positive for fatigue. Negative for fever, chills, diaphoresis.  HENT: Negative for headache, congestion, nasal discharge, dysphagia  Eyes: Negative for eye pain,  blurred vision, double vision and discharge.  Respiratory: Negative for shortness of breath and wheezing.  has need to clear her throat for cough but unable to bring it up Cardiovascular: Negative for chest pain, palpitations, leg swelling.  Gastrointestinal: Negative for heartburn, nausea, vomiting, abdominal pain. Has 2 bowel movement this am. Denies melena or rectal bleed. Denies abdominal cramps Genitourinary: Negative for dysuria and flank pain.  Musculoskeletal: Negative for back pain, falls. Skin: Negative for itching, rash.  Neurological: Negative for dizziness, tingling, focal weakness Psychiatric/Behavioral: Negative for depression   Past Medical History  Diagnosis Date  . Hypertension   . GERD (gastroesophageal reflux disease)   . Hyperlipidemia   . Heart murmur     av sclerosis and mild ar echo 2008  . Chest pain     atypical normal myovue 2008  . Hip pain   . Cough   . Myalgia and myositis, unspecified     statin intolerant  . Peripheral vascular disease ([place])   . 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 Tobacco use disorder   . Unspecified vitamin D deficiency   . Other B-complex deficiencies   . Barrett's esophagus   . Osteopenia   . Osteoarthritis   . Lumbago   . CAD (coronary artery disease)   . Insomnia, unspecified   . COPD (chronic obstructive pulmonary disease) ([place])   .  Carotid artery occlusion   . Cancer [facility])     Pre cancerous Uterine Tumor  . Atrial fibrillation ([place])   . Shortness of breath dyspnea   . Anxiety   . Depression   . Kidney stone   . History of hiatal hernia   . Fibromyalgia    Past Surgical History  Procedure Laterality Date  . Cataract surgery  2011  . Abdominal hysterectomy  1986    Complete  . Joint replacement  2012    Right Shoulder and arm  . Colonoscopy    . Abdominal aortic aneurysm repair N/A 05/02/2015    Procedure: REPAIR ABDOMINAL AORTIC ANEURYSM  ,AORTA BI-ILIAC;   Surgeon: Margaret Mitchell, MD;  Location: [facility] OR;  Service: Vascular;  Laterality: N/A;   Social History:   reports that she has been smoking Cigarettes.  She has a 85.5 pack-year smoking history. She has never used smokeless tobacco. She reports that she does not drink alcohol or use illicit drugs.  Family History  Problem Relation Age of Onset  . Arthritis Mother   . Heart disease Mother   . Hypertension Mother   . Heart attack Mother   . Hyperlipidemia    . Heart disease Son   . Hypertension Son   . Cancer Father   . Hyperlipidemia Sister   . Cancer Brother     Medications:   Medication List       This list is accurate as of: 05/09/15  3:56 PM.  Always use your most recent med list.               aspirin 81 MG EC tablet  Take 81 mg by mouth daily.     calcium-vitamin D 500-200 MG-UNIT tablet  Commonly known as:  OSCAL WITH D  Take 1 tablet by mouth daily.     cyanocobalamin 1000 MCG tablet  Take 100 mcg by mouth daily.     levofloxacin 500 MG tablet  Commonly known as:  LEVAQUIN  Take 1 tablet (500 mg total) by mouth every other day.     metroNIDAZOLE 500 MG tablet  Commonly known as:  FLAGYL  Take 1 tablet (500 mg total) by mouth every 6 (six) hours.     multivitamin with minerals tablet  Take 1 tablet by mouth daily.     OMEGA 3 PO  Take 1 tablet by mouth daily.     oxyCODONE-acetaminophen 5-325 MG tablet  Commonly known as:  PERCOCET/ROXICET  Take 1 tablet by mouth every 4 (four) hours as needed for moderate pain.         Physical Exam: Filed Vitals:   05/09/15 1554  BP: 183/90  Pulse: 81  Temp: 98 F (36.7 C)  Resp: 18  SpO2: 97%    General- elderly female, well built, in no acute distress Head- normocephalic, atraumatic Nose- normal nasal mucosa, no maxillary or frontal sinus tenderness, no nasal discharge Throat- moist mucus membrane Eyes- PERRLA, EOMI, no pallor, no icterus, no discharge, normal conjunctiva, normal sclera Neck- no  cervical lymphadenopathy Cardiovascular- normal s1,s2, , palpable dorsalis pedis and radial pulses, no leg edema Respiratory- bilateral clear to auscultation, no use of accessory muscles Abdomen- bowel sounds present, soft, generalized mild tenderness on palpation, no guarding or rigidity Musculoskeletal- able to move all 4 extremities, generalized weakness, using Margaret Neurological- no focal deficit, alert and oriented to person, place and time Skin- warm and dry, abdominal wall surgical incision healing well, no drainage or bleed noted  Labs reviewed: Basic Metabolic Panel:  Recent Labs  05/02/15 1425  05/03/15 0615 05/05/15 0924 05/07/15 0409 05/08/15 0248  NA 141  < > 139 137 139 141  K 3.6  < > 4.0 4.1 4.3 3.9  CL 111  < > 109 111 112* 110  CO2 20*  < > 22 21* 17* 21*  GLUCOSE 134*  < > 185* 122* 103* 92  BUN 10  < > 14 16 15 12   CREATININE 1.23*  < > 1.63* 1.91* 1.72* 1.85*  CALCIUM 7.8*  < > 7.5* 8.1* 8.8* 8.7*  MG 1.6*  --  1.5*  --   --   --   < > = values in this interval not displayed. Liver Function Tests:  Recent Labs  04/29/15 1347 05/03/15 0615  AST 21 45*  ALT 14 22  ALKPHOS 77 47  BILITOT 0.5 0.5  PROT 6.5 4.5*  ALBUMIN 3.7 2.5*    Recent Labs  05/03/15 0615  AMYLASE 39   No results for input(s): AMMONIA in the last 8760 hours. CBC:  Recent Labs  05/03/15 0615 05/05/15 0924 05/07/15 0409  WBC 11.1* 10.0 5.1  HGB 13.7 11.4* 11.0*  HCT 41.4 34.2* 33.2*  MCV 88.5 89.8 90.2  PLT 101* 103* 126*     Assessment/Plan  Generalized weakness Post surgery, Will have patient work with PT/OT as tolerated to regain strength and restore function.  Fall precautions are in place.  Abdominal aortic aneurysm S/p surgical repair. Continue aspirin EC 81 mg daily. Continue percocet 5-325 mg q4h prn pain. Has f/u with vascular surgery.   Colitis Continue and complete course of levaquin 500 mg qod and flagyl 500 mg q6h on 05/13/15. Monitor bowel  movement. Encouraged to maintain hydration. Check cbc with diff and cmp  Acute blood loss anemia Post surgery, s/p 2 u prbc transfusion. Check cbc  HTN Elevated BP, currently not on any antihypertensives. Start amlodipine 5 mg daily, check bp q shift and adjust BP medication if needed with goal BP < 140/90  Cough With need to clear her throat intermittently. Add losenges for now on as needed basis and monitor  Acute renal impairment Monitor BMP, maintain hydration  OA Continue oscal with vitamin d and percocet prn pain as above  b12 deficiency Continue b12 supplement  Protein calorie malnutrition With her current surgery and colitis. Encourage po intake. Monitor clinically. Consider dietary consult if has poor po intake or weight loss. Monitor weight    Goals of care: short term rehabilitation   Labs/tests ordered: cbc with diff, cmp 05/13/15  Family/ staff Communication: reviewed care plan with patient and nursing supervisor    Blanchie Serve, MD  [place] [phone] (Monday-Friday 8 am - 5 pm) [phone] (afterhours)

## 2015-05-09 NOTE — Telephone Encounter (Signed)
No answer, no vm- mailed letter, dpm °

## 2015-05-09 NOTE — Telephone Encounter (Signed)
-----   Message from Mena Goes, RN sent at 05/08/2015  9:04 AM EST ----- Regarding: schedule   ----- Message -----    From: Ulyses Amor, PA-C    Sent: 05/08/2015   8:10 AM      To: Vvs Charge Pool  F/U with Dr. Trula Slade in 4 weeks s/p open AAA

## 2015-05-18 DIAGNOSIS — I10 Essential (primary) hypertension: Secondary | ICD-10-CM | POA: Diagnosis not present

## 2015-05-18 DIAGNOSIS — Z48812 Encounter for surgical aftercare following surgery on the circulatory system: Secondary | ICD-10-CM | POA: Diagnosis not present

## 2015-05-19 DIAGNOSIS — Z48812 Encounter for surgical aftercare following surgery on the circulatory system: Secondary | ICD-10-CM | POA: Diagnosis not present

## 2015-05-19 DIAGNOSIS — I10 Essential (primary) hypertension: Secondary | ICD-10-CM | POA: Diagnosis not present

## 2015-05-21 ENCOUNTER — Telehealth: Payer: Self-pay | Admitting: *Deleted

## 2015-05-21 NOTE — Telephone Encounter (Signed)
Timmothy Sours, OT with [facility] is requesting verbal orders for OT visits 2 x weeks for 1 week and 1 x week for 1 week for the following:  ADLs, transfer ADLs, to check O2 saturation level and to d/c once goals are met.  I am not familiar with this patient. Please advise.

## 2015-05-21 NOTE — Telephone Encounter (Signed)
Pt saw me last in 2011. She is likely to be using a different PCP Thx

## 2015-05-22 DIAGNOSIS — Z48812 Encounter for surgical aftercare following surgery on the circulatory system: Secondary | ICD-10-CM | POA: Diagnosis not present

## 2015-05-22 DIAGNOSIS — I10 Essential (primary) hypertension: Secondary | ICD-10-CM | POA: Diagnosis not present

## 2015-05-22 NOTE — Telephone Encounter (Signed)
Left detailed mess informing Margaret Foley of MD's advisement.

## 2015-05-23 DIAGNOSIS — Z48812 Encounter for surgical aftercare following surgery on the circulatory system: Secondary | ICD-10-CM | POA: Diagnosis not present

## 2015-05-23 DIAGNOSIS — I10 Essential (primary) hypertension: Secondary | ICD-10-CM | POA: Diagnosis not present

## 2015-05-27 DIAGNOSIS — Z48812 Encounter for surgical aftercare following surgery on the circulatory system: Secondary | ICD-10-CM | POA: Diagnosis not present

## 2015-05-27 DIAGNOSIS — I10 Essential (primary) hypertension: Secondary | ICD-10-CM | POA: Diagnosis not present

## 2015-05-28 DIAGNOSIS — I10 Essential (primary) hypertension: Secondary | ICD-10-CM | POA: Diagnosis not present

## 2015-05-28 DIAGNOSIS — Z48812 Encounter for surgical aftercare following surgery on the circulatory system: Secondary | ICD-10-CM | POA: Diagnosis not present

## 2015-05-30 ENCOUNTER — Encounter: Payer: Self-pay | Admitting: Surgery

## 2015-06-02 DIAGNOSIS — I10 Essential (primary) hypertension: Secondary | ICD-10-CM | POA: Diagnosis not present

## 2015-06-02 DIAGNOSIS — Z48812 Encounter for surgical aftercare following surgery on the circulatory system: Secondary | ICD-10-CM | POA: Diagnosis not present

## 2015-06-05 DIAGNOSIS — I10 Essential (primary) hypertension: Secondary | ICD-10-CM | POA: Diagnosis not present

## 2015-06-05 DIAGNOSIS — Z48812 Encounter for surgical aftercare following surgery on the circulatory system: Secondary | ICD-10-CM | POA: Diagnosis not present

## 2015-06-09 ENCOUNTER — Encounter: Payer: Self-pay | Admitting: Surgery

## 2015-06-09 ENCOUNTER — Ambulatory Visit (INDEPENDENT_AMBULATORY_CARE_PROVIDER_SITE_OTHER): Payer: Self-pay | Admitting: Surgery

## 2015-06-09 VITALS — BP 140/90 | HR 78 | Temp 97.0°F | Ht 63.0 in | Wt 154.0 lb

## 2015-06-09 DIAGNOSIS — I714 Abdominal aortic aneurysm, without rupture, unspecified: Secondary | ICD-10-CM

## 2015-06-09 NOTE — Progress Notes (Signed)
Patient name: Margaret Foley MRN: [id number] DOB: [birth year] Sex: female     Chief Complaint  Patient presents with  . Re-evaluation    4 wk f/u AAA    HISTORY OF PRESENT ILLNESS: It is the patient's first postoperative follow-up.  She is status post open repair of an abdominal aortic aneurysm using an 18 x 9 bifurcated dacryon graft with reimplantation of the inferior mesenteric artery into the left limb of the graft.  Aortic diameter was 5.1 cm.  Her postoperative course has been relatively uncomplicated.  She had a short stay at [facility].  Her biggest complaint is that of nausea.  She is not having any significant abdominal pain.  She has had some difficulty with eating because of her nausea but that has been getting better.  She has lost approximately 20 pounds.  She is able to eat.  She has normal bowel movements.  Past Medical History  Diagnosis Date  . Hypertension   . GERD (gastroesophageal reflux disease)   . Hyperlipidemia   . Heart murmur     av sclerosis and mild ar echo 2008  . Chest pain     atypical normal myovue 2008  . Hip pain   . Cough   . Myalgia and myositis, unspecified     statin intolerant  . Peripheral vascular disease ([place])   . 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 Tobacco use disorder   . Unspecified vitamin D deficiency   . Other B-complex deficiencies   . Barrett's esophagus   . Osteopenia   . Osteoarthritis   . Lumbago   . CAD (coronary artery disease)   . Insomnia, unspecified   . COPD (chronic obstructive pulmonary disease) ([place])   . Carotid artery occlusion   . Cancer [facility])     Pre cancerous Uterine Tumor  . Atrial fibrillation ([place])   . Shortness of breath dyspnea   . Anxiety   . Depression   . Kidney stone   . History of hiatal hernia   . Fibromyalgia     Past Surgical History  Procedure Laterality Date  . Cataract surgery  2011  . Abdominal hysterectomy  1986   Complete  . Joint replacement  2012    Right Shoulder and arm  . Colonoscopy    . Abdominal aortic aneurysm repair N/A 05/02/2015    Procedure: REPAIR ABDOMINAL AORTIC ANEURYSM  ,AORTA BI-ILIAC;  Surgeon: Serafina Mitchell, MD;  Location: [facility] OR;  Service: Vascular;  Laterality: N/A;    Social History   Social History  . Marital Status: Single    Spouse Name: N/A  . Number of Children: N/A  . Years of Education: N/A   Occupational History  . retired    Social History Main Topics  . Smoking status: Current Every Day Smoker -- 1.50 packs/day for 57 years    Types: Cigarettes  . Smokeless tobacco: Never Used     Comment: pt states she is "on again off again" not a constant smoker   . Alcohol Use: No  . Drug Use: No  . Sexual Activity: Not on file   Other Topics Concern  . Not on file   Social History Narrative   Regular exercise--yes daily stretching.    Family History  Problem Relation Age of Onset  . Arthritis Mother   . Heart disease Mother   . Hypertension Mother   . Heart attack Mother   .  Hyperlipidemia    . Heart disease Son   . Hypertension Son   . Cancer Father   . Hyperlipidemia Sister   . Cancer Brother     Allergies as of 06/09/2015 - Review Complete 06/09/2015  Allergen Reaction Noted  . Penicillins Rash   . Aleve [naproxen] Other (See Comments) 04/29/2015  . Benazepril hcl  07/16/2008  . Carvedilol  02/03/2009  . Indomethacin    . Pravastatin sodium  07/16/2008  . Rosuvastatin  08/19/2009  . Simvastatin  12/13/2007  . Tramadol  04/28/2015  . Advil [ibuprofen] Rash 04/08/2014    Current Outpatient Prescriptions on File Prior to Visit  Medication Sig Dispense Refill  . aspirin 81 MG EC tablet Take 81 mg by mouth daily.      . calcium-vitamin D (OSCAL WITH D) 500-200 MG-UNIT per tablet Take 1 tablet by mouth daily.      . cyanocobalamin 1000 MCG tablet Take 100 mcg by mouth daily.     . Multiple Vitamins-Minerals (MULTIVITAMIN WITH MINERALS)  tablet Take 1 tablet by mouth daily.    . Omega-3 Fatty Acids (OMEGA 3 PO) Take 1 tablet by mouth daily.      [name] Kitchen oxyCODONE-acetaminophen (PERCOCET/ROXICET) 5-325 MG tablet Take 1 tablet by mouth every 4 (four) hours as needed for moderate pain. 30 tablet 0   No current facility-administered medications on file prior to visit.     REVIEW OF SYSTEMS: Please see history of present illness otherwise negative   PHYSICAL EXAMINATION:   Vital signs are  Filed Vitals:   06/09/15 0823  BP: 140/90  Pulse: 78  Temp: 97 F (36.1 C)  TempSrc: Oral  Height: 5\' 3"  (1.6 m)  Weight: 154 lb (69.854 kg)  SpO2: 97%   Body mass index is 27.29 kg/(m^2). General: The patient appears their stated age. HEENT:  No gross abnormalities Pulmonary:  Non labored breathing Abdomen: Soft and non-tender midline incision is well-healed without hernia Musculoskeletal: There are no major deformities. Neurologic: No focal weakness or paresthesias are detected, Skin: There are no ulcer or rashes noted. Psychiatric: The patient has normal affect. Cardiovascular: Palpable pedal pulses   Diagnostic Studies None  Assessment: Status post open aneurysm repair Plan: Patient is recovering nicely.  Her biggest point is that of nausea which has been improving.  She is not taking any pain medication.  Her incisions are healing.  She has palpable pedal pulses.  I suspect she will continue to improve.  I will check back on her in 1 month.  The patient had a normal carotid Doppler study in 2012 and no evidence of popliteal aneurysm on preoperative imaging  V. Leia Alf, M.D. [facility] [place] Office: [phone] Pager:  [phone]

## 2015-07-14 ENCOUNTER — Ambulatory Visit: Payer: Medicare Other | Admitting: Surgery

## 2015-07-15 ENCOUNTER — Encounter: Payer: Self-pay | Admitting: Surgery

## 2015-07-21 ENCOUNTER — Ambulatory Visit (INDEPENDENT_AMBULATORY_CARE_PROVIDER_SITE_OTHER): Payer: Medicare Other | Admitting: Surgery

## 2015-07-21 ENCOUNTER — Encounter: Payer: Self-pay | Admitting: Surgery

## 2015-07-21 VITALS — BP 140/83 | HR 78 | Temp 97.9°F | Resp 14 | Ht 63.0 in | Wt 144.0 lb

## 2015-07-21 DIAGNOSIS — I714 Abdominal aortic aneurysm, without rupture, unspecified: Secondary | ICD-10-CM

## 2015-07-21 NOTE — Progress Notes (Signed)
Patient name: Margaret Foley MRN: [id number] DOB: 1934-09-27 Sex: female     Chief Complaint  Patient presents with  . Routine Post Op    HISTORY OF PRESENT ILLNESS: The patient is back for follow-up.She is status post open repair of an abdominal aortic aneurysm using an 18 x 9 bifurcated dacryon graft with reimplantation of the inferior mesenteric artery into the left limb of the graft. Aortic diameter was 5.1 cm. Her postoperative course has been relatively uncomplicated.  She had been complaining of some nausea, however this has improved as has her appetite.  She still complains of being dizzy when she gets up in the morning.  This is sometimes associated with nausea.  She is also concerned that her memory is not what it used to be.  Past Medical History  Diagnosis Date  . Hypertension   . GERD (gastroesophageal reflux disease)   . Hyperlipidemia   . Heart murmur     av sclerosis and mild ar echo 2008  . Chest pain     atypical normal myovue 2008  . Hip pain   . Cough   . Myalgia and myositis, unspecified     statin intolerant  . Peripheral vascular disease ([place])   . 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 Tobacco use disorder   . Unspecified vitamin D deficiency   . Other B-complex deficiencies   . Barrett's esophagus   . Osteopenia   . Osteoarthritis   . Lumbago   . CAD (coronary artery disease)   . Insomnia, unspecified   . COPD (chronic obstructive pulmonary disease) ([place])   . Carotid artery occlusion   . Cancer [facility])     Pre cancerous Uterine Tumor  . Atrial fibrillation ([place])   . Shortness of breath dyspnea   . Anxiety   . Depression   . Kidney stone   . History of hiatal hernia   . Fibromyalgia     Past Surgical History  Procedure Laterality Date  . Cataract surgery  2011  . Abdominal hysterectomy  1986    Complete  . Joint replacement  2012    Right Shoulder and arm  . Colonoscopy    .  Abdominal aortic aneurysm repair N/A 05/02/2015    Procedure: REPAIR ABDOMINAL AORTIC ANEURYSM  ,AORTA BI-ILIAC;  Surgeon: Serafina Mitchell, MD;  Location: [facility] OR;  Service: Vascular;  Laterality: N/A;    Social History   Social History  . Marital Status: Single    Spouse Name: N/A  . Number of Children: N/A  . Years of Education: N/A   Occupational History  . retired    Social History Main Topics  . Smoking status: Current Some Day Smoker -- 0.10 packs/day for 57 years    Types: Cigarettes  . Smokeless tobacco: Never Used     Comment: pt states she is "on again off again" not a constant smoker   . Alcohol Use: No  . Drug Use: No  . Sexual Activity: Not on file   Other Topics Concern  . Not on file   Social History Narrative   Regular exercise--yes daily stretching.    Family History  Problem Relation Age of Onset  . Arthritis Mother   . Heart disease Mother   . Hypertension Mother   . Heart attack Mother   . Hyperlipidemia    . Heart disease Son   . Hypertension Son   . Cancer  Father   . Hyperlipidemia Sister   . Cancer Brother     Allergies as of 07/21/2015 - Review Complete 07/21/2015  Allergen Reaction Noted  . Penicillins Rash   . Aleve [naproxen] Other (See Comments) 04/29/2015  . Benazepril hcl  07/16/2008  . Carvedilol  02/03/2009  . Indomethacin    . Pravastatin sodium  07/16/2008  . Rosuvastatin  08/19/2009  . Simvastatin  12/13/2007  . Tramadol  04/28/2015  . Advil [ibuprofen] Rash 04/08/2014    Current Outpatient Prescriptions on File Prior to Visit  Medication Sig Dispense Refill  . aspirin 81 MG EC tablet Take 81 mg by mouth daily.      . calcium-vitamin D (OSCAL WITH D) 500-200 MG-UNIT per tablet Take 1 tablet by mouth daily.      . cyanocobalamin 1000 MCG tablet Take 100 mcg by mouth daily.     . Multiple Vitamins-Minerals (MULTIVITAMIN WITH MINERALS) tablet Take 1 tablet by mouth daily.    . Omega-3 Fatty Acids (OMEGA 3 PO) Take 1 tablet  by mouth daily.      [name] Kitchen oxyCODONE-acetaminophen (PERCOCET/ROXICET) 5-325 MG tablet Take 1 tablet by mouth every 4 (four) hours as needed for moderate pain. (Patient not taking: Reported on 07/21/2015) 30 tablet 0   No current facility-administered medications on file prior to visit.       PHYSICAL EXAMINATION:   Vital signs are  Filed Vitals:   07/21/15 1002  BP: 140/83  Pulse: 78  Temp: 97.9 F (36.6 C)  TempSrc: Oral  Resp: 14  Height: 5\' 3"  (1.6 m)  Weight: 144 lb (65.318 kg)  SpO2: 96%   Body mass index is 25.51 kg/(m^2). General: The patient appears their stated age. HEENT:  No gross abnormalities Pulmonary:  Non labored breathing Abdomen: Soft and non-tender.  Midline incision healed without hernia Musculoskeletal: There are no major deformities. Neurologic: No focal weakness or paresthesias are detected, Skin: There are no ulcer or rashes noted. Psychiatric: The patient has normal affect. Cardiovascular: There is a regular rate and rhythm without significant murmur appreciated. Palpable femoral pulse  Diagnostic Studies None  Assessment: Status post open aneurysm repair Plan: Overall, I feel she is doing well.  I have encouraged her to see her primary care physician to further evaluate her memory issues.  I suspect that she is just taking a while to get over general anesthesia had a several hour operation.  I see significant improvements from when I saw her in late January.  I have her scheduled for follow-up in 3 months  V. Leia Alf, M.D. [facility] [place] Office: [phone] Pager:  [phone]

## 2015-10-07 ENCOUNTER — Ambulatory Visit (INDEPENDENT_AMBULATORY_CARE_PROVIDER_SITE_OTHER): Payer: Medicare Other | Admitting: Internal Medicine

## 2015-10-07 ENCOUNTER — Encounter: Payer: Self-pay | Admitting: Internal Medicine

## 2015-10-07 ENCOUNTER — Other Ambulatory Visit (INDEPENDENT_AMBULATORY_CARE_PROVIDER_SITE_OTHER): Payer: Medicare Other

## 2015-10-07 VITALS — BP 154/92 | HR 73 | Temp 97.9°F | Resp 16 | Wt 146.0 lb

## 2015-10-07 DIAGNOSIS — I10 Essential (primary) hypertension: Secondary | ICD-10-CM | POA: Diagnosis not present

## 2015-10-07 DIAGNOSIS — K2270 Barrett's esophagus without dysplasia: Secondary | ICD-10-CM

## 2015-10-07 LAB — CBC WITH DIFFERENTIAL/PLATELET
BASOS PCT: 0.4 % (ref 0.0–3.0)
Basophils Absolute: 0 10*3/uL (ref 0.0–0.1)
EOS PCT: 1.4 % (ref 0.0–5.0)
Eosinophils Absolute: 0.1 10*3/uL (ref 0.0–0.7)
HCT: 41.2 % (ref 36.0–46.0)
Hemoglobin: 13.8 g/dL (ref 12.0–15.0)
LYMPHS ABS: 1.9 10*3/uL (ref 0.7–4.0)
Lymphocytes Relative: 29.6 % (ref 12.0–46.0)
MCHC: 33.4 g/dL (ref 30.0–36.0)
MCV: 85.4 fl (ref 78.0–100.0)
MONO ABS: 0.6 10*3/uL (ref 0.1–1.0)
MONOS PCT: 9.6 % (ref 3.0–12.0)
NEUTROS ABS: 3.7 10*3/uL (ref 1.4–7.7)
NEUTROS PCT: 59 % (ref 43.0–77.0)
Platelets: 179 10*3/uL (ref 150.0–400.0)
RBC: 4.82 Mil/uL (ref 3.87–5.11)
RDW: 15.2 % (ref 11.5–15.5)
WBC: 6.3 10*3/uL (ref 4.0–10.5)

## 2015-10-07 LAB — COMPREHENSIVE METABOLIC PANEL
ALT: 11 U/L (ref 0–35)
AST: 17 U/L (ref 0–37)
Albumin: 4.3 g/dL (ref 3.5–5.2)
Alkaline Phosphatase: 68 U/L (ref 39–117)
BUN: 23 mg/dL (ref 6–23)
CHLORIDE: 110 meq/L (ref 96–112)
CO2: 23 meq/L (ref 19–32)
Calcium: 10 mg/dL (ref 8.4–10.5)
Creatinine, Ser: 1.37 mg/dL — ABNORMAL HIGH (ref 0.40–1.20)
GFR: 39.51 mL/min — AB (ref >60.00)
GLUCOSE: 95 mg/dL (ref 70–99)
POTASSIUM: 4.2 meq/L (ref 3.5–5.1)
SODIUM: 140 meq/L (ref 135–145)
TOTAL PROTEIN: 7.2 g/dL (ref 6.0–8.3)
Total Bilirubin: 0.5 mg/dL (ref 0.2–1.2)

## 2015-10-07 LAB — TSH: TSH: 1.38 u[IU]/mL (ref 0.35–4.50)

## 2015-10-07 MED ORDER — AMLODIPINE BESYLATE 2.5 MG PO TABS: 2.5000 mg | ORAL_TABLET | Freq: Every day | ORAL | Status: DC

## 2015-10-07 NOTE — Patient Instructions (Addendum)
  Test(s) ordered today. Your results will be released to [facility] (or called to you) after review, usually within 72hours after test completion. If any changes need to be made, you will be notified at that same time.  All other Health Maintenance issues reviewed.   All recommended immunizations and age-appropriate screenings are up-to-date or discussed.  No immunizations administered today.   Medications reviewed and updated.  Changes include starting amlodipine 2.5 mg daily.  Monitor your blood pressure at home.   Your prescription(s) have been submitted to your pharmacy. Please take as directed and contact our office if you believe you are having problem(s) with the medication(s).  A referral was ordered for GI.   Please followup in 4 weeks

## 2015-10-07 NOTE — Progress Notes (Signed)
Pre visit review using our clinic review tool, if applicable. No additional management support is needed unless otherwise documented below in the visit note. 

## 2015-10-07 NOTE — Progress Notes (Signed)
Subjective:  She   Patient ID: Margaret Foley, female    DOB: 08-18-1934, 81 y.o.   MRN: [id number]  HPI She is here to establish with a new pcp.    Since the AAA surgery in December 2016 she lost her sense of taste and smell.  She has lost 30 lbs.  She now feels like she is eating well - her taste and smell have returned.  She drinks a good amount of fluids during the day.    Since the surgery she has had dizziness with looking up and down and changing positions.  She has periods of feeling weak.  She often feels like she is walking toward the right when walking.  Hypertension: She is not taking medication daily - she has not been on medication for her BP in a while . She is compliant with a low sodium diet.  She denies chest pain, palpitations, edema, shortness of breath and regular headaches. She is not exercising regularly.  She does not monitor her blood pressure at home.      Medications and allergies reviewed with patient and updated if appropriate.  Patient Active Problem List   Diagnosis Date Noted  . AAA (abdominal aortic aneurysm) ([place]) 05/02/2015  . LBBB (left bundle branch block) 04/16/2015  . Preoperative cardiovascular examination 04/16/2015  . AAA (abdominal aortic aneurysm) without rupture ([place]) 04/08/2014  . Aneurysm of abdominal vessel ([place]) 02/07/2012  . INSOMNIA, CHRONIC 05/19/2009  . PAROXYSMAL ATRIAL FIBRILLATION 02/04/2009  . CAROTID ARTERY DISEASE 02/04/2009  . MURMUR 02/01/2009  . CHEST PAIN, ATYPICAL, HX OF 02/01/2009  . HIP PAIN 10/28/2008  . COUGH 03/18/2008  . Essential hypertension 12/13/2007  . MYALGIA 12/13/2007  . PERIPHERAL VASCULAR DISEASE 04/10/2007  . GERD 04/10/2007  . VITAMIN B12 DEFICIENCY 01/05/2007  . VITAMIN D DEFICIENCY 01/05/2007  . Hyperlipidemia 01/05/2007  . TOBACCO USER 01/05/2007  . CORONARY ARTERY DISEASE 01/05/2007  . 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 BARRETT'S ESOPHAGUS 01/05/2007  .  Osteoarthritis 01/05/2007  . LOW BACK PAIN 01/05/2007  . OSTEOPENIA 01/05/2007    Current Outpatient Prescriptions on File Prior to Visit  Medication Sig Dispense Refill  . aspirin 81 MG EC tablet Take 81 mg by mouth daily.      . calcium-vitamin D (OSCAL WITH D) 500-200 MG-UNIT per tablet Take 1 tablet by mouth daily.      . cyanocobalamin 1000 MCG tablet Take 100 mcg by mouth daily.     . Multiple Vitamins-Minerals (MULTIVITAMIN WITH MINERALS) tablet Take 1 tablet by mouth daily.    . Omega-3 Fatty Acids (OMEGA 3 PO) Take 1 tablet by mouth daily.       No current facility-administered medications on file prior to visit.    Past Medical History  Diagnosis Date  . Hypertension   . GERD (gastroesophageal reflux disease)   . Hyperlipidemia   . Heart murmur     av sclerosis and mild ar echo 2008  . Chest pain     atypical normal myovue 2008  . Hip pain   . Cough   . Myalgia and myositis, unspecified     statin intolerant  . Peripheral vascular disease ([place])   . 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 Tobacco use disorder   . Unspecified vitamin D deficiency   . Other B-complex deficiencies   . Barrett's esophagus   . Osteopenia   . Osteoarthritis   .  Lumbago   . CAD (coronary artery disease)   . Insomnia, unspecified   . COPD (chronic obstructive pulmonary disease) ([place])   . Carotid artery occlusion   . Cancer [facility])     Pre cancerous Uterine Tumor  . Atrial fibrillation ([place])   . Shortness of breath dyspnea   . Anxiety   . Depression   . Kidney stone   . History of hiatal hernia   . Fibromyalgia     Past Surgical History  Procedure Laterality Date  . Cataract surgery  2011  . Abdominal hysterectomy  1986    Complete  . Joint replacement  2012    Right Shoulder and arm  . Colonoscopy    . Abdominal aortic aneurysm repair N/A 05/02/2015    Procedure: REPAIR ABDOMINAL AORTIC ANEURYSM  ,AORTA BI-ILIAC;  Surgeon:  Serafina Mitchell, MD;  Location: [facility] OR;  Service: Vascular;  Laterality: N/A;    Social History   Social History  . Marital Status: Single    Spouse Name: N/A  . Number of Children: N/A  . Years of Education: N/A   Occupational History  . retired    Social History Main Topics  . Smoking status: Current Some Day Smoker -- 0.10 packs/day for 57 years    Types: Cigarettes  . Smokeless tobacco: Never Used     Comment: pt states she is "on again off again" not a constant smoker   . Alcohol Use: No  . Drug Use: No  . Sexual Activity: Not on file   Other Topics Concern  . Not on file   Social History Narrative   Regular exercise--yes daily stretching.    Family History  Problem Relation Age of Onset  . Arthritis Mother   . Heart disease Mother   . Hypertension Mother   . Heart attack Mother   . Hyperlipidemia    . Heart disease Son   . Hypertension Son   . Cancer Father   . Hyperlipidemia Sister   . Cancer Brother     Review of Systems  Constitutional: Positive for unexpected weight change (change after AAA surgery - lost sense of taste and smell). Negative for appetite change.  HENT: Negative for trouble swallowing.   Respiratory: Positive for cough (occasional) and wheezing (occasional). Negative for shortness of breath.   Cardiovascular: Positive for leg swelling (mild). Negative for chest pain and palpitations.  Gastrointestinal: Negative for abdominal pain.       No gerd  Musculoskeletal: Positive for back pain.  Neurological: Positive for dizziness and light-headedness (changing positions). Negative for headaches.       Objective:   Filed Vitals:   10/07/15 1300  BP: 154/92  Pulse: 73  Temp: 97.9 F (36.6 C)  Resp: 16   Filed Weights   10/07/15 1300  Weight: 146 lb (66.225 kg)   Body mass index is 25.87 kg/(m^2).   Physical Exam Constitutional: Appears well-developed and well-nourished. No distress.  Neck: Neck supple. No tracheal deviation  present. No thyromegaly present.  No carotid bruit. No cervical adenopathy.   Cardiovascular: Normal rate, regular rhythm and split S1, S2   3/6 murmur heard.  No edema Pulmonary/Chest: Effort normal and breath sounds normal. No respiratory distress. No wheezes.  Abdomen: healed scar from AAA surgery, soft, non tender, non distended       Assessment & Plan:   Dizziness/lightheadedness: Started after surgery in December - ? Related to elevated BP or not eating well Check labs Start BP  medication F/u in 4 weeks   See Problem List for Assessment and Plan of chronic medical problems.   F/u in 4 weeks

## 2015-10-07 NOTE — Assessment & Plan Note (Addendum)
Not controlled Cmp, cbc She will start monitoring her BP athom Start norvasc 2.5 mg daily Follow up in 4 weeks

## 2015-10-17 ENCOUNTER — Encounter: Payer: Self-pay | Admitting: Surgery

## 2015-10-20 ENCOUNTER — Encounter: Payer: Self-pay | Admitting: Gastroenterology

## 2015-10-27 ENCOUNTER — Ambulatory Visit (INDEPENDENT_AMBULATORY_CARE_PROVIDER_SITE_OTHER): Payer: Medicare Other | Admitting: Surgery

## 2015-10-27 VITALS — BP 159/76 | HR 65 | Temp 97.1°F | Resp 16 | Ht 64.0 in | Wt 150.0 lb

## 2015-10-27 DIAGNOSIS — I714 Abdominal aortic aneurysm, without rupture, unspecified: Secondary | ICD-10-CM

## 2015-10-27 NOTE — Progress Notes (Signed)
Vascular and Vein Specialist of   Patient name: Margaret Foley MRN: [id number] DOB: [birth year] Sex: female  REASON FOR VISIT: follow up  HPI: The patient is back for follow-up.She is status post open repair of an abdominal aortic aneurysm using an 18 x 9 bifurcated dacryon graft with reimplantation of the inferior mesenteric artery into the left limb of the graft. Aortic diameter was 5.1 cm. Her postoperative course has been relatively uncomplicated.   She is happy that her appetite has improved and that her memory is getting better.    Past Medical History  Diagnosis Date  . Hypertension   . GERD (gastroesophageal reflux disease)   . Hyperlipidemia   . Heart murmur     av sclerosis and mild ar echo 2008  . Chest pain     atypical normal myovue 2008  . Hip pain   . Cough   . Myalgia and myositis, unspecified     statin intolerant  . Peripheral vascular disease ([place])   . 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 Tobacco use disorder   . Unspecified vitamin D deficiency   . Other B-complex deficiencies   . Barrett's esophagus   . Osteopenia   . Osteoarthritis   . Lumbago   . CAD (coronary artery disease)   . Insomnia, unspecified   . COPD (chronic obstructive pulmonary disease) ([place])   . Carotid artery occlusion   . Cancer [facility])     Pre cancerous Uterine Tumor  . Atrial fibrillation ([place])   . Shortness of breath dyspnea   . Anxiety   . Depression   . Kidney stone   . History of hiatal hernia   . Fibromyalgia     Family History  Problem Relation Age of Onset  . Arthritis Mother   . Heart disease Mother   . Hypertension Mother   . Heart attack Mother   . Hyperlipidemia    . Heart disease Son   . Hypertension Son   . Cancer Father   . Hyperlipidemia Sister   . Cancer Brother     SOCIAL HISTORY: Social History  Substance Use Topics  . Smoking status: Current Some  Day Smoker -- 0.10 packs/day for 57 years    Types: Cigarettes  . Smokeless tobacco: Never Used     Comment: pt states she is "on again off again" not a constant smoker   . Alcohol Use: No    Allergies  Allergen Reactions  . Penicillins Rash    Has patient had a PCN reaction causing immediate rash, facial/tongue/throat swelling, SOB or lightheadedness with hypotension: Yes Has patient had a PCN reaction causing severe rash involving mucus membranes or skin necrosis: No Has patient had a PCN reaction that required hospitalization No Has patient had a PCN reaction occurring within the last 10 years: No If all of the above answers are "NO", then may proceed with Cephalosporin use.   Tori Milks [Naproxen] Other (See Comments)    Caused scar tissue   . Benazepril Hcl     REACTION: cough  . Carvedilol     REACTION: ear buzzing ??  . Indomethacin   . Pravastatin Sodium     REACTION: achy  . Rosuvastatin     REACTION: buzzing  . Simvastatin     REACTION: myalgia  . Tramadol     Ears ringing?  [name] Kitchen Advil [Ibuprofen] Rash    Current Outpatient Prescriptions  Medication Sig Dispense Refill  .  amLODipine (NORVASC) 2.5 MG tablet Take 1 tablet (2.5 mg total) by mouth daily. 90 tablet 3  . aspirin 81 MG EC tablet Take 81 mg by mouth daily.      . calcium-vitamin D (OSCAL WITH D) 500-200 MG-UNIT per tablet Take 1 tablet by mouth daily.      . cyanocobalamin 1000 MCG tablet Take 100 mcg by mouth daily.     . Multiple Vitamins-Minerals (MULTIVITAMIN WITH MINERALS) tablet Take 1 tablet by mouth daily.    . Omega-3 Fatty Acids (OMEGA 3 PO) Take 1 tablet by mouth daily.       No current facility-administered medications for this visit.    REVIEW OF SYSTEMS:  [X]  denotes positive finding, [ ]  denotes negative finding Cardiac  Comments:  Chest pain or chest pressure:    Shortness of breath upon exertion:    Short of breath when lying flat:    Irregular heart rhythm:        Vascular    Pain  in calf, thigh, or hip brought on by ambulation:    Pain in feet at night that wakes you up from your sleep:     Blood clot in your veins:    Leg swelling:         Pulmonary    Oxygen at home:    Productive cough:     Wheezing:         Neurologic    Sudden weakness in arms or legs:     Sudden numbness in arms or legs:  x Numbness on left foot   Sudden onset of difficulty speaking or slurred speech:    Temporary loss of vision in one eye:     Problems with dizziness:         Gastrointestinal    Blood in stool:     Vomited blood:         Genitourinary    Burning when urinating:     Blood in urine:        Psychiatric    Major depression:         Hematologic    Bleeding problems:    Problems with blood clotting too easily:        Skin    Rashes or ulcers:        Constitutional    Fever or chills:      PHYSICAL EXAM: Filed Vitals:   10/27/15 1158 10/27/15 1202  BP: 170/77 159/76  Pulse: 55 65  Temp: 97.1 F (36.2 C)   TempSrc: Oral   Resp: 16   Height: 5\' 4"  (1.626 m)   Weight: 150 lb (68.04 kg)   SpO2: 98%     GENERAL: The patient is a well-nourished female, in no acute distress. The vital signs are documented above. CARDIAC: There is a regular rate and rhythm.  VASCULAR: Palpable femoral pulses PULMONARY: There is good air exchange bilaterally without wheezing or rales. ABDOMEN: Soft and non-tender with normal pitched bowel sounds.  Incision is healing nicely with no evidence of hernia MUSCULOSKELETAL: There are no major deformities or cyanosis. NEUROLOGIC: No focal weakness or paresthesias are detected. SKIN: There are no ulcers or rashes noted. PSYCHIATRIC: The patient has a normal affect.  DATA:  None  MEDICAL ISSUES: Status post open repair of abdominal aortic aneurysm.  Patient continues to recover nicely.  I'm very pleased with her progress.  She will follow-up in one year with ABIs.  She will need a ultrasound of her  abdomen to evaluate for  aneurysmal degeneration of the proximal and distal anastomosis in 3 years    Annamarie Major, MD [facility] and Vein Specialists of [facility] [phone] Pager [phone]

## 2015-11-05 ENCOUNTER — Ambulatory Visit (INDEPENDENT_AMBULATORY_CARE_PROVIDER_SITE_OTHER): Payer: Medicare Other | Admitting: Internal Medicine

## 2015-11-05 ENCOUNTER — Encounter: Payer: Self-pay | Admitting: Internal Medicine

## 2015-11-05 VITALS — BP 182/90 | HR 60 | Temp 98.3°F | Wt 151.0 lb

## 2015-11-05 DIAGNOSIS — R42 Dizziness and giddiness: Secondary | ICD-10-CM | POA: Insufficient documentation

## 2015-11-05 DIAGNOSIS — I10 Essential (primary) hypertension: Secondary | ICD-10-CM | POA: Diagnosis not present

## 2015-11-05 MED ORDER — AMLODIPINE BESYLATE 2.5 MG PO TABS: 5.0000 mg | ORAL_TABLET | Freq: Every day | ORAL | Status: DC

## 2015-11-05 NOTE — Assessment & Plan Note (Addendum)
Not controlled, has renal insufficiency I think her BP has not been controlled for a while Increase amlodipine to 5 mg daily - will titrate medication slowly Has decreased sodium intake Follow up in 4 weeks cmp at next visit

## 2015-11-05 NOTE — Progress Notes (Signed)
Subjective:    Patient ID: Margaret Foley, female    DOB: [birth year], 81 y.o.   MRN: [id number]  HPI She is here for follow up.  Dizziness:  If she rolls over in bed or moves her quickly she feels dizzy.  The dizziness is transient and last a few seconds and then goes away.  She has been using otc ear wax drops to help remove some wax.  She has had the dizziness for 6 months since her AAA surgery.   Hypertension: She is taking her medication daily, which we started last month.  She denies side effects. She is compliant with a low sodium diet.  She denies chest pain, palpitations, edema and regular headaches. She is doing some yard work for exercise.  She does not monitor her blood pressure at home.      Medications and allergies reviewed with patient and updated if appropriate.  Patient Active Problem List   Diagnosis Date Noted  . AAA (abdominal aortic aneurysm) ([place]) 05/02/2015  . LBBB (left bundle branch block) 04/16/2015  . Preoperative cardiovascular examination 04/16/2015  . AAA (abdominal aortic aneurysm) without rupture ([place]) 04/08/2014  . Aneurysm of abdominal vessel ([place]) 02/07/2012  . INSOMNIA, CHRONIC 05/19/2009  . PAROXYSMAL ATRIAL FIBRILLATION 02/04/2009  . CAROTID ARTERY DISEASE 02/04/2009  . MURMUR 02/01/2009  . HIP PAIN 10/28/2008  . Essential hypertension 12/13/2007  . MYALGIA 12/13/2007  . PERIPHERAL VASCULAR DISEASE 04/10/2007  . VITAMIN B12 DEFICIENCY 01/05/2007  . VITAMIN D DEFICIENCY 01/05/2007  . Hyperlipidemia 01/05/2007  . TOBACCO USER 01/05/2007  . CORONARY ARTERY DISEASE 01/05/2007  . 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 Barrett's esophagus 01/05/2007  . Osteoarthritis 01/05/2007  . LOW BACK PAIN 01/05/2007  . OSTEOPENIA 01/05/2007    Current Outpatient Prescriptions on File Prior to Visit  Medication Sig Dispense Refill  . amLODipine (NORVASC) 2.5 MG tablet Take 1 tablet (2.5 mg total) by mouth daily. 90 tablet 3  .  aspirin 81 MG EC tablet Take 81 mg by mouth daily.      . calcium-vitamin D (OSCAL WITH D) 500-200 MG-UNIT per tablet Take 1 tablet by mouth daily.      . cyanocobalamin 1000 MCG tablet Take 100 mcg by mouth daily.     . Multiple Vitamins-Minerals (MULTIVITAMIN WITH MINERALS) tablet Take 1 tablet by mouth daily.    . Omega-3 Fatty Acids (OMEGA 3 PO) Take 1 tablet by mouth daily.       No current facility-administered medications on file prior to visit.    Past Medical History  Diagnosis Date  . Hypertension   . GERD (gastroesophageal reflux disease)   . Hyperlipidemia   . Heart murmur     av sclerosis and mild ar echo 2008  . Chest pain     atypical normal myovue 2008  . Hip pain   . Cough   . Myalgia and myositis, unspecified     statin intolerant  . Peripheral vascular disease ([place])   . 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 Tobacco use disorder   . Unspecified vitamin D deficiency   . Other B-complex deficiencies   . Barrett's esophagus   . Osteopenia   . Osteoarthritis   . Lumbago   . CAD (coronary artery disease)   . Insomnia, unspecified   . COPD (chronic obstructive pulmonary disease) ([place])   . Carotid artery occlusion   . Cancer ([place])  Pre cancerous Uterine Tumor  . Atrial fibrillation ([place])   . Shortness of breath dyspnea   . Anxiety   . Depression   . Kidney stone   . History of hiatal hernia   . Fibromyalgia     Past Surgical History  Procedure Laterality Date  . Cataract surgery  2011  . Abdominal hysterectomy  1986    Complete  . Joint replacement  2012    Right Shoulder and arm  . Colonoscopy    . Abdominal aortic aneurysm repair N/A 05/02/2015    Procedure: REPAIR ABDOMINAL AORTIC ANEURYSM  ,AORTA BI-ILIAC;  Surgeon: Serafina Mitchell, MD;  Location: [facility] OR;  Service: Vascular;  Laterality: N/A;    Social History   Social History  . Marital Status: Single    Spouse Name: N/A  . Number of  Children: N/A  . Years of Education: N/A   Occupational History  . retired    Social History Main Topics  . Smoking status: Current Some Day Smoker -- 0.10 packs/day for 57 years    Types: Cigarettes  . Smokeless tobacco: Never Used     Comment: pt states she is "on again off again" not a constant smoker   . Alcohol Use: No  . Drug Use: No  . Sexual Activity: Not Asked   Other Topics Concern  . None   Social History Narrative   Regular exercise--yes daily stretching.    Family History  Problem Relation Age of Onset  . Arthritis Mother   . Heart disease Mother   . Hypertension Mother   . Heart attack Mother   . Hyperlipidemia    . Heart disease Son   . Hypertension Son   . Cancer Father   . Hyperlipidemia Sister   . Cancer Brother     Review of Systems  Constitutional: Negative for fever and chills.  HENT: Negative for ear pain.   Respiratory: Positive for cough (occasional), shortness of breath (related to smoking, no daily sob, walks slowly) and wheezing (occasional).   Cardiovascular: Negative for chest pain, palpitations and leg swelling.  Neurological: Positive for dizziness. Negative for light-headedness and headaches.       Objective:   Filed Vitals:   11/05/15 1601  BP: 182/90  Pulse: 60  Temp: 98.3 F (36.8 C)   Filed Weights   11/05/15 1601  Weight: 151 lb (68.493 kg)   Body mass index is 25.91 kg/(m^2).   Physical Exam Constitutional: Appears well-developed and well-nourished. No distress.  Neck: b/l ear canals and TM normal.  Neck supple. No tracheal deviation present. No thyromegaly present.  No carotid bruit. No cervical adenopathy.   Cardiovascular: Normal rate, regular rhythm and normal heart sounds.   No murmur heard.  No edema Pulmonary/Chest: Effort normal and breath sounds normal. No respiratory distress. No wheezes.         Assessment & Plan:   See Problem List for Assessment and Plan of chronic medical problems.  F/u in  4 weeks

## 2015-11-05 NOTE — Assessment & Plan Note (Addendum)
Related to head movements and very transient Likely BPPV, but concern regarding duration of symptoms - 6 months Will do PT Will avoid meclizine since dizziness is so transient MRI of head to r/o cva - she has significant PVD and is high risk for a CVA

## 2015-11-05 NOTE — Patient Instructions (Addendum)
   Medications reviewed and updated.  Changes include increasing the amlodipine to 5 mg daily.   A  MRI of your head was ordered and we will call you to schedule this.   Please followup in 4 weeks

## 2015-11-05 NOTE — Progress Notes (Signed)
Pre visit review using our clinic review tool, if applicable. No additional management support is needed unless otherwise documented below in the visit note. 

## 2015-11-14 ENCOUNTER — Ambulatory Visit
Admission: RE | Admit: 2015-11-14 | Discharge: 2015-11-14 | Disposition: A | Discharge status: Home / Self Care | Payer: Medicare Other | Source: Ambulatory Visit | Attending: Internal Medicine | Admitting: Internal Medicine

## 2015-11-14 DIAGNOSIS — R42 Dizziness and giddiness: Secondary | ICD-10-CM

## 2015-11-17 ENCOUNTER — Other Ambulatory Visit: Payer: Self-pay | Admitting: Internal Medicine

## 2015-11-17 DIAGNOSIS — I739 Peripheral vascular disease, unspecified: Secondary | ICD-10-CM

## 2015-11-17 DIAGNOSIS — I779 Disorder of arteries and arterioles, unspecified: Secondary | ICD-10-CM

## 2015-11-17 DIAGNOSIS — R42 Dizziness and giddiness: Secondary | ICD-10-CM

## 2015-11-28 ENCOUNTER — Ambulatory Visit (HOSPITAL_COMMUNITY)
Admission: RE | Admit: 2015-11-28 | Discharge: 2015-11-28 | Disposition: A | Discharge status: Home / Self Care | Payer: Medicare Other | Source: Ambulatory Visit | Attending: Cardiovascular Disease | Admitting: Cardiovascular Disease

## 2015-11-28 DIAGNOSIS — I6523 Occlusion and stenosis of bilateral carotid arteries: Secondary | ICD-10-CM | POA: Insufficient documentation

## 2015-11-28 DIAGNOSIS — I739 Peripheral vascular disease, unspecified: Secondary | ICD-10-CM

## 2015-11-28 DIAGNOSIS — K219 Gastro-esophageal reflux disease without esophagitis: Secondary | ICD-10-CM | POA: Insufficient documentation

## 2015-11-28 DIAGNOSIS — Z720 Tobacco use: Secondary | ICD-10-CM | POA: Insufficient documentation

## 2015-11-28 DIAGNOSIS — I2510 Atherosclerotic heart disease of native coronary artery without angina pectoris: Secondary | ICD-10-CM | POA: Insufficient documentation

## 2015-11-28 DIAGNOSIS — J449 Chronic obstructive pulmonary disease, unspecified: Secondary | ICD-10-CM | POA: Diagnosis not present

## 2015-11-28 DIAGNOSIS — R42 Dizziness and giddiness: Secondary | ICD-10-CM

## 2015-11-28 DIAGNOSIS — G4700 Insomnia, unspecified: Secondary | ICD-10-CM | POA: Diagnosis not present

## 2015-11-28 DIAGNOSIS — E785 Hyperlipidemia, unspecified: Secondary | ICD-10-CM | POA: Insufficient documentation

## 2015-11-28 DIAGNOSIS — I10 Essential (primary) hypertension: Secondary | ICD-10-CM | POA: Insufficient documentation

## 2015-11-28 DIAGNOSIS — F329 Major depressive disorder, single episode, unspecified: Secondary | ICD-10-CM | POA: Insufficient documentation

## 2015-11-28 DIAGNOSIS — F419 Anxiety disorder, unspecified: Secondary | ICD-10-CM | POA: Insufficient documentation

## 2015-11-28 DIAGNOSIS — I779 Disorder of arteries and arterioles, unspecified: Secondary | ICD-10-CM

## 2015-12-03 ENCOUNTER — Encounter: Payer: Self-pay | Admitting: Internal Medicine

## 2015-12-03 ENCOUNTER — Ambulatory Visit (INDEPENDENT_AMBULATORY_CARE_PROVIDER_SITE_OTHER): Payer: Medicare Other | Admitting: Internal Medicine

## 2015-12-03 ENCOUNTER — Telehealth: Payer: Self-pay | Admitting: Emergency Medicine

## 2015-12-03 ENCOUNTER — Other Ambulatory Visit: Payer: Self-pay | Admitting: Internal Medicine

## 2015-12-03 VITALS — BP 180/100 | HR 66 | Temp 98.0°F | Resp 16 | Wt 153.0 lb

## 2015-12-03 DIAGNOSIS — R42 Dizziness and giddiness: Secondary | ICD-10-CM

## 2015-12-03 DIAGNOSIS — I10 Essential (primary) hypertension: Secondary | ICD-10-CM | POA: Diagnosis not present

## 2015-12-03 MED ORDER — LOSARTAN POTASSIUM 25 MG PO TABS: 25.0000 mg | ORAL_TABLET | Freq: Every day | ORAL | Status: DC

## 2015-12-03 NOTE — Patient Instructions (Addendum)
Get a blood pressure monitor from the pharmacy and start checking your blood pressure at home twice a day.  Call us with your numbers next week.   Your blood pressure should be less than 140/90.    Medications reviewed and updated.  Changes include adding losartan 25 mg daily.   Your prescription(s) have been submitted to your pharmacy. Please take as directed and contact our office if you believe you are having problem(s) with the medication(s).   Please followup in 2 months

## 2015-12-03 NOTE — Assessment & Plan Note (Signed)
Transient, related to head movements Started after surgery Will try PT - referred today MRI done Can refer to neuro if PT does not help

## 2015-12-03 NOTE — Assessment & Plan Note (Signed)
Uncontrolled Continue amlodipine 5 mg daily Start losartan 25 mg daily Start monitoring BP at home - goal < 140/90 given CKD cmp at next visit

## 2015-12-03 NOTE — Telephone Encounter (Signed)
Pt called back after visit to give name of PT   Margaret Foley [address] [place], [place] [phone] (fax)

## 2015-12-03 NOTE — Addendum Note (Signed)
Addended by: Binnie Rail on: 12/03/2015 08:44 PM   Modules accepted: Orders, SmartSet

## 2015-12-03 NOTE — Progress Notes (Signed)
Subjective:    Patient ID: Margaret Foley, female    DOB: 1935-05-13, 81 y.o.   MRN: [id number]  HPI She is here for follow up.   Hypertension: She is taking her medication daily. She is compliant with a low sodium diet.  She denies chest pain, palpitations, edema, shortness of breath and regular headaches. She is exercising regularly.  She does not monitor her blood pressure at home.    She has dizziness with bending over or when walking.  This is chronic.  It started after her last surgery.  We have discussed PT and she is willing to do it, but is not sure where she wants to do it.    Medications and allergies reviewed with patient and updated if appropriate.  Patient Active Problem List   Diagnosis Date Noted  . Dizziness 11/05/2015  . LBBB (left bundle branch block) 04/16/2015  . AAA (abdominal aortic aneurysm) without rupture ([place]) 04/08/2014  . INSOMNIA, CHRONIC 05/19/2009  . PAROXYSMAL ATRIAL FIBRILLATION 02/04/2009  . CAROTID ARTERY DISEASE 02/04/2009  . MURMUR 02/01/2009  . HIP PAIN 10/28/2008  . Essential hypertension 12/13/2007  . MYALGIA 12/13/2007  . PERIPHERAL VASCULAR DISEASE 04/10/2007  . VITAMIN B12 DEFICIENCY 01/05/2007  . VITAMIN D DEFICIENCY 01/05/2007  . Hyperlipidemia 01/05/2007  . TOBACCO USER 01/05/2007  . CORONARY ARTERY DISEASE 01/05/2007  . 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 Barrett's esophagus 01/05/2007  . Osteoarthritis 01/05/2007  . LOW BACK PAIN 01/05/2007  . OSTEOPENIA 01/05/2007    Current Outpatient Prescriptions on File Prior to Visit  Medication Sig Dispense Refill  . amLODipine (NORVASC) 2.5 MG tablet Take 2 tablets (5 mg total) by mouth daily. 90 tablet 3  . aspirin 81 MG EC tablet Take 81 mg by mouth daily.      . calcium-vitamin D (OSCAL WITH D) 500-200 MG-UNIT per tablet Take 1 tablet by mouth daily.      . cyanocobalamin 1000 MCG tablet Take 100 mcg by mouth daily.     . Multiple Vitamins-Minerals  (MULTIVITAMIN WITH MINERALS) tablet Take 1 tablet by mouth daily.    . Omega-3 Fatty Acids (OMEGA 3 PO) Take 1 tablet by mouth daily.       No current facility-administered medications on file prior to visit.    Past Medical History  Diagnosis Date  . Hypertension   . GERD (gastroesophageal reflux disease)   . Hyperlipidemia   . Heart murmur     av sclerosis and mild ar echo 2008  . Chest pain     atypical normal myovue 2008  . Hip pain   . Cough   . Myalgia and myositis, unspecified     statin intolerant  . Peripheral vascular disease ([place])   . 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 Tobacco use disorder   . Unspecified vitamin D deficiency   . Other B-complex deficiencies   . Barrett's esophagus   . Osteopenia   . Osteoarthritis   . Lumbago   . CAD (coronary artery disease)   . Insomnia, unspecified   . COPD (chronic obstructive pulmonary disease) ([place])   . Carotid artery occlusion   . Cancer [facility])     Pre cancerous Uterine Tumor  . Atrial fibrillation ([place])   . Shortness of breath dyspnea   . Anxiety   . Depression   . Kidney stone   . History of hiatal hernia   . Fibromyalgia  Past Surgical History  Procedure Laterality Date  . Cataract surgery  2011  . Abdominal hysterectomy  1986    Complete  . Joint replacement  2012    Right Shoulder and arm  . Colonoscopy    . Abdominal aortic aneurysm repair N/A 05/02/2015    Procedure: REPAIR ABDOMINAL AORTIC ANEURYSM  ,AORTA BI-ILIAC;  Surgeon: Serafina Mitchell, MD;  Location: [facility] OR;  Service: Vascular;  Laterality: N/A;    Social History   Social History  . Marital Status: Single    Spouse Name: N/A  . Number of Children: N/A  . Years of Education: N/A   Occupational History  . retired    Social History Main Topics  . Smoking status: Current Some Day Smoker -- 0.10 packs/day for 57 years    Types: Cigarettes  . Smokeless tobacco: Never Used     Comment:  pt states she is "on again off again" not a constant smoker   . Alcohol Use: No  . Drug Use: No  . Sexual Activity: Not Asked   Other Topics Concern  . None   Social History Narrative   Regular exercise--yes daily stretching.    Family History  Problem Relation Age of Onset  . Arthritis Mother   . Heart disease Mother   . Hypertension Mother   . Heart attack Mother   . Hyperlipidemia    . Heart disease Son   . Hypertension Son   . Cancer Father   . Hyperlipidemia Sister   . Cancer Brother     Review of Systems  Constitutional: Negative for fever.  Respiratory: Negative for cough, shortness of breath and wheezing.   Cardiovascular: Negative for chest pain, palpitations and leg swelling.  Gastrointestinal: Negative for abdominal pain.  Endocrine: Positive for cold intolerance.  Neurological: Positive for dizziness and numbness (bottom on left). Negative for headaches.       Objective:   Filed Vitals:   12/03/15 1427  BP: 180/100  Pulse: 66  Temp: 98 F (36.7 C)  Resp: 16   Filed Weights   12/03/15 1427  Weight: 153 lb (69.4 kg)   Body mass index is 26.25 kg/(m^2).   Physical Exam Constitutional: Appears well-developed and well-nourished. No distress.  Neck: Neck supple. No tracheal deviation present. No thyromegaly present.  No carotid bruit. No cervical adenopathy.   Cardiovascular: Normal rate, regular rhythm and normal heart sounds.   1/6 systolic murmur heard.  No edema Pulmonary/Chest: Effort normal and breath sounds normal. No respiratory distress. No wheezes.         Assessment & Plan:   See Problem List for Assessment and Plan of chronic medical problems.

## 2015-12-03 NOTE — Progress Notes (Signed)
Pre visit review using our clinic review tool, if applicable. No additional management support is needed unless otherwise documented below in the visit note. 

## 2015-12-03 NOTE — Telephone Encounter (Signed)
Referral ordered

## 2015-12-11 NOTE — Addendum Note (Signed)
Addended by: Thresa Ross C on: 12/11/2015 10:58 AM   Modules accepted: Orders

## 2015-12-16 ENCOUNTER — Ambulatory Visit (INDEPENDENT_AMBULATORY_CARE_PROVIDER_SITE_OTHER): Payer: Medicare Other | Admitting: Gastroenterology

## 2015-12-16 ENCOUNTER — Encounter: Payer: Self-pay | Admitting: Gastroenterology

## 2015-12-16 VITALS — BP 180/78 | HR 78 | Ht 64.0 in | Wt 154.0 lb

## 2015-12-16 DIAGNOSIS — K2270 Barrett's esophagus without dysplasia: Secondary | ICD-10-CM | POA: Diagnosis not present

## 2015-12-16 NOTE — Progress Notes (Signed)
Review of pertinent gastrointestinal problems:  1. ?Barrett's; Upper endoscopy Dr. Verl Blalock 2005; hiatal hernia, mild esophageal stricture, "3 cm of Barrett's appearing mucosa". Pathology showed "Barrett's esophagus" and also GERD. Upper endoscopy Dr. Verl Blalock 2007 showed hiatal hernia, no Barrett's change endoscopically, no biopsies were taken.  HPI: This is a  very pleasant 81 year old woman  who was referred to me by Binnie Rail, MD  to evaluate  Barrett's esophagus .    Chief complaint is Barrett's esophagus  Her PCP reminded her  She has dysphagia only very rarely, with large bites of food only.   No pyrosis.  She is not on any antiacid measures  Lost 30 pounds after AAA repair 6 months ago, it is starting to come back.    Review of systems: Pertinent positive and negative review of systems were noted in the above HPI section. Complete review of systems was performed and was otherwise normal.   Past Medical History:  Diagnosis Date  . Anxiety   . Atrial fibrillation ([place])   . Barrett's esophagus   . CAD (coronary artery disease)   . Cancer [facility])    Pre cancerous Uterine Tumor  . Carotid artery occlusion   . Chest pain    atypical normal myovue 2008  . COPD (chronic obstructive pulmonary disease) ([place])   . Cough   . Depression   . Fibromyalgia   . GERD (gastroesophageal reflux disease)   . Heart murmur    av sclerosis and mild ar echo 2008  . Hip pain   . History of hiatal hernia   . Hyperlipidemia   . Hypertension   . Insomnia, unspecified   . Kidney stone   . Lumbago   . Myalgia and myositis, unspecified    statin intolerant  . 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 Osteoarthritis   . Osteopenia   . Other B-complex deficiencies   . Peripheral vascular disease ([place])   . Shortness of breath dyspnea   . Tobacco use disorder   . Unspecified vitamin D deficiency     Past Surgical History:   Procedure Laterality Date  . ABDOMINAL AORTIC ANEURYSM REPAIR N/A 05/02/2015   Procedure: REPAIR ABDOMINAL AORTIC ANEURYSM  ,AORTA BI-ILIAC;  Surgeon: Serafina Mitchell, MD;  Location: [facility];  Service: Vascular;  Laterality: N/A;  . ABDOMINAL HYSTERECTOMY  1986   Complete  . cataract surgery  2011  . COLONOSCOPY    . JOINT REPLACEMENT  2012   Right Shoulder and arm    Current Outpatient Prescriptions  Medication Sig Dispense Refill  . amLODipine (NORVASC) 2.5 MG tablet Take 2 tablets (5 mg total) by mouth daily. 90 tablet 3  . amLODipine (NORVASC) 2.5 MG tablet TAKE 1 TABLET ONCE DAILY. 90 tablet 3  . aspirin 81 MG EC tablet Take 81 mg by mouth daily.      . calcium-vitamin D (OSCAL WITH D) 500-200 MG-UNIT per tablet Take 1 tablet by mouth daily.      . cyanocobalamin 1000 MCG tablet Take 100 mcg by mouth daily.     [name] Kitchen losartan (COZAAR) 25 MG tablet Take 1 tablet (25 mg total) by mouth daily. 30 tablet 5  . Multiple Vitamins-Minerals (MULTIVITAMIN WITH MINERALS) tablet Take 1 tablet by mouth daily.    . Omega-3 Fatty Acids (OMEGA 3 PO) Take 1 tablet by mouth daily.       No current facility-administered medications for this visit.  Allergies as of 12/16/2015 - Review Complete 12/16/2015  Allergen Reaction Noted  . Penicillins Rash   . Aleve [naproxen] Other (See Comments) 04/29/2015  . Benazepril hcl  07/16/2008  . Carvedilol  02/03/2009  . Indomethacin    . Pravastatin sodium  07/16/2008  . Rosuvastatin  08/19/2009  . Simvastatin  12/13/2007  . Tramadol  04/28/2015  . Advil [ibuprofen] Rash 04/08/2014    Family History  Problem Relation Age of Onset  . Arthritis Mother   . Heart disease Mother   . Hypertension Mother   . Heart attack Mother   . Hyperlipidemia    . Heart disease Son   . Hypertension Son   . Cancer Father   . Hyperlipidemia Sister   . Cancer Brother     Social History   Social History  . Marital status: Single    Spouse name: N/A  . Number  of children: N/A  . Years of education: N/A   Occupational History  . retired    Social History Main Topics  . Smoking status: Current Some Day Smoker    Packs/day: 0.10    Years: 57.00    Types: Cigarettes  . Smokeless tobacco: Never Used     Comment: pt states she is "on again off again" not a constant smoker   . Alcohol use No  . Drug use: No  . Sexual activity: Not on file   Other Topics Concern  . Not on file   Social History Narrative   Regular exercise--yes daily stretching.     Physical Exam: BP (!) 180/78 (BP Location: Left Arm, Cuff Size: Normal)   Pulse 78   Ht 5\' 4"  (1.626 m)   Wt 154 lb (69.9 kg)   BMI 26.43 kg/m  Constitutional: generally well-appearing Psychiatric: alert and oriented x3 Eyes: extraocular movements intact Mouth: oral pharynx moist, no lesions Neck: supple no lymphadenopathy Cardiovascular: heart regular rate and rhythm Lungs: clear to auscultation bilaterally Abdomen: soft, nontender, nondistended, no obvious ascites, no peritoneal signs, normal bowel sounds Extremities: no lower extremity edema bilaterally Skin: no lesions on visible extremities   Assessment and plan: 81 y.o. female with  Previous Barrett's esophagus  It is unusual for Barrett's change to simply regress. Dr. Sharlett Iles saw biopsy Barrett's change 2005 but in 2007 look normal. She has had some weight loss recently that was probably due to vascular surgery recovery. She also has very minor intermittent dysphagia with big bolus of food. I think to be safe here we should proceed with EGD at her soonest convenience check the status of her GE junction, see if she has Barrett's esophagus or GERD damage.   Owens Loffler, MD [facility] 12/16/2015, 1:53 PM  Cc: Binnie Rail, MD

## 2015-12-16 NOTE — Patient Instructions (Signed)
You will be set up for an upper endoscopy for Barrett's changes, surveillance.

## 2015-12-19 ENCOUNTER — Telehealth: Payer: Self-pay | Admitting: Internal Medicine

## 2015-12-19 NOTE — Telephone Encounter (Signed)
Patient called back in with BP readings:  *take medication around 7:30am every morning  7/30 2:30pm 184/95 pulse 94         6:36pm 153/76 pulse 91         9:32pm 167/81 pulse 77  7/31 8:50am 182/84 pulse 68         1:37pm 147/83 pulse 65         6:30pm 144/72 pulse 73  8/1  8:49am  188/92 pulse 75       12:21pm 181/91 pulse 80        4:10pm 125/64 pulse 86        8:22pm 169/82 pulse 69  8/2   8:30am 160/80 pulse 68         12:17pm 180/92 Pulse 63         4:35pm 164/85 Pulse 70         8:33pm 155/75 Pulse 69  8/2  9:21am 171/87 pulse 73         2:02pm 152/70 pulse 70         6:00pm 175/78 pulse 72         9:26pm 160/70 pulse 70  8/4  7:58am 170/89 pulse 70

## 2015-12-24 DIAGNOSIS — R42 Dizziness and giddiness: Secondary | ICD-10-CM | POA: Diagnosis not present

## 2015-12-24 NOTE — Telephone Encounter (Signed)
Increase losartan to 50 mg daily - can take all at once.  Confirm and update med list regarding dose of amlodipine.  Have her call us next week with readings.

## 2015-12-24 NOTE — Telephone Encounter (Signed)
Please advise 

## 2015-12-25 NOTE — Telephone Encounter (Signed)
Spoke with pt, will increase losartan to 50 mg daily. Is currently taking 2.5 mg of amlodipine.

## 2015-12-29 ENCOUNTER — Telehealth: Payer: Self-pay | Admitting: Internal Medicine

## 2015-12-29 MED ORDER — LOSARTAN POTASSIUM 50 MG PO TABS: 50.0000 mg | ORAL_TABLET | Freq: Every day | ORAL | 3 refills | Status: DC

## 2015-12-29 NOTE — Telephone Encounter (Signed)
Is she currently taking 5 mg of amlodipine daily?

## 2015-12-29 NOTE — Telephone Encounter (Signed)
Please advise 

## 2015-12-29 NOTE — Telephone Encounter (Signed)
Patient calling to give BP readings:  8/5  8:22am  175/91 pulse 65        12:31pm 154/79 pulse 65         4:34pm  139/78 pulse 75         8:30pm 166/85 pulse 71  8/6  9:57am 153/76 pulse 68        1:38pm 139/71 pulse 74        5:08pm 142/74 pulse 68        8:54pm 136/69 pulse 71  8/7  3:45am 178/96 pulse 63        9:02am 165/94 pulse 65        1:49pm 144/83 pulse 76        6:10pm 168/88 pulse 70        9:02pm 176/81 pulse 72  8/8   9:36am 172/90 pulse 73         1:02pm 152/77 pulse 66         4:49pm 171/91 pulse 71         8:45pm 183/94 pulse 65  8/9  8:58am 159/88 pulse 72        2:09pm 182/95 pulse 68        4:54pm 152/75 pulse 71        8:47pm 172/88 pulse 57  (increased medication this day to 50mg ) 8/10  9:36am 115/62 pulse 70          1:21pm 140/77 pulse 67           5:28pm 163/85 pulse 65          8:56pm 146/80 pulse 67  8/11  8:48am 134/71 pulse 70         11:23am 128/67 pulse 69           3:31pm 129/68 pulse 66          7:14pm 166/84 pulse 66  8/12  8:19am 138/79 pulse 70         11:47pm 140/81 pulse 65          3:36pm 155/80 pulse  66          7:19pm 164/82 pulse 79  8/13  8:16am 169/87 pulse 67          10:17am 156/83 pulse 64          1:00pm 177/96 pulse 65           4:02pm 173/85 pulse 66           7:06pm 158/75 pulse 65  8/14 8:10am 170/88 pulse 67  Patient always takes medication in the morning and then eats breakfast.  States she has been doing normal activities.  Does not know why bp was so good for two days.  Would like to know if there is any suggestions.

## 2015-12-30 MED ORDER — LOSARTAN POTASSIUM 50 MG PO TABS: 50.0000 mg | ORAL_TABLET | Freq: Every day | ORAL | 3 refills | Status: DC

## 2015-12-30 NOTE — Telephone Encounter (Signed)
Have her try increasing the amlodipine to 5 mg daily. She can take 5 mg all at once or 2.5 mg twice a day.

## 2015-12-30 NOTE — Telephone Encounter (Signed)
Tried calling pt back, unable to leave message, mail box full

## 2015-12-30 NOTE — Telephone Encounter (Signed)
Pt is currently taking 2.5 mg of amlodipine daily. Please advise.

## 2015-12-31 DIAGNOSIS — R42 Dizziness and giddiness: Secondary | ICD-10-CM | POA: Diagnosis not present

## 2015-12-31 NOTE — Telephone Encounter (Signed)
Spoke with pt to inform. Pt will call back later this week or Monday to give new readings.

## 2016-01-05 ENCOUNTER — Telehealth: Payer: Self-pay | Admitting: Emergency Medicine

## 2016-01-05 NOTE — Telephone Encounter (Signed)
Please advise if any changes should me made.

## 2016-01-05 NOTE — Telephone Encounter (Signed)
Spoke with pt to inform.  

## 2016-01-05 NOTE — Telephone Encounter (Signed)
Pt called to report blood pressure and pulse readings per Dr [name].  Aug 15th  9:18 am  156/92 67 12:56 pm 148/84 65 5:45 pm 168/89 89 11:18 pm 169/89 88  Aug 16th  8:29 am 158/83 75 2:56 pm 129/75 81 6:06 pm 134/73 71 9:20 pm 151/89 62  Aug 17th  8:15 am 105/61 73 12:03 pm 136/76 68 3:08 pm 140/73 64 6:04 pm 137/80 71 9:31 pm 140/72 68  Aug 18th  7:23 am 150/86 57 8:22 am 134/70 64 11:56 am 155/73 65 3:35 pm 134/78 67 6:43 pm130/73 69 10:15 pm 124/63 71  Aug 19th  8:40 am 117/74 62 10:33 am 113/68 67 1:59 pm 152/83 62  4:42 pm 126/72 69 7:30 pm 139/76 62   Aug 20th  8:45 am 115/79 72  12:20 pm 126/75 64 4:22 pm 115/67 67  7:33 pm 142/77 63 9:46 pm 147/77 64   Aug 21th  8:04 am 142/82 67

## 2016-01-05 NOTE — Telephone Encounter (Signed)
On average controlled - no change needed.  She can continue to monitor, but does not need to check it 5 times a day

## 2016-01-07 DIAGNOSIS — R42 Dizziness and giddiness: Secondary | ICD-10-CM | POA: Diagnosis not present

## 2016-01-14 DIAGNOSIS — R42 Dizziness and giddiness: Secondary | ICD-10-CM | POA: Diagnosis not present

## 2016-01-20 ENCOUNTER — Ambulatory Visit (AMBULATORY_SURGERY_CENTER): Payer: Medicare Other | Admitting: Gastroenterology

## 2016-01-20 ENCOUNTER — Encounter: Payer: Self-pay | Admitting: Gastroenterology

## 2016-01-20 VITALS — BP 156/71 | HR 61 | Temp 96.9°F | Resp 12 | Ht 64.0 in | Wt 154.0 lb

## 2016-01-20 DIAGNOSIS — K2270 Barrett's esophagus without dysplasia: Secondary | ICD-10-CM

## 2016-01-20 DIAGNOSIS — I739 Peripheral vascular disease, unspecified: Secondary | ICD-10-CM | POA: Diagnosis not present

## 2016-01-20 DIAGNOSIS — I4891 Unspecified atrial fibrillation: Secondary | ICD-10-CM | POA: Diagnosis not present

## 2016-01-20 DIAGNOSIS — I10 Essential (primary) hypertension: Secondary | ICD-10-CM | POA: Diagnosis not present

## 2016-01-20 MED ORDER — SODIUM CHLORIDE 0.9 % IV SOLN: 500.0000 mL | INTRAVENOUS | Status: DC

## 2016-01-20 NOTE — Op Note (Signed)
[facility] Patient Name: Margaret Foley Procedure Date: 01/20/2016 9:42 AM MRN: [id number] Endoscopist: Milus Banister , MD Age: 81 Referring MD:  Date of Birth: May 31, 1934 Gender: Female Account #: [id number] Procedure:                Upper GI endoscopy Indications:              Surveillance procedure ?Barrett's; Upper endoscopy                            Dr. Verl Blalock 2005; hiatal hernia, mild                            esophageal stricture, "3 cm of Barrett's appearing                            mucosa". Pathology showed "Barrett's esophagus" and                            also GERD. Upper endoscopyDr. Verl Blalock                            2007showed hiatal hernia, no Barrett's change                            endoscopically, no biopsies were taken. Medicines:                Monitored Anesthesia Care Procedure:                Pre-Anesthesia Assessment:                           - Prior to the procedure, a History and Physical                            was performed, and patient medications and                            allergies were reviewed. The patient's tolerance of                            previous anesthesia was also reviewed. The risks                            and benefits of the procedure and the sedation                            options and risks were discussed with the patient.                            All questions were answered, and informed consent                            was obtained. Prior Anticoagulants: The patient has  taken no previous anticoagulant or antiplatelet                            agents. ASA Grade Assessment: II - A patient with                            mild systemic disease. After reviewing the risks                            and benefits, the patient was deemed in                            satisfactory condition to undergo the procedure.                           After obtaining  informed consent, the endoscope was                            passed under direct vision. Throughout the                            procedure, the patient's blood pressure, pulse, and                            oxygen saturations were monitored continuously. The                            Model GIF-HQ190 [id number]) scope was introduced                            through the mouth, and advanced to the second part                            of duodenum. The upper GI endoscopy was                            accomplished without difficulty. The patient                            tolerated the procedure well. Scope In: Scope Out: Findings:                 A small hiatal hernia was present.                           The exam was otherwise without abnormality. Complications:            No immediate complications. Estimated blood loss:                            None. Estimated Blood Loss:     Estimated blood loss: none. Impression:               - Small hiatal hernia.                           -  The examination was otherwise normal.                           - No specimens collected. Recommendation:           - Patient has a contact number available for                            emergencies. The signs and symptoms of potential                            delayed complications were discussed with the                            patient. Return to normal activities tomorrow.                            Written discharge instructions were provided to the                            patient.                           - Resume previous diet. Chew your food well, eat                            slowly and take small bites.                           - Continue present medications.                           - No repeat upper endoscopy. Milus Banister, MD 01/20/2016 9:53:06 AM This report has been signed electronically.

## 2016-01-20 NOTE — Progress Notes (Signed)
Report to PACU, RN, vss, BBS= Clear.  

## 2016-01-20 NOTE — Patient Instructions (Signed)
Discharge instructions given. Handout on a hiatal hernia. Resume previous medications. YOU HAD AN ENDOSCOPIC PROCEDURE TODAY AT THE [facility]:   Refer to the procedure report that was given to you for any specific questions about what was found during the examination.  If the procedure report does not answer your questions, please call your gastroenterologist to clarify.  If you requested that your care partner not be given the details of your procedure findings, then the procedure report has been included in a sealed envelope for you to review at your convenience later.  YOU SHOULD EXPECT: Some feelings of bloating in the abdomen. Passage of more gas than usual.  Walking can help get rid of the air that was put into your GI tract during the procedure and reduce the bloating. If you had a lower endoscopy (such as a colonoscopy or flexible sigmoidoscopy) you may notice spotting of blood in your stool or on the toilet paper. If you underwent a bowel prep for your procedure, you may not have a normal bowel movement for a few days.  Please Note:  You might notice some irritation and congestion in your nose or some drainage.  This is from the oxygen used during your procedure.  There is no need for concern and it should clear up in a day or so.  SYMPTOMS TO REPORT IMMEDIATELY:    Following upper endoscopy (EGD)  Vomiting of blood or coffee ground material  New chest pain or pain under the shoulder blades  Painful or persistently difficult swallowing  New shortness of breath  Fever of 100F or higher  Black, tarry-looking stools  For urgent or emergent issues, a gastroenterologist can be reached at any hour by calling [phone].   DIET:  We do recommend a small meal at first, but then you may proceed to your regular diet.  Drink plenty of fluids but you should avoid alcoholic beverages for 24 hours.  ACTIVITY:  You should plan to take it easy for the rest of today and you  should NOT DRIVE or use heavy machinery until tomorrow (because of the sedation medicines used during the test).    FOLLOW UP: Our staff will call the number listed on your records the next business day following your procedure to check on you and address any questions or concerns that you may have regarding the information given to you following your procedure. If we do not reach you, we will leave a message.  However, if you are feeling well and you are not experiencing any problems, there is no need to return our call.  We will assume that you have returned to your regular daily activities without incident.  If any biopsies were taken you will be contacted by phone or by letter within the next 1-3 weeks.  Please call us at [phone] if you have not heard about the biopsies in 3 weeks.    SIGNATURES/CONFIDENTIALITY: You and/or your care partner have signed paperwork which will be entered into your electronic medical record.  These signatures attest to the fact that that the information above on your After Visit Summary has been reviewed and is understood.  Full responsibility of the confidentiality of this discharge information lies with you and/or your care-partner.  

## 2016-01-21 ENCOUNTER — Telehealth: Payer: Self-pay | Admitting: *Deleted

## 2016-01-21 DIAGNOSIS — R42 Dizziness and giddiness: Secondary | ICD-10-CM | POA: Diagnosis not present

## 2016-01-21 NOTE — Telephone Encounter (Signed)
  Follow up Call-  Call back number 01/20/2016  Post procedure Call Back phone  # [phone]  Permission to leave phone message Yes  Some recent data might be hidden     Patient questions:  Do you have a fever, pain , or abdominal swelling? No. Pain Score  0 *  Have you tolerated food without any problems? Yes.    Have you been able to return to your normal activities? Yes.    Do you have any questions about your discharge instructions: Diet   No. Medications  No. Follow up visit  No.  Do you have questions or concerns about your Care? No.  Actions: * If pain score is 4 or above: No action needed, pain <4.  Pt. Asked about the report if it was negative,read report to pt. That no specimens collected and that it stated normal,verbalize understanding.

## 2016-01-28 DIAGNOSIS — R42 Dizziness and giddiness: Secondary | ICD-10-CM | POA: Diagnosis not present

## 2016-02-11 DIAGNOSIS — R42 Dizziness and giddiness: Secondary | ICD-10-CM | POA: Diagnosis not present

## 2016-02-23 ENCOUNTER — Encounter: Payer: Self-pay | Admitting: Internal Medicine

## 2016-02-23 ENCOUNTER — Ambulatory Visit (INDEPENDENT_AMBULATORY_CARE_PROVIDER_SITE_OTHER): Payer: Medicare Other | Admitting: Internal Medicine

## 2016-02-23 ENCOUNTER — Other Ambulatory Visit (INDEPENDENT_AMBULATORY_CARE_PROVIDER_SITE_OTHER): Payer: Medicare Other

## 2016-02-23 DIAGNOSIS — N189 Chronic kidney disease, unspecified: Secondary | ICD-10-CM | POA: Diagnosis not present

## 2016-02-23 DIAGNOSIS — I10 Essential (primary) hypertension: Secondary | ICD-10-CM

## 2016-02-23 LAB — COMPREHENSIVE METABOLIC PANEL
ALBUMIN: 3.9 g/dL (ref 3.5–5.2)
ALK PHOS: 84 U/L (ref 39–117)
ALT: 12 U/L (ref 0–35)
AST: 17 U/L (ref 0–37)
BUN: 30 mg/dL — ABNORMAL HIGH (ref 6–23)
CALCIUM: 11 mg/dL — AB (ref 8.4–10.5)
CHLORIDE: 106 meq/L (ref 96–112)
CO2: 29 mEq/L (ref 19–32)
Creatinine, Ser: 1.1 mg/dL (ref 0.40–1.20)
GFR: 50.85 mL/min — AB (ref >60.00)
Glucose, Bld: 97 mg/dL (ref 70–99)
POTASSIUM: 4.2 meq/L (ref 3.5–5.1)
Sodium: 142 mEq/L (ref 135–145)
TOTAL PROTEIN: 7.2 g/dL (ref 6.0–8.3)
Total Bilirubin: 0.3 mg/dL (ref 0.2–1.2)

## 2016-02-23 MED ORDER — AMLODIPINE BESYLATE 5 MG PO TABS: 5.0000 mg | ORAL_TABLET | Freq: Two times a day (BID) | ORAL | 3 refills | Status: DC

## 2016-02-23 NOTE — Assessment & Plan Note (Addendum)
Check cmp Drink plenty of water during the day Avoid nsaids Stressed good BP control

## 2016-02-23 NOTE — Patient Instructions (Addendum)
  Test(s) ordered today. Your results will be released to [place] (or called to you) after review, usually within 72hours after test completion. If any changes need to be made, you will be notified at that same time.   Medications reviewed and updated.  Changes include increasing the amlodipine to 5 mg twice a day.    Your prescription(s) have been submitted to your pharmacy. Please take as directed and contact our office if you believe you are having problem(s) with the medication(s).   Please followup in 3 weeks

## 2016-02-23 NOTE — Progress Notes (Signed)
Subjective:    Patient ID: Margaret Foley, female    DOB: [birth year], 81 y.o.   MRN: [id number]  HPI She is here for follow up.  Hypertension: She is taking her medication daily. She is compliant with a low sodium diet.  She denies chest pain, palpitations, edema, shortness of breath and regular headaches. She is exercising regularly.  She does not monitor her blood pressure at home: 173/85, 146/86, 127/73, 153/80 - average 157/?.     Medications and allergies reviewed with patient and updated if appropriate.  Patient Active Problem List   Diagnosis Date Noted  . Dizziness 11/05/2015  . LBBB (left bundle branch block) 04/16/2015  . AAA (abdominal aortic aneurysm) without rupture ([place]) 04/08/2014  . INSOMNIA, CHRONIC 05/19/2009  . PAROXYSMAL ATRIAL FIBRILLATION 02/04/2009  . CAROTID ARTERY DISEASE 02/04/2009  . MURMUR 02/01/2009  . HIP PAIN 10/28/2008  . Essential hypertension 12/13/2007  . MYALGIA 12/13/2007  . PERIPHERAL VASCULAR DISEASE 04/10/2007  . VITAMIN B12 DEFICIENCY 01/05/2007  . VITAMIN D DEFICIENCY 01/05/2007  . Hyperlipidemia 01/05/2007  . TOBACCO USER 01/05/2007  . CORONARY ARTERY DISEASE 01/05/2007  . 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 Barrett's esophagus 01/05/2007  . Osteoarthritis 01/05/2007  . LOW BACK PAIN 01/05/2007  . OSTEOPENIA 01/05/2007    Current Outpatient Prescriptions on File Prior to Visit  Medication Sig Dispense Refill  . amLODipine (NORVASC) 2.5 MG tablet Take 2 tablets (5 mg total) by mouth daily. 90 tablet 3  . aspirin 81 MG EC tablet Take 81 mg by mouth daily.      . calcium-vitamin D (OSCAL WITH D) 500-200 MG-UNIT per tablet Take 1 tablet by mouth daily.      . cyanocobalamin 1000 MCG tablet Take 100 mcg by mouth daily.     [name] Kitchen losartan (COZAAR) 50 MG tablet Take 1 tablet (50 mg total) by mouth daily. 90 tablet 3  . Multiple Vitamins-Minerals (MULTIVITAMIN WITH MINERALS) tablet Take 1 tablet by mouth daily.    .  Omega-3 Fatty Acids (OMEGA 3 PO) Take 1 tablet by mouth daily.       Current Facility-Administered Medications on File Prior to Visit  Medication Dose Route Frequency Provider Last Rate Last Dose  . 0.9 %  sodium chloride infusion  500 mL Intravenous Continuous Milus Banister, MD        Past Medical History:  Diagnosis Date  . Anxiety   . Atrial fibrillation ([place])   . Barrett's esophagus   . CAD (coronary artery disease)   . Cancer [facility])    Pre cancerous Uterine Tumor  . Carotid artery occlusion   . Chest pain    atypical normal myovue 2008  . COPD (chronic obstructive pulmonary disease) ([place])   . Cough   . Depression   . Fibromyalgia   . GERD (gastroesophageal reflux disease)   . Heart murmur    av sclerosis and mild ar echo 2008  . Hip pain   . History of hiatal hernia   . Hyperlipidemia   . Hypertension   . Insomnia, unspecified   . Kidney stone   . Lumbago   . Myalgia and myositis, unspecified    statin intolerant  . 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 Osteoarthritis   . Osteopenia   . Other B-complex deficiencies   . Peripheral vascular disease ([place])   . Shortness of breath dyspnea   . Tobacco use disorder   .  Unspecified vitamin D deficiency     Past Surgical History:  Procedure Laterality Date  . ABDOMINAL AORTIC ANEURYSM REPAIR N/A 05/02/2015   Procedure: REPAIR ABDOMINAL AORTIC ANEURYSM  ,AORTA BI-ILIAC;  Surgeon: Serafina Mitchell, MD;  Location: [place];  Service: Vascular;  Laterality: N/A;  . ABDOMINAL HYSTERECTOMY  1986   Complete  . cataract surgery  2011  . COLONOSCOPY    . JOINT REPLACEMENT  2012   Right Shoulder and arm    Social History   Social History  . Marital status: Single    Spouse name: N/A  . Number of children: N/A  . Years of education: N/A   Occupational History  . retired    Social History Main Topics  . Smoking status: Current Some Day Smoker    Packs/day: 0.10     Years: 57.00    Types: Cigarettes  . Smokeless tobacco: Never Used     Comment: pt states she is "on again off again" not a constant smoker   . Alcohol use No  . Drug use: No  . Sexual activity: Not Asked   Other Topics Concern  . None   Social History Narrative   Regular exercise--yes daily stretching.    Family History  Problem Relation Age of Onset  . Arthritis Mother   . Heart disease Mother   . Hypertension Mother   . Heart attack Mother   . Hyperlipidemia    . Heart disease Son   . Hypertension Son   . Cancer Father   . Hyperlipidemia Sister   . Cancer Brother   . Colon cancer Neg Hx     Review of Systems  Constitutional: Negative for fever.  Respiratory: Negative for cough, shortness of breath and wheezing.   Cardiovascular: Negative for chest pain, palpitations and leg swelling.  Gastrointestinal: Negative for nausea.  Musculoskeletal: Positive for gait problem (porr balance - doing PT - getting better).  Neurological: Negative for dizziness, light-headedness and headaches.       Objective:   Vitals:   02/23/16 1619 02/23/16 1642  BP: (!) 200/80 (!) 150/72  Pulse: 75   Resp: 16   Temp: 98.1 F (36.7 C)    Filed Weights   02/23/16 1619  Weight: 161 lb (73 kg)   Body mass index is 27.64 kg/m.   Physical Exam Constitutional: Appears well-developed and well-nourished. No distress.  HENT:  Head: Normocephalic and atraumatic.  Neck: Neck supple. No tracheal deviation present. No thyromegaly present.  No cervical lymphadenopathy Cardiovascular: Normal rate, regular rhythm and normal heart sounds.   2/6 systolic murmur heard. No carotid bruit .  No edema Pulmonary/Chest: Effort normal and breath sounds normal. No respiratory distress. No has no wheezes. No rales.  Skin: Skin is warm and dry. Not diaphoretic.  Psychiatric: Normal mood and affect. Behavior is normal.       Assessment & Plan:   See Problem List for Assessment and Plan of chronic  medical problems.

## 2016-02-23 NOTE — Assessment & Plan Note (Addendum)
Uncontrolled Increase amlodipine to 5 mg twice daily Continue losartan 50 mg daily cmp today Continue to monitor at home Follow up in about 3 weeks

## 2016-02-23 NOTE — Progress Notes (Signed)
Pre visit review using our clinic review tool, if applicable. No additional management support is needed unless otherwise documented below in the visit note. 

## 2016-03-17 ENCOUNTER — Ambulatory Visit (INDEPENDENT_AMBULATORY_CARE_PROVIDER_SITE_OTHER): Payer: Medicare Other | Admitting: Internal Medicine

## 2016-03-17 ENCOUNTER — Encounter: Payer: Self-pay | Admitting: Internal Medicine

## 2016-03-17 VITALS — BP 166/84 | HR 64 | Temp 98.2°F | Resp 16 | Ht 64.0 in | Wt 162.0 lb

## 2016-03-17 DIAGNOSIS — I10 Essential (primary) hypertension: Secondary | ICD-10-CM

## 2016-03-17 MED ORDER — LOSARTAN POTASSIUM 50 MG PO TABS: 50.0000 mg | ORAL_TABLET | Freq: Two times a day (BID) | ORAL | 3 refills | Status: DC

## 2016-03-17 NOTE — Progress Notes (Signed)
Pre visit review using our clinic review tool, if applicable. No additional management support is needed unless otherwise documented below in the visit note. 

## 2016-03-17 NOTE — Progress Notes (Signed)
Subjective:    Patient ID: Margaret Foley, female    DOB: 09/28/1934, 81 y.o.   MRN: [id number]  HPI She is here for follow up.  Hypertension: She is taking her medication daily. We increased her amlodipine at her last visit.  She is compliant with a low sodium diet.  She denies chest pain, palpitations, edema, shortness of breath and regular headaches. She is exercising regularly.  She does monitor her blood pressure at home -- 116/71, 123/59, 111/65, 163/80, 156/76, 116/66, 141/70, 159/83, 144/69, 164/80, 145/80, 126/69, 152/75, 136/77    She is still smoking cigarettes, but smokes only 3 a day. She knows she should quit completely.  Medications and allergies reviewed with patient and updated if appropriate.  Patient Active Problem List   Diagnosis Date Noted  . CKD (chronic kidney disease) 02/23/2016  . Dizziness 11/05/2015  . LBBB (left bundle branch block) 04/16/2015  . AAA (abdominal aortic aneurysm) without rupture ([place]) 04/08/2014  . INSOMNIA, CHRONIC 05/19/2009  . PAROXYSMAL ATRIAL FIBRILLATION 02/04/2009  . CAROTID ARTERY DISEASE 02/04/2009  . MURMUR 02/01/2009  . HIP PAIN 10/28/2008  . Essential hypertension 12/13/2007  . MYALGIA 12/13/2007  . PERIPHERAL VASCULAR DISEASE 04/10/2007  . VITAMIN B12 DEFICIENCY 01/05/2007  . VITAMIN D DEFICIENCY 01/05/2007  . Hyperlipidemia 01/05/2007  . TOBACCO USER 01/05/2007  . CORONARY ARTERY DISEASE 01/05/2007  . 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 Barrett's esophagus 01/05/2007  . Osteoarthritis 01/05/2007  . LOW BACK PAIN 01/05/2007  . OSTEOPENIA 01/05/2007    Current Outpatient Prescriptions on File Prior to Visit  Medication Sig Dispense Refill  . amLODipine (NORVASC) 5 MG tablet Take 1 tablet (5 mg total) by mouth 2 (two) times daily. 180 tablet 3  . aspirin 81 MG EC tablet Take 81 mg by mouth daily.      . calcium-vitamin D (OSCAL WITH D) 500-200 MG-UNIT per tablet Take 1 tablet by mouth daily.       . cyanocobalamin 1000 MCG tablet Take 100 mcg by mouth daily.     [name] Kitchen losartan (COZAAR) 50 MG tablet Take 1 tablet (50 mg total) by mouth daily. 90 tablet 3  . Multiple Vitamins-Minerals (MULTIVITAMIN WITH MINERALS) tablet Take 1 tablet by mouth daily.    . Omega-3 Fatty Acids (OMEGA 3 PO) Take 1 tablet by mouth daily.       No current facility-administered medications on file prior to visit.     Past Medical History:  Diagnosis Date  . Anxiety   . Atrial fibrillation ([place])   . Barrett's esophagus   . CAD (coronary artery disease)   . Cancer [facility])    Pre cancerous Uterine Tumor  . Carotid artery occlusion   . Chest pain    atypical normal myovue 2008  . COPD (chronic obstructive pulmonary disease) ([place])   . Cough   . Depression   . Fibromyalgia   . GERD (gastroesophageal reflux disease)   . Heart murmur    av sclerosis and mild ar echo 2008  . Hip pain   . History of hiatal hernia   . Hyperlipidemia   . Hypertension   . Insomnia, unspecified   . Kidney stone   . Lumbago   . Myalgia and myositis, unspecified    statin intolerant  . 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 Osteoarthritis   . Osteopenia   . Other B-complex deficiencies   . Peripheral vascular disease ([place])   .  Shortness of breath dyspnea   . Tobacco use disorder   . Unspecified vitamin D deficiency     Past Surgical History:  Procedure Laterality Date  . ABDOMINAL AORTIC ANEURYSM REPAIR N/A 05/02/2015   Procedure: REPAIR ABDOMINAL AORTIC ANEURYSM  ,AORTA BI-ILIAC;  Surgeon: Serafina Mitchell, MD;  Location: [place];  Service: Vascular;  Laterality: N/A;  . ABDOMINAL HYSTERECTOMY  1986   Complete  . cataract surgery  2011  . COLONOSCOPY    . JOINT REPLACEMENT  2012   Right Shoulder and arm    Social History   Social History  . Marital status: Single    Spouse name: N/A  . Number of children: N/A  . Years of education: N/A   Occupational History  .  retired    Social History Main Topics  . Smoking status: Current Some Day Smoker    Packs/day: 0.10    Years: 57.00    Types: Cigarettes  . Smokeless tobacco: Never Used     Comment: pt states she is "on again off again" not a constant smoker   . Alcohol use No  . Drug use: No  . Sexual activity: Not on file   Other Topics Concern  . Not on file   Social History Narrative   Regular exercise--yes daily stretching.    Family History  Problem Relation Age of Onset  . Arthritis Mother   . Heart disease Mother   . Hypertension Mother   . Heart attack Mother   . Hyperlipidemia    . Heart disease Son   . Hypertension Son   . Cancer Father   . Hyperlipidemia Sister   . Cancer Brother   . Colon cancer Neg Hx     Review of Systems  Constitutional: Negative for fever.  HENT: Positive for tinnitus.   Respiratory: Negative for cough, shortness of breath and wheezing.   Cardiovascular: Negative for chest pain, palpitations and leg swelling.  Neurological: Positive for dizziness (occ with quick movements or bending down) and light-headedness (occ). Negative for headaches.       Objective:   Vitals:   03/17/16 1333  BP: (!) 166/84  Pulse: 64  Resp: 16  Temp: 98.2 F (36.8 C)   Filed Weights   03/17/16 1333  Weight: 162 lb (73.5 kg)   Body mass index is 27.81 kg/m.   Physical Exam Constitutional: Appears well-developed and well-nourished. No distress.  HENT:  Head: Normocephalic and atraumatic.  Neck: Neck supple. No tracheal deviation present. No thyromegaly present.  No cervical lymphadenopathy Cardiovascular: Normal rate, regular rhythm and normal heart sounds.   No murmur heard. No carotid bruit .  No edema Pulmonary/Chest: Effort normal and breath sounds normal. No respiratory distress. No has no wheezes. No rales.  Skin: Skin is warm and dry. Not diaphoretic.  Psychiatric: Normal mood and affect. Behavior is normal.       Assessment & Plan:   See  Problem List for Assessment and Plan of chronic medical problems.  F/u in 3 months

## 2016-03-17 NOTE — Patient Instructions (Addendum)
   All other Health Maintenance issues reviewed.   All recommended immunizations and age-appropriate screenings are up-to-date or discussed.  Prevnar vaccine administered today.   Medications reviewed and updated.  Changes include increasing losartan to 50 mg twice daily.   Call with any side effects.  Please followup in 3 months

## 2016-03-17 NOTE — Assessment & Plan Note (Signed)
Blood pressure not ideally controlled Stressed smoking cessation We'll increase losartan to 50 mg twice daily-she will do this for a couple of days and let me know she tolerates this and if her blood pressure improves Continue amlodipine 5 mg twice daily Will check blood work when she returns in 3 months for follow-up

## 2016-03-24 ENCOUNTER — Telehealth: Payer: Self-pay | Admitting: *Deleted

## 2016-03-24 MED ORDER — LOSARTAN POTASSIUM 50 MG PO TABS: 50.0000 mg | ORAL_TABLET | Freq: Two times a day (BID) | ORAL | 1 refills | Status: DC

## 2016-03-24 NOTE — Telephone Encounter (Signed)
Pt left msg on triage stating MD increase her Losartan to 100 mg needing to get updated script sent to [place]..Notified pt refill has been sent..../LMB

## 2016-05-22 IMAGING — CR DG CHEST 1V PORT
1 series · 1 of 1 positions shown · non-contrast
Comparison: August 03, 2010.

CLINICAL DATA: Status post aortic aneurysm repair.

EXAM:
PORTABLE CHEST 1 VIEW

[AP]
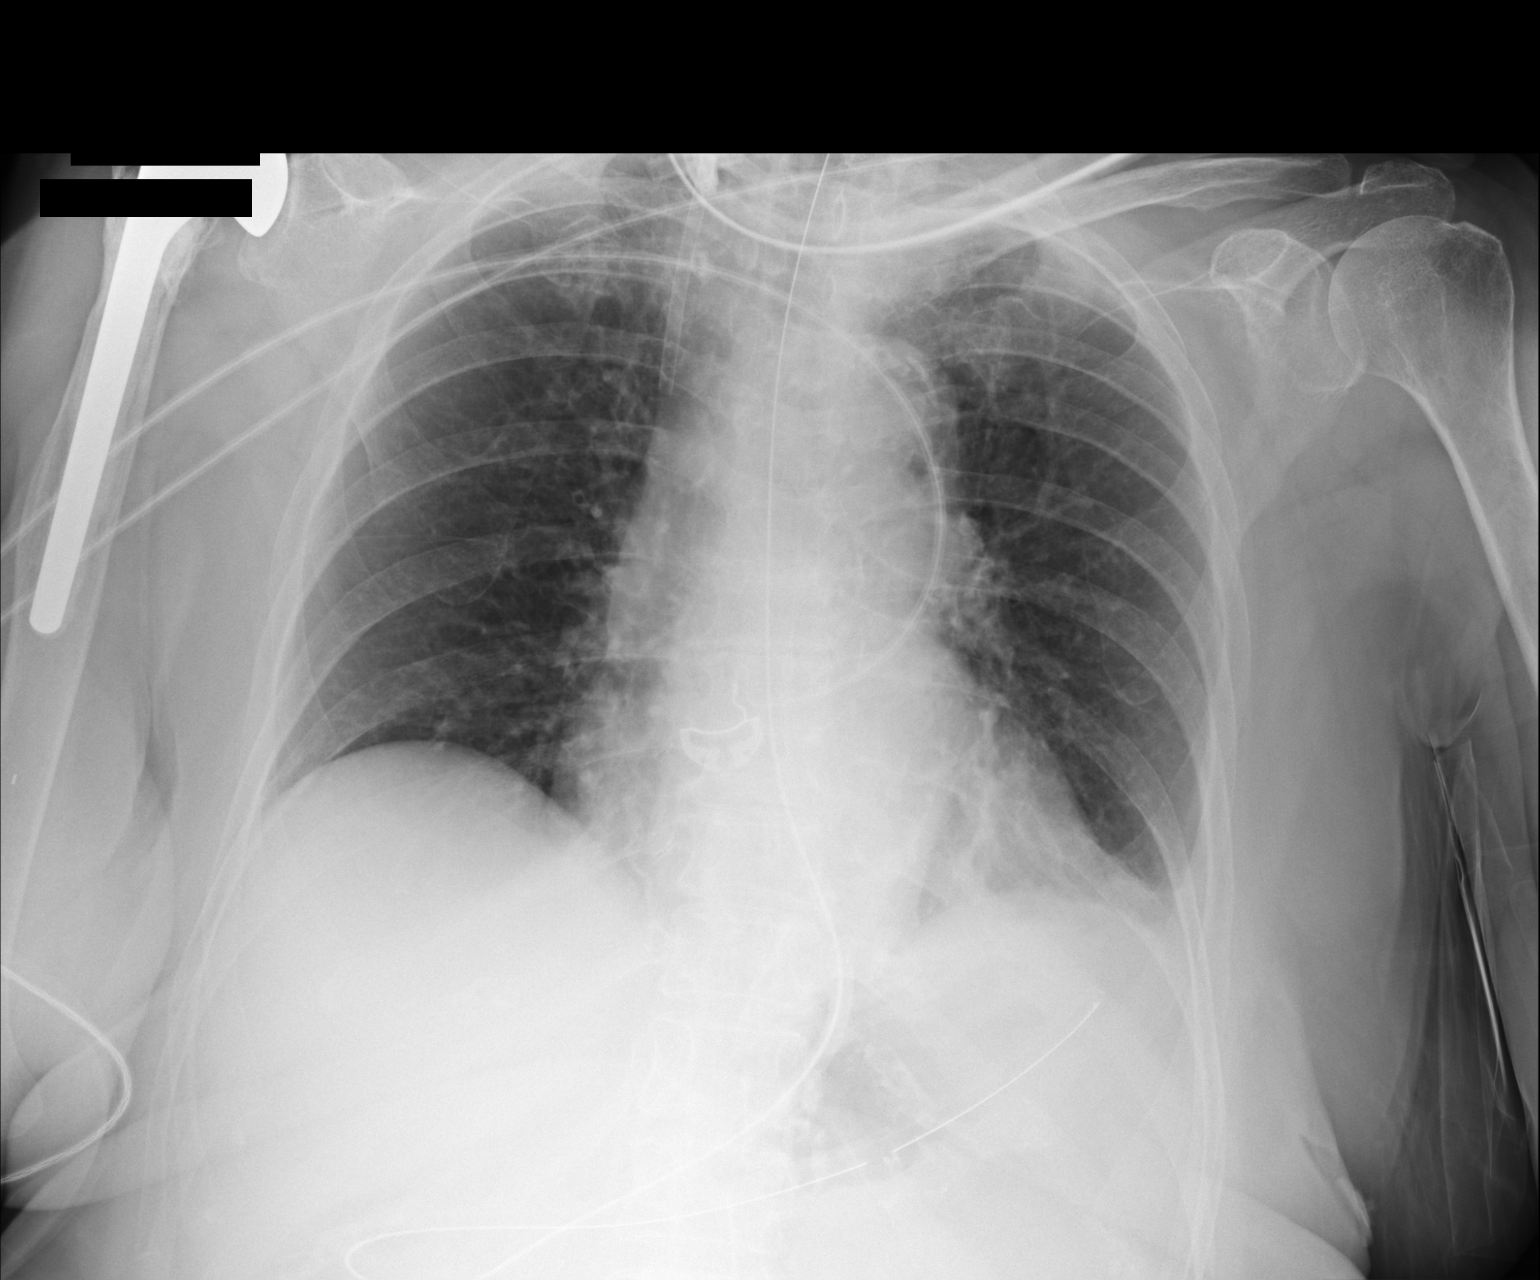

[1 of 1 positions shown; findings below may reference images not displayed]

FINDINGS: The heart size and mediastinal contours are within normal limits. No
pneumothorax or pleural effusion is noted. Nasogastric tube is seen
entering stomach. Status post right shoulder arthroplasty. Right
lung is clear. Mild left basilar subsegmental atelectasis is noted.
IMPRESSION: Mild left basilar subsegmental atelectasis.

## 2016-05-22 IMAGING — CR DG ABD PORTABLE 1V
1 series · 1 of 1 positions shown · non-contrast
Comparison: None.

CLINICAL DATA: Status post aortic aneurysm repair.

EXAM:
PORTABLE ABDOMEN - 1 VIEW

[AP]
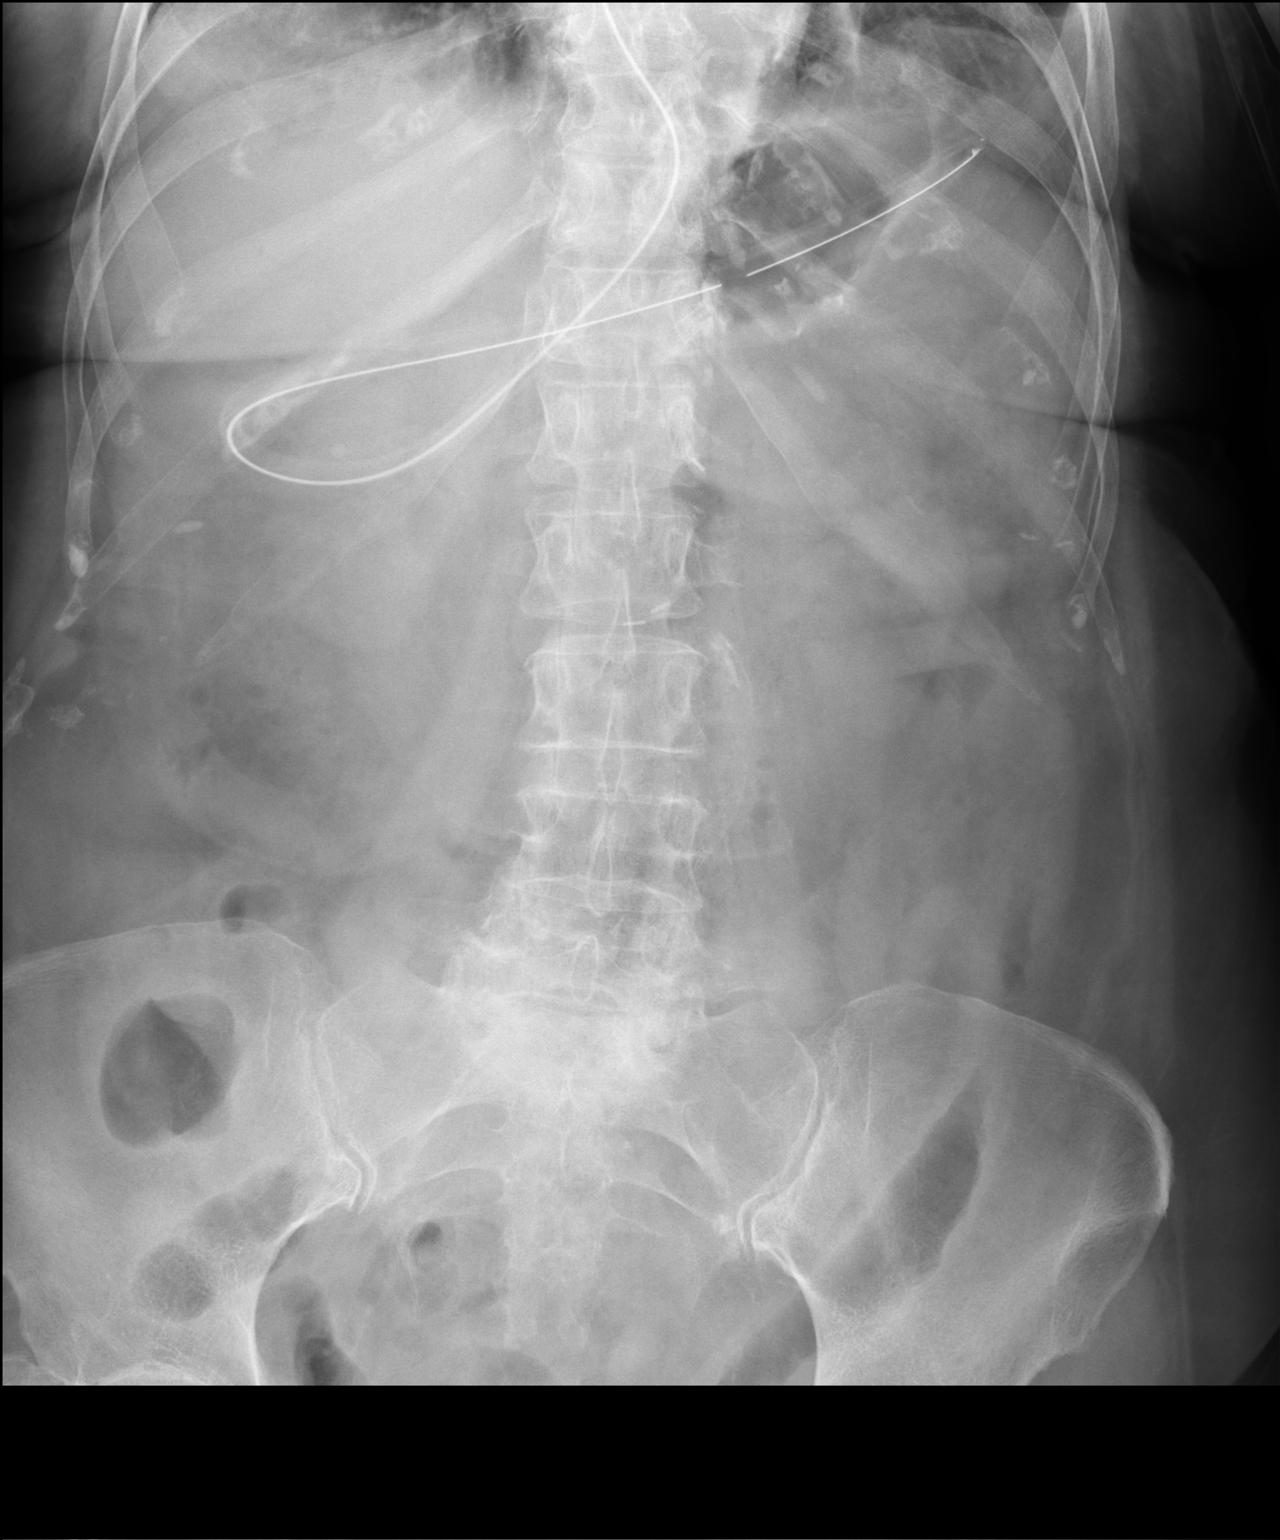

[1 of 1 positions shown; findings below may reference images not displayed]

FINDINGS: The bowel gas pattern is normal. Nasogastric tube is seen looped in
stomach with tip in proximal stomach.
IMPRESSION: No evidence of bowel obstruction or ileus.

## 2016-05-23 IMAGING — CR DG CHEST 1V PORT
1 series · 1 of 1 positions shown · non-contrast
Comparison: Chest radiograph 05/02/2015.

CLINICAL DATA: Patient status post AAA repair.

EXAM:
PORTABLE CHEST 1 VIEW

[AP]
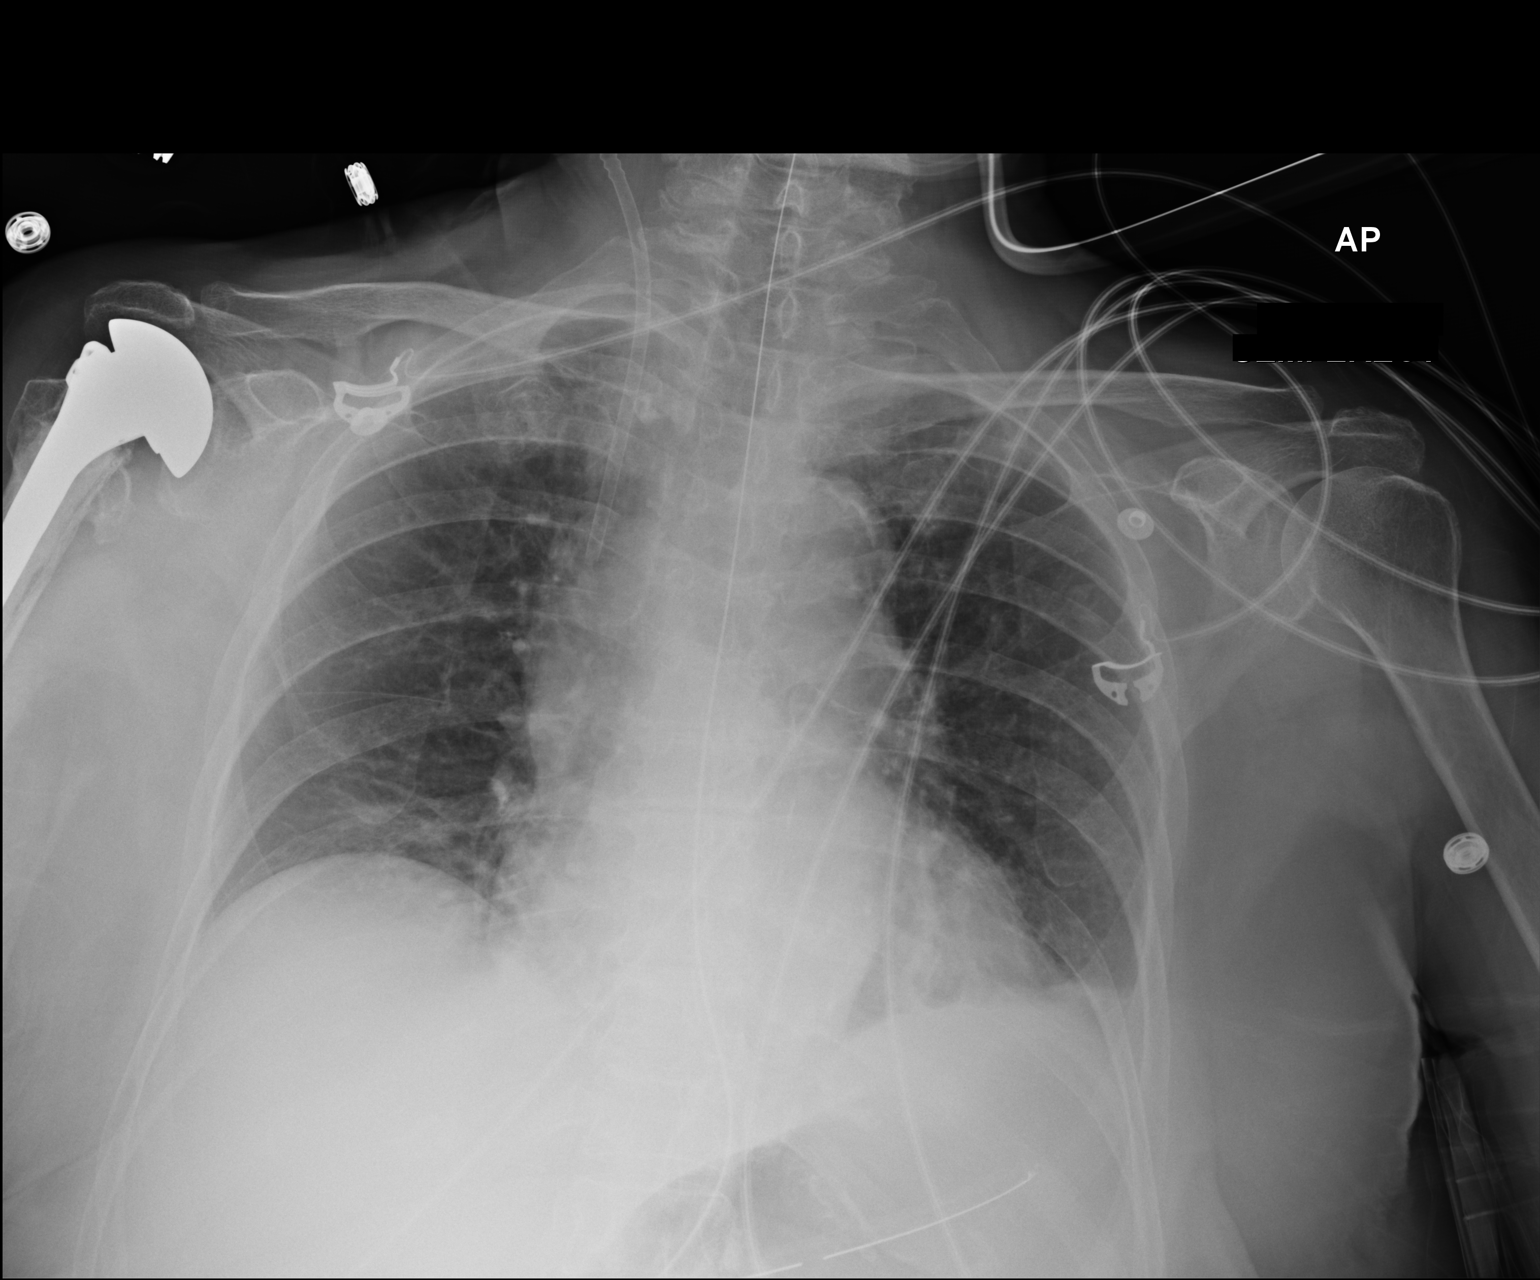

[1 of 1 positions shown; findings below may reference images not displayed]

FINDINGS: Enteric tube tip and side-port course inferior to the diaphragm.
Right IJ central venous catheter sheath projects over the superior
vena cava, unchanged. Stable cardiac and mediastinal contours. Low
lung volumes with bibasilar heterogeneous opacities. No large
pleural effusion or pneumothorax. Postsurgical changes proximal
right humerus.
IMPRESSION: Low lung volumes with basilar opacities favored to represent
atelectasis.

Stable support apparatus.

## 2016-06-18 ENCOUNTER — Encounter: Payer: Self-pay | Admitting: Internal Medicine

## 2016-06-18 ENCOUNTER — Other Ambulatory Visit (INDEPENDENT_AMBULATORY_CARE_PROVIDER_SITE_OTHER): Payer: Medicare Other

## 2016-06-18 ENCOUNTER — Ambulatory Visit (INDEPENDENT_AMBULATORY_CARE_PROVIDER_SITE_OTHER): Payer: Medicare Other | Admitting: Internal Medicine

## 2016-06-18 VITALS — BP 162/82 | HR 72 | Temp 98.5°F | Resp 16 | Wt 172.0 lb

## 2016-06-18 DIAGNOSIS — I10 Essential (primary) hypertension: Secondary | ICD-10-CM

## 2016-06-18 DIAGNOSIS — N189 Chronic kidney disease, unspecified: Secondary | ICD-10-CM

## 2016-06-18 DIAGNOSIS — Z23 Encounter for immunization: Secondary | ICD-10-CM

## 2016-06-18 LAB — COMPREHENSIVE METABOLIC PANEL
ALBUMIN: 4.3 g/dL (ref 3.5–5.2)
ALT: 8 U/L (ref 0–35)
AST: 15 U/L (ref 0–37)
Alkaline Phosphatase: 78 U/L (ref 39–117)
BILIRUBIN TOTAL: 0.3 mg/dL (ref 0.2–1.2)
BUN: 36 mg/dL — ABNORMAL HIGH (ref 6–23)
CALCIUM: 9.8 mg/dL (ref 8.4–10.5)
CO2: 27 mEq/L (ref 19–32)
Chloride: 107 mEq/L (ref 96–112)
Creatinine, Ser: 1.59 mg/dL — ABNORMAL HIGH (ref 0.40–1.20)
GFR: 33.21 mL/min — AB (ref >60.00)
Glucose, Bld: 101 mg/dL — ABNORMAL HIGH (ref 70–99)
Potassium: 4.1 mEq/L (ref 3.5–5.1)
Sodium: 140 mEq/L (ref 135–145)
Total Protein: 7.7 g/dL (ref 6.0–8.3)

## 2016-06-18 MED ORDER — ATENOLOL 25 MG PO TABS: 12.5000 mg | ORAL_TABLET | Freq: Every day | ORAL | 1 refills | Status: DC

## 2016-06-18 NOTE — Patient Instructions (Addendum)
  Test(s) ordered today. Your results will be released to [place] (or called to you) after review, usually within 72hours after test completion. If any changes need to be made, you will be notified at that same time.  All other Health Maintenance issues reviewed.   All recommended immunizations and age-appropriate screenings are up-to-date or discussed.  flu immunization administered today.   Medications reviewed and updated.  Changes include starting atenolol at a low dose of 12.5 mg daily.  Your prescription(s) have been submitted to your pharmacy. Please take as directed and contact our office if you believe you are having problem(s) with the medication(s).   Please followup in 3 months

## 2016-06-18 NOTE — Assessment & Plan Note (Signed)
Difficulty sleeping due to back pain and frequent urination

## 2016-06-18 NOTE — Assessment & Plan Note (Signed)
cmp today Stressed good BP control

## 2016-06-18 NOTE — Assessment & Plan Note (Signed)
BP not controlled Continue losartan and amlodipine at current doses Start atenolol 12.5 mg daily Call with any side effects cmp

## 2016-06-18 NOTE — Progress Notes (Signed)
Pre visit review using our clinic review tool, if applicable. No additional management support is needed unless otherwise documented below in the visit note. 

## 2016-06-18 NOTE — Progress Notes (Signed)
Subjective:    Patient ID: Margaret Foley, female    DOB: Jan 24, 1935, 82 y.o.   MRN: [id number]  HPI The patient is here for follow up.  Hypertension: She is taking her medication daily. She is compliant with a low sodium diet.  She denies chest pain, palpitations, edema, shortness of breath and regular headaches. She is not exercising regularly.  She does monitor her blood pressure at home: 159/87, 141/83, 146/83, 154/81, 140/76, 116/64, 134/70, 151/75.     CKD: She is drinking a lot of fluids.  She denies changes in urination.   Intermittent loss of taste:  It started after after surgery and was thought to be related to anesthesia.   Her right ear cartilage seems to get inflamed intermittently.  It comes and goes.  She does lay on her right side at night, but it is not always in the morning that she notices this.   Working on quitting smoking.   Medications and allergies reviewed with patient and updated if appropriate.  Patient Active Problem List   Diagnosis Date Noted  . CKD (chronic kidney disease) 02/23/2016  . Dizziness 11/05/2015  . LBBB (left bundle branch block) 04/16/2015  . AAA (abdominal aortic aneurysm) without rupture ([place]) 04/08/2014  . INSOMNIA, CHRONIC 05/19/2009  . PAROXYSMAL ATRIAL FIBRILLATION 02/04/2009  . CAROTID ARTERY DISEASE 02/04/2009  . MURMUR 02/01/2009  . HIP PAIN 10/28/2008  . Essential hypertension 12/13/2007  . MYALGIA 12/13/2007  . PERIPHERAL VASCULAR DISEASE 04/10/2007  . VITAMIN B12 DEFICIENCY 01/05/2007  . VITAMIN D DEFICIENCY 01/05/2007  . Hyperlipidemia 01/05/2007  . TOBACCO USER 01/05/2007  . CORONARY ARTERY DISEASE 01/05/2007  . 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 Barrett's esophagus 01/05/2007  . Osteoarthritis 01/05/2007  . LOW BACK PAIN 01/05/2007  . OSTEOPENIA 01/05/2007    Current Outpatient Prescriptions on File Prior to Visit  Medication Sig Dispense Refill  . amLODipine (NORVASC) 5 MG tablet  Take 1 tablet (5 mg total) by mouth 2 (two) times daily. 180 tablet 3  . aspirin 81 MG EC tablet Take 81 mg by mouth daily.      . calcium-vitamin D (OSCAL WITH D) 500-200 MG-UNIT per tablet Take 1 tablet by mouth daily.      . cyanocobalamin 1000 MCG tablet Take 100 mcg by mouth daily.     [name] Kitchen losartan (COZAAR) 50 MG tablet Take 1 tablet (50 mg total) by mouth 2 (two) times daily. 180 tablet 1  . Multiple Vitamins-Minerals (MULTIVITAMIN WITH MINERALS) tablet Take 1 tablet by mouth daily.    . Omega-3 Fatty Acids (OMEGA 3 PO) Take 1 tablet by mouth daily.       No current facility-administered medications on file prior to visit.     Past Medical History:  Diagnosis Date  . Anxiety   . Atrial fibrillation ([place])   . Barrett's esophagus   . CAD (coronary artery disease)   . Cancer [facility])    Pre cancerous Uterine Tumor  . Carotid artery occlusion   . Chest pain    atypical normal myovue 2008  . COPD (chronic obstructive pulmonary disease) ([place])   . Cough   . Depression   . Fibromyalgia   . GERD (gastroesophageal reflux disease)   . Heart murmur    av sclerosis and mild ar echo 2008  . Hip pain   . History of hiatal hernia   . Hyperlipidemia   . Hypertension   . Insomnia, unspecified   . Kidney  stone   . Lumbago   . Myalgia and myositis, unspecified    statin intolerant  . 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 Osteoarthritis   . Osteopenia   . Other B-complex deficiencies   . Peripheral vascular disease ([place])   . Shortness of breath dyspnea   . Tobacco use disorder   . Unspecified vitamin D deficiency     Past Surgical History:  Procedure Laterality Date  . ABDOMINAL AORTIC ANEURYSM REPAIR N/A 05/02/2015   Procedure: REPAIR ABDOMINAL AORTIC ANEURYSM  ,AORTA BI-ILIAC;  Surgeon: Serafina Mitchell, MD;  Location: [place];  Service: Vascular;  Laterality: N/A;  . ABDOMINAL HYSTERECTOMY  1986   Complete  . cataract surgery  2011  .  COLONOSCOPY    . JOINT REPLACEMENT  2012   Right Shoulder and arm    Social History   Social History  . Marital status: Single    Spouse name: N/A  . Number of children: N/A  . Years of education: N/A   Occupational History  . retired    Social History Main Topics  . Smoking status: Current Some Day Smoker    Packs/day: 0.10    Years: 57.00    Types: Cigarettes  . Smokeless tobacco: Never Used     Comment: pt states she is "on again off again" not a constant smoker   . Alcohol use No  . Drug use: No  . Sexual activity: Not on file   Other Topics Concern  . Not on file   Social History Narrative   Regular exercise--yes daily stretching.    Family History  Problem Relation Age of Onset  . Arthritis Mother   . Heart disease Mother   . Hypertension Mother   . Heart attack Mother   . Hyperlipidemia    . Heart disease Son   . Hypertension Son   . Cancer Father   . Hyperlipidemia Sister   . Cancer Brother   . Colon cancer Neg Hx     Review of Systems  Constitutional: Negative for fever.  HENT: Positive for rhinorrhea. Negative for sinus pain and sinus pressure.   Respiratory: Negative for cough, shortness of breath and wheezing.   Cardiovascular: Negative for chest pain, palpitations and leg swelling.  Musculoskeletal: Positive for back pain.  Neurological: Positive for light-headedness (with position changes). Negative for headaches.  Psychiatric/Behavioral: Positive for sleep disturbance (related to back pain).       Objective:   Vitals:   06/18/16 1355  BP: (!) 162/82  Pulse: 72  Resp: 16  Temp: 98.5 F (36.9 C)   Wt Readings from Last 3 Encounters:  06/18/16 172 lb (78 kg)  03/17/16 162 lb (73.5 kg)  02/23/16 161 lb (73 kg)   Body mass index is 29.52 kg/m.   Physical Exam    Constitutional: Appears well-developed and well-nourished. No distress.  HENT:  Head: Normocephalic and atraumatic.  Neck: Neck supple. No tracheal deviation  present. No thyromegaly present.  No cervical lymphadenopathy Cardiovascular: Normal rate, regular rhythm and normal heart sounds.   No murmur heard. No carotid bruit .  No edema Pulmonary/Chest: Effort normal and breath sounds normal. No respiratory distress. No has no wheezes. No rales.  Skin: Skin is warm and dry. Not diaphoretic.  Psychiatric: Normal mood and affect. Behavior is normal.      Assessment & Plan:    See Problem List for Assessment and Plan of chronic medical  problems.

## 2016-09-15 ENCOUNTER — Other Ambulatory Visit: Payer: Self-pay | Admitting: Internal Medicine

## 2016-11-01 ENCOUNTER — Encounter (HOSPITAL_COMMUNITY): Payer: Medicare Other

## 2016-11-01 ENCOUNTER — Other Ambulatory Visit (HOSPITAL_COMMUNITY): Payer: Medicare Other

## 2016-11-01 ENCOUNTER — Ambulatory Visit: Payer: Medicare Other | Admitting: Family

## 2016-11-10 ENCOUNTER — Encounter: Payer: Self-pay | Admitting: Cardiology

## 2016-11-19 ENCOUNTER — Encounter: Payer: Self-pay | Admitting: Family

## 2016-11-30 ENCOUNTER — Encounter: Payer: Self-pay | Admitting: Family

## 2016-11-30 ENCOUNTER — Other Ambulatory Visit: Payer: Self-pay | Admitting: Surgery

## 2016-11-30 ENCOUNTER — Ambulatory Visit (INDEPENDENT_AMBULATORY_CARE_PROVIDER_SITE_OTHER): Payer: Medicare Other | Admitting: Family

## 2016-11-30 ENCOUNTER — Ambulatory Visit (INDEPENDENT_AMBULATORY_CARE_PROVIDER_SITE_OTHER)
Admission: RE | Admit: 2016-11-30 | Discharge: 2016-11-30 | Disposition: A | Discharge status: Home / Self Care | Payer: Medicare Other | Source: Ambulatory Visit | Attending: Surgery | Admitting: Surgery

## 2016-11-30 ENCOUNTER — Ambulatory Visit (HOSPITAL_COMMUNITY)
Admission: RE | Admit: 2016-11-30 | Discharge: 2016-11-30 | Disposition: A | Discharge status: Home / Self Care | Payer: Medicare Other | Source: Ambulatory Visit | Attending: Family | Admitting: Family

## 2016-11-30 VITALS — BP 155/76 | HR 61 | Temp 96.9°F | Resp 16 | Ht 64.0 in | Wt 175.0 lb

## 2016-11-30 DIAGNOSIS — I779 Disorder of arteries and arterioles, unspecified: Secondary | ICD-10-CM | POA: Diagnosis not present

## 2016-11-30 DIAGNOSIS — I714 Abdominal aortic aneurysm, without rupture, unspecified: Secondary | ICD-10-CM

## 2016-11-30 DIAGNOSIS — Z8679 Personal history of other diseases of the circulatory system: Secondary | ICD-10-CM | POA: Diagnosis not present

## 2016-11-30 DIAGNOSIS — F17200 Nicotine dependence, unspecified, uncomplicated: Secondary | ICD-10-CM

## 2016-11-30 DIAGNOSIS — Z9889 Other specified postprocedural states: Secondary | ICD-10-CM | POA: Diagnosis not present

## 2016-11-30 NOTE — Patient Instructions (Signed)
Peripheral Vascular Disease Peripheral vascular disease (PVD) is a disease of the blood vessels that are not part of your heart and brain. A simple term for PVD is poor circulation. In most cases, PVD narrows the blood vessels that carry blood from your heart to the rest of your body. This can result in a decreased supply of blood to your arms, legs, and internal organs, like your stomach or kidneys. However, it most often affects a person's lower legs and feet. There are two types of PVD.  Organic PVD. This is the more common type. It is caused by damage to the structure of blood vessels.  Functional PVD. This is caused by conditions that make blood vessels contract and tighten (spasm).  Without treatment, PVD tends to get worse over time. PVD can also lead to acute ischemic limb. This is when an arm or limb suddenly has trouble getting enough blood. This is a medical emergency. Follow these instructions at home:  Take medicines only as told by your doctor.  Do not use any tobacco products, including cigarettes, chewing tobacco, or electronic cigarettes. If you need help quitting, ask your doctor.  Lose weight if you are overweight, and maintain a healthy weight as told by your doctor.  Eat a diet that is low in fat and cholesterol. If you need help, ask your doctor.  Exercise regularly. Ask your doctor for some good activities for you.  Take good care of your feet. ? Wear comfortable shoes that fit well. ? Check your feet often for any cuts or sores. Contact a doctor if:  You have cramps in your legs while walking.  You have leg pain when you are at rest.  You have coldness in a leg or foot.  Your skin changes.  You are unable to get or have an erection (erectile dysfunction).  You have cuts or sores on your feet that are not healing. Get help right away if:  Your arm or leg turns cold and blue.  Your arms or legs become red, warm, swollen, painful, or numb.  You have  chest pain or trouble breathing.  You suddenly have weakness in your face, arm, or leg.  You become very confused or you cannot speak.  You suddenly have a very bad headache.  You suddenly cannot see. This information is not intended to replace advice given to you by your health care provider. Make sure you discuss any questions you have with your health care provider. Document Released: 07/28/2009 Document Revised: 10/09/2015 Document Reviewed: 10/11/2013 Elsevier Interactive Patient Education  2017 Elsevier Inc.      Steps to Quit Smoking Smoking tobacco can be bad for your health. It can also affect almost every organ in your body. Smoking puts you and people around you at risk for many serious long-lasting (chronic) diseases. Quitting smoking is hard, but it is one of the best things that you can do for your health. It is never too late to quit. What are the benefits of quitting smoking? When you quit smoking, you lower your risk for getting serious diseases and conditions. They can include:  Lung cancer or lung disease.  Heart disease.  Stroke.  Heart attack.  Not being able to have children (infertility).  Weak bones (osteoporosis) and broken bones (fractures).  If you have coughing, wheezing, and shortness of breath, those symptoms may get better when you quit. You may also get sick less often. If you are pregnant, quitting smoking can help to lower   your chances of having a baby of low birth weight. What can I do to help me quit smoking? Talk with your doctor about what can help you quit smoking. Some things you can do (strategies) include:  Quitting smoking totally, instead of slowly cutting back how much you smoke over a period of time.  Going to in-person counseling. You are more likely to quit if you go to many counseling sessions.  Using resources and support systems, such as: ? Online chats with a counselor. ? Phone quitlines. ? Printed self-help  materials. ? Support groups or group counseling. ? Text messaging programs. ? Mobile phone apps or applications.  Taking medicines. Some of these medicines may have nicotine in them. If you are pregnant or breastfeeding, do not take any medicines to quit smoking unless your doctor says it is okay. Talk with your doctor about counseling or other things that can help you.  Talk with your doctor about using more than one strategy at the same time, such as taking medicines while you are also going to in-person counseling. This can help make quitting easier. What things can I do to make it easier to quit? Quitting smoking might feel very hard at first, but there is a lot that you can do to make it easier. Take these steps:  Talk to your family and friends. Ask them to support and encourage you.  Call phone quitlines, reach out to support groups, or work with a counselor.  Ask people who smoke to not smoke around you.  Avoid places that make you want (trigger) to smoke, such as: ? Bars. ? Parties. ? Smoke-break areas at work.  Spend time with people who do not smoke.  Lower the stress in your life. Stress can make you want to smoke. Try these things to help your stress: ? Getting regular exercise. ? Deep-breathing exercises. ? Yoga. ? Meditating. ? Doing a body scan. To do this, close your eyes, focus on one area of your body at a time from head to toe, and notice which parts of your body are tense. Try to relax the muscles in those areas.  Download or buy apps on your mobile phone or tablet that can help you stick to your quit plan. There are many free apps, such as QuitGuide from the CDC (Centers for Disease Control and Prevention). You can find more support from smokefree.gov and other websites.  This information is not intended to replace advice given to you by your health care provider. Make sure you discuss any questions you have with your health care provider. Document Released:  02/27/2009 Document Revised: 12/30/2015 Document Reviewed: 09/17/2014 Elsevier Interactive Patient Education  2018 Elsevier Inc.  

## 2016-11-30 NOTE — Progress Notes (Signed)
[facility] [place] HISTORY AND PHYSICAL   MRN : [id number]  History of Present Illness:   Margaret Foley is a 82 y.o. female returns for follow-up. She is status post open repair of an abdominal aortic aneurysm using an 18 x 9 bifurcated dacryon graft with reimplantation of the inferior mesenteric artery into the left limb of the graft on 05-02-15 by Dr. Trula Slade. Aortic diameter was 5.1 cm. Her postoperative course has been relatively uncomplicated.  Dr. Trula Slade last evaluated pt on 10-27-15. At that time she continued to recover nicely. Dr. Trula Slade advised pt to follow-up in one year with ABIs.  Dr. Trula Slade advised an ultrasound of her abdomen to evaluate for aneurysmal degeneration of the proximal and distal anastomosis in 3 years.  She reports an occasional aching in her anterior thighs and calves when getting up from a chair, eases off with walking.  She reports lots or arthritis in her back, especially low back, states she has had injections in her back for this.  She lives alone and does her own housework.   She denies any known history of stroke or TIA.   She took therapy sessions for her balance which has improved.   Pt Diabetic: No Pt smoker: smoker  (1-2 packs/week, started at 20 yrs, quit for a long time)  Pt meds include: Statin :No, had myalgia reaction to one statin, "buzzing" reaction to another Betablocker: Yes ASA: Yes Other anticoagulants/antiplatelets: no  Current Outpatient Prescriptions  Medication Sig Dispense Refill  . amLODipine (NORVASC) 5 MG tablet Take 1 tablet (5 mg total) by mouth 2 (two) times daily. 180 tablet 3  . aspirin 81 MG EC tablet Take 81 mg by mouth daily.      [name] Kitchen atenolol (TENORMIN) 25 MG tablet Take 0.5 tablets (12.5 mg total) by mouth daily. 45 tablet 1  . calcium-vitamin D (OSCAL WITH D) 500-200 MG-UNIT per tablet Take 1 tablet by mouth daily.      . cyanocobalamin 1000 MCG tablet Take 100 mcg by mouth daily.     [name] Kitchen  losartan (COZAAR) 50 MG tablet TAKE 1 TABLET TWICE DAILY. 180 tablet 1  . Multiple Vitamins-Minerals (MULTIVITAMIN WITH MINERALS) tablet Take 1 tablet by mouth daily.    . Omega-3 Fatty Acids (OMEGA 3 PO) Take 1 tablet by mouth daily.       No current facility-administered medications for this visit.     Past Medical History:  Diagnosis Date  . Anxiety   . Atrial fibrillation ([place])   . Barrett's esophagus   . CAD (coronary artery disease)   . Cancer [facility])    Pre cancerous Uterine Tumor  . Carotid artery occlusion   . Chest pain    atypical normal myovue 2008  . COPD (chronic obstructive pulmonary disease) ([place])   . Cough   . Depression   . Fibromyalgia   . GERD (gastroesophageal reflux disease)   . Heart murmur    av sclerosis and mild ar echo 2008  . Hip pain   . History of hiatal hernia   . Hyperlipidemia   . Hypertension   . Insomnia, unspecified   . Kidney stone   . Lumbago   . Myalgia and myositis, unspecified    statin intolerant  . 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 Osteoarthritis   . Osteopenia   . Other B-complex deficiencies   . Peripheral vascular disease ([place])   . Shortness of breath  dyspnea   . Tobacco use disorder   . Unspecified vitamin D deficiency     Social History Social History  Substance Use Topics  . Smoking status: Current Some Day Smoker    Packs/day: 0.10    Years: 57.00    Types: Cigarettes  . Smokeless tobacco: Never Used     Comment: pt states she is "on again off again" not a constant smoker   . Alcohol use No    Family History Family History  Problem Relation Age of Onset  . Arthritis Mother   . Heart disease Mother   . Hypertension Mother   . Heart attack Mother   . Hyperlipidemia Unknown   . Heart disease Son   . Hypertension Son   . Cancer Father   . Hyperlipidemia Sister   . Cancer Brother   . Colon cancer Neg Hx     Surgical History Past Surgical History:   Procedure Laterality Date  . ABDOMINAL AORTIC ANEURYSM REPAIR N/A 05/02/2015   Procedure: REPAIR ABDOMINAL AORTIC ANEURYSM  ,AORTA BI-ILIAC;  Surgeon: Serafina Mitchell, MD;  Location: [place];  Service: Vascular;  Laterality: N/A;  . ABDOMINAL HYSTERECTOMY  1986   Complete  . cataract surgery  2011  . COLONOSCOPY    . JOINT REPLACEMENT  2012   Right Shoulder and arm    Allergies  Allergen Reactions  . Penicillins Rash    Has patient had a PCN reaction causing immediate rash, facial/tongue/throat swelling, SOB or lightheadedness with hypotension: Yes Has patient had a PCN reaction causing severe rash involving mucus membranes or skin necrosis: No Has patient had a PCN reaction that required hospitalization No Has patient had a PCN reaction occurring within the last 10 years: No If all of the above answers are "NO", then may proceed with Cephalosporin use.   Tori Milks [Naproxen] Other (See Comments)    Caused scar tissue   . Benazepril Hcl     REACTION: cough  . Carvedilol     REACTION: ear buzzing ??  . Indomethacin   . Pravastatin Sodium     REACTION: achy  . Rosuvastatin     REACTION: buzzing  . Simvastatin     REACTION: myalgia  . Tramadol     Ears ringing?  [name] Kitchen Advil [Ibuprofen] Rash    Current Outpatient Prescriptions  Medication Sig Dispense Refill  . amLODipine (NORVASC) 5 MG tablet Take 1 tablet (5 mg total) by mouth 2 (two) times daily. 180 tablet 3  . aspirin 81 MG EC tablet Take 81 mg by mouth daily.      [name] Kitchen atenolol (TENORMIN) 25 MG tablet Take 0.5 tablets (12.5 mg total) by mouth daily. 45 tablet 1  . calcium-vitamin D (OSCAL WITH D) 500-200 MG-UNIT per tablet Take 1 tablet by mouth daily.      . cyanocobalamin 1000 MCG tablet Take 100 mcg by mouth daily.     [name] Kitchen losartan (COZAAR) 50 MG tablet TAKE 1 TABLET TWICE DAILY. 180 tablet 1  . Multiple Vitamins-Minerals (MULTIVITAMIN WITH MINERALS) tablet Take 1 tablet by mouth daily.    . Omega-3 Fatty Acids (OMEGA 3 PO)  Take 1 tablet by mouth daily.       No current facility-administered medications for this visit.      REVIEW OF SYSTEMS: See HPI for pertinent positives and negatives.  Physical Examination Vitals:   11/30/16 0926 11/30/16 0929  BP: (!) 153/82 (!) 155/76  Pulse: 61   Resp: 16  Temp: (!) 96.9 F (36.1 C)   TempSrc: Oral   SpO2: 96%   Weight: 175 lb (79.4 kg)   Height: 5\' 4"  (1.626 m)    Body mass index is 30.04 kg/m.  General:  WDWN obese female in NAD Gait: Normal HENT: WNL Eyes: Pupils equal Pulmonary: normal non-labored breathing, fair air movement, CTAB, no rales, rhonchi, or  wheezing Cardiac: RRR, no murmur detected  Abdomen: soft, NT, no masses palpated Skin: no rashes, no ulcers, no cellulitis.   VASCULAR EXAM  Carotid Bruits Right Left   Negative Negative      Radial pulses are 2+ palpable bilaterally   Adominal aortic pulse is not palpable                      VASCULAR EXAM: Extremities without ischemic changes, without Gangrene; without open wounds. All toes of both feet are pink and warm with brisk capillary refill.                                                                                                           LE Pulses Right Left       FEMORAL  1+ palpable  not palpable        POPLITEAL  not palpable   not palpable       POSTERIOR TIBIAL  not palpable   not palpable        DORSALIS PEDIS      ANTERIOR TIBIAL not palpable  not palpable      Musculoskeletal: no muscle wasting or atrophy; no peripheral edema   Neurologic:  A&O X 3; appropriate affect, sensation is normal; speech is normal, CN 2-12 intact, pain and light touch intact in extremities, motor exam as listed above.    ASSESSMENT:  Margaret Foley is a 82 y.o. female who is status post open repair of an abdominal aortic aneurysm using an 18 x 9 bifurcated dacryon graft with reimplantation of the inferior mesenteric artery into the left limb of the graft on  05-02-15. She has no claudication with walking, no signs of ischemia in her feet or legs. Her walking seems limited by chronic back pain.   DATA  ABI (Date: 11/30/2016)  R:   ABI: 0.69 (was 0.65 on 10-07-14),   PT: mono  DP: mono  TBI:  0.52 (was 0.53)  L:   ABI: 0.67 (was 0.93),   PT: mono  DP: mono  TBI: 0.49 (was 0.70) Right ABI is stable and left ABI has declined compared to 2 years ago, all monophasic waveforms   PLAN:   The patient was counseled re smoking cessation and given several free resources re smoking cessation. Daily seated leg exercises discussed and demonstrated in lieu of daily walking program.   Based on today's exam and non-invasive vascular lab results, the patient will follow up in 6 months with the following tests: ABI's. Will check an ultrasound of her abdomen to evaluate for aneurysmal degeneration of the proximal and distal anastomosis in 3 years from her 10-27-15  visit with  Dr. Trula Slade.  I discussed in depth with the patient the nature of atherosclerosis, and emphasized the importance of maximal medical management including strict control of blood pressure, blood glucose, and lipid levels, obtaining regular exercise, and cessation of smoking.  The patient is aware that without maximal medical management the underlying atherosclerotic disease process will progress, limiting the benefit of any interventions.  The patient was given information about PAD including signs, symptoms, treatment, what symptoms should prompt the patient to seek immediate medical care, and risk reduction measures to take.  Thank you for allowing [place] to participate in this patient's care.  Clemon Chambers, RN, MSN, FNP-C [facility] Office: [phone]  Clinic MD: Early 11/30/2016 9:41 AM

## 2016-12-01 NOTE — Progress Notes (Signed)
Pre visit review using our clinic review tool, if applicable. No additional management support is needed unless otherwise documented below in the visit note. 

## 2016-12-01 NOTE — Progress Notes (Signed)
Subjective:   Margaret Foley is a 82 y.o. female who presents for an Initial Medicare Annual Wellness Visit.  Review of Systems    No ROS.  Medicare Wellness Visit. Additional risk factors are reflected in the social history.   Cardiac Risk Factors include: advanced age (>21men, >57 women);dyslipidemia;hypertension Sleep patterns: feels rested on waking, gets up 2 times nightly to void and sleeps 6-8 hours nightly.    Home Safety/Smoke Alarms: Feels safe in home. Smoke alarms in place.  Living environment; residence and Firearm Safety: 2-story house, no firearms. Lives alone, no needs for DME, good support system  Seat Belt Safety/Bike Helmet: Wears seat belt.   Counseling:   Eye Exam- appointment yearly, Dr. Schuyler [facility]- appointment yearly  Female:   Pap- N/A      Mammo- Last 09/20/12        Dexa scan- Last 09/20/12      CCS- N/A     Objective:    Today's Vitals   12/02/16 1422 12/02/16 1431 12/02/16 1640  BP: (!) 152/82  (!) 148/72  Pulse: 64  68  Resp: 20    SpO2: 98%    Weight: 175 lb (79.4 kg)    Height: 5\' 4"  (1.626 m)    PainSc:  4     Body mass index is 30.04 kg/m.   Current Medications (verified) Outpatient Encounter Prescriptions as of 12/02/2016  Medication Sig  . amLODipine (NORVASC) 5 MG tablet Take 1 tablet (5 mg total) by mouth 2 (two) times daily.  [name] Kitchen aspirin 81 MG EC tablet Take 81 mg by mouth daily.    [name] Kitchen atenolol (TENORMIN) 25 MG tablet Take 0.5 tablets (12.5 mg total) by mouth daily.  . calcium-vitamin D (OSCAL WITH D) 500-200 MG-UNIT per tablet Take 1 tablet by mouth daily.    . cyanocobalamin 1000 MCG tablet Take 100 mcg by mouth daily.   [name] Kitchen losartan (COZAAR) 50 MG tablet TAKE 1 TABLET TWICE DAILY.  . Multiple Vitamins-Minerals (MULTIVITAMIN WITH MINERALS) tablet Take 1 tablet by mouth daily.  . Omega-3 Fatty Acids (OMEGA 3 PO) Take 1 tablet by mouth daily.     No facility-administered encounter medications on file as of 12/02/2016.      Allergies (verified) Penicillins; Aleve [naproxen]; Benazepril hcl; Carvedilol; Indomethacin; Pravastatin sodium; Rosuvastatin; Simvastatin; Tramadol; and Advil [ibuprofen]   History: Past Medical History:  Diagnosis Date  . Anxiety   . Atrial fibrillation ([place])   . Barrett's esophagus   . CAD (coronary artery disease)   . Cancer [facility])    Pre cancerous Uterine Tumor  . Carotid artery occlusion   . Chest pain    atypical normal myovue 2008  . COPD (chronic obstructive pulmonary disease) ([place])   . Cough   . Depression   . Fibromyalgia   . GERD (gastroesophageal reflux disease)   . Heart murmur    av sclerosis and mild ar echo 2008  . Hip pain   . History of hiatal hernia   . Hyperlipidemia   . Hypertension   . Insomnia, unspecified   . Kidney stone   . Lumbago   . Myalgia and myositis, unspecified    statin intolerant  . 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 Osteoarthritis   . Osteopenia   . Other B-complex deficiencies   . Peripheral artery disease ([place]) 12/02/2016  . Peripheral vascular disease ([place])   . Shortness of breath dyspnea   . Tobacco use  disorder   . Unspecified vitamin D deficiency    Past Surgical History:  Procedure Laterality Date  . ABDOMINAL AORTIC ANEURYSM REPAIR N/A 05/02/2015   Procedure: REPAIR ABDOMINAL AORTIC ANEURYSM  ,AORTA BI-ILIAC;  Surgeon: Serafina Mitchell, MD;  Location: [place];  Service: Vascular;  Laterality: N/A;  . ABDOMINAL HYSTERECTOMY  1986   Complete  . cataract surgery  2011  . COLONOSCOPY    . JOINT REPLACEMENT  2012   Right Shoulder and arm   Family History  Problem Relation Age of Onset  . Arthritis Mother   . Heart disease Mother   . Hypertension Mother   . Heart attack Mother   . Hyperlipidemia Unknown   . Heart disease Son   . Hypertension Son   . Cancer Father   . Hyperlipidemia Sister   . Cancer Brother   . Colon cancer Neg Hx    Social History    Occupational History  . retired    Social History Main Topics  . Smoking status: Current Some Day Smoker    Packs/day: 0.10    Years: 57.00    Types: Cigarettes  . Smokeless tobacco: Never Used     Comment: pt states she is "on again off again" not a constant smoker   . Alcohol use No  . Drug use: No  . Sexual activity: Not Currently    Tobacco Counseling Ready to quit: Not Answered Counseling given: Not Answered   Activities of Daily Living In your present state of health, do you have any difficulty performing the following activities: 12/02/2016  Hearing? N  Vision? N  Difficulty concentrating or making decisions? N  Walking or climbing stairs? N  Dressing or bathing? N  Doing errands, shopping? N  Preparing Food and eating ? N  Using the Toilet? N  In the past six months, have you accidently leaked urine? N  Do you have problems with loss of bowel control? N  Managing your Medications? N  Managing your Finances? N  Housekeeping or managing your Housekeeping? N  Some recent data might be hidden    Immunizations and Health Maintenance Immunization History  Administered Date(s) Administered  . Influenza Whole 04/05/2006, 04/10/2007, 05/19/2009, 03/06/2010  . Influenza, High Dose Seasonal PF 06/18/2016  . Pneumococcal Polysaccharide-23 02/03/2009   There are no preventive care reminders to display for this patient.  Patient Care Team: Binnie Rail, MD as PCP - General (Internal Medicine) Garald Balding, MD (Orthopedic Surgery) Josue Hector, MD (Cardiology)  Indicate any recent Medical Services you may have received from other than [facility] providers in the past year (date may be approximate).     Assessment:   This is a routine wellness examination for Margaret Foley. Physical assessment deferred to PCP.   Hearing/Vision screen Hearing Screening Comments: Able to hear conversational tones w/o difficulty. No issues reported.  Passed whisper test Vision  Screening Comments: Wears glasses  Dietary issues and exercise activities discussed: Current Exercise Habits: Home exercise routine, Type of exercise: stretching;calisthenics, Time (Minutes): 25, Frequency (Times/Week): 5, Weekly Exercise (Minutes/Week): 125, Intensity: Mild, Exercise limited by: None identified  Diet (meal preparation, eat out, water intake, caffeinated beverages, dairy products, fruits and vegetables): in general, a "healthy" diet  , well balanced, low fat/ cholesterol, low salt, eats a variety of fruits and vegetables daily, limits salt, fat/cholesterol, sugar, caffeine, drinks 6-8 glasses of water daily.   Goals    . Be as active and independent for as long as  possible          Continue to exercise and eat as healthy as I can.      Depression Screen PHQ 2/9 Scores 12/02/2016 10/07/2015  PHQ - 2 Score 2 0  PHQ- 9 Score 3 -    Fall Risk Fall Risk  12/02/2016 10/07/2015  Falls in the past year? No No  Risk for fall due to : Impaired balance/gait;Impaired mobility -    Cognitive Function: MMSE - Mini Mental State Exam 12/02/2016  Orientation to time 5  Orientation to Place 5  Registration 3  Attention/ Calculation 4  Recall 2  Language- name 2 objects 2  Language- repeat 1  Language- follow 3 step command 3  Language- read & follow direction 1  Write a sentence 1  Copy design 1  Total score 28        Screening Tests Health Maintenance  Topic Date Due  . TETANUS/TDAP  12/02/2016 (Originally 07/26/1953)  . PNA vac Low Risk Adult (2 of 2 - PCV13) 12/02/2016 (Originally 02/03/2010)  . INFLUENZA VACCINE  12/15/2016  . DEXA SCAN  Completed      Plan:    Continue doing brain stimulating activities (puzzles, reading, adult coloring books, staying active) to keep memory sharp.   Continue to eat heart healthy diet (full of fruits, vegetables, whole grains, lean protein, water--limit salt, fat, and sugar intake) and increase physical activity as  tolerated.  I have personally reviewed and noted the following in the patient's chart:   . Medical and social history . Use of alcohol, tobacco or illicit drugs  . Current medications and supplements . Functional ability and status . Nutritional status . Physical activity . Advanced directives . List of other physicians . Vitals . Screenings to include cognitive, depression, and falls . Referrals and appointments  In addition, I have reviewed and discussed with patient certain preventive protocols, quality metrics, and best practice recommendations. A written personalized care plan for preventive services as well as general preventive health recommendations were provided to patient.     Michiel Cowboy, RN   12/02/2016

## 2016-12-02 ENCOUNTER — Ambulatory Visit (INDEPENDENT_AMBULATORY_CARE_PROVIDER_SITE_OTHER): Payer: Medicare Other | Admitting: *Deleted

## 2016-12-02 VITALS — BP 148/72 | HR 68 | Resp 20 | Ht 64.0 in | Wt 175.0 lb

## 2016-12-02 DIAGNOSIS — I739 Peripheral vascular disease, unspecified: Secondary | ICD-10-CM

## 2016-12-02 DIAGNOSIS — Z0000 Encounter for general adult medical examination without abnormal findings: Secondary | ICD-10-CM

## 2016-12-02 HISTORY — DX: Peripheral vascular disease, unspecified: I73.9

## 2016-12-02 NOTE — Patient Instructions (Signed)
Continue doing brain stimulating activities (puzzles, reading, adult coloring books, staying active) to keep memory sharp.   Continue to eat heart healthy diet (full of fruits, vegetables, whole grains, lean protein, water--limit salt, fat, and sugar intake) and increase physical activity as tolerated.   Margaret Foley , Thank you for taking time to come for your Medicare Wellness Visit. I appreciate your ongoing commitment to your health goals. Please review the following plan we discussed and let me know if I can assist you in the future.   These are the goals we discussed: Goals    . Be as active and independent for as long as possible          Continue to exercise and eat as healthy as I can.       This is a list of the screening recommended for you and due dates:  Health Maintenance  Topic Date Due  . Tetanus Vaccine  07/26/1953  . Pneumonia vaccines (2 of 2 - PCV13) 02/03/2010  . Flu Shot  12/15/2016  . DEXA scan (bone density measurement)  Completed

## 2016-12-02 NOTE — Progress Notes (Signed)
Medical screening examination/treatment/procedure(s) were performed by he Welness Coach, RN. As primary care provider I was immediately available for consulation/collaboration. I agree with above documentation. Zanyia Silbaugh, AGNP-C 

## 2016-12-03 ENCOUNTER — Other Ambulatory Visit: Payer: Self-pay

## 2016-12-13 NOTE — Addendum Note (Signed)
Addended by: Lianne Cure A on: 12/13/2016 04:19 PM   Modules accepted: Orders

## 2016-12-15 ENCOUNTER — Encounter: Payer: Self-pay | Admitting: Cardiology

## 2016-12-17 ENCOUNTER — Telehealth: Payer: Self-pay | Admitting: Internal Medicine

## 2016-12-24 NOTE — Telephone Encounter (Signed)
Notified pt yes, per chart refill was sen to [facility] on 12/17/16.[name] KitchenJohny Chess

## 2016-12-24 NOTE — Telephone Encounter (Signed)
Pt would like to know if she is supposed to still be taking this, she is out.

## 2017-03-18 ENCOUNTER — Telehealth: Payer: Self-pay | Admitting: Internal Medicine

## 2017-03-18 MED ORDER — ATENOLOL 25 MG PO TABS: 12.5000 mg | ORAL_TABLET | Freq: Every day | ORAL | 0 refills | Status: DC

## 2017-03-18 MED ORDER — AMLODIPINE BESYLATE 5 MG PO TABS: 5.0000 mg | ORAL_TABLET | Freq: Two times a day (BID) | ORAL | 0 refills | Status: DC

## 2017-03-18 NOTE — Telephone Encounter (Signed)
Pt is overdue appt sent a 30 day script until appt...Margaret Foley

## 2017-03-18 NOTE — Telephone Encounter (Signed)
Pt is requesting a refill on amLODipine (NORVASC) 5 MG tablet and atenolol (TENORMIN) 25 MG tablet to [facility].

## 2017-03-23 NOTE — Progress Notes (Signed)
Subjective:    Patient ID: Margaret Foley, female    DOB: 1935-02-12, 81 y.o.   MRN: [id number]  HPI The patient is here for follow up.  Hypertension: She is taking her medication daily. She is compliant with a low sodium diet.  She denies chest pain, palpitations, edema, and regular headaches. She is not exercising regularly, except some balance exercises and her ADL's.  She does monitor her blood pressure at home - well controlled on average - she brought her BP log with her.    CKD:  She does not take any nsaids.  She drinks plenty of water during the day.   Hyperglycemia;  She has had a few mildly elevated sugars in the recent past.   She is compliant with a low sugar/carbohydrate diet.  She is exercising minimally.  Dizziness, off balance:  She is doing PT.  She does the exercises at home.  Some of her exercises have been focused on the dizziness.  Medications and allergies reviewed with patient and updated if appropriate.  Patient Active Problem List   Diagnosis Date Noted  . Hyperglycemia 03/24/2017  . CKD (chronic kidney disease) 02/23/2016  . Dizziness 11/05/2015  . LBBB (left bundle branch block) 04/16/2015  . AAA (abdominal aortic aneurysm) without rupture ([place]) 04/08/2014  . INSOMNIA, CHRONIC 05/19/2009  . PAROXYSMAL ATRIAL FIBRILLATION 02/04/2009  . CAROTID ARTERY DISEASE 02/04/2009  . MURMUR 02/01/2009  . HIP PAIN 10/28/2008  . Essential hypertension 12/13/2007  . MYALGIA 12/13/2007  . PERIPHERAL VASCULAR DISEASE 04/10/2007  . VITAMIN B12 DEFICIENCY 01/05/2007  . VITAMIN D DEFICIENCY 01/05/2007  . Hyperlipidemia 01/05/2007  . TOBACCO USER 01/05/2007  . CORONARY ARTERY DISEASE 01/05/2007  . 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 Barrett's esophagus 01/05/2007  . Osteoarthritis 01/05/2007  . LOW BACK PAIN 01/05/2007  . OSTEOPENIA 01/05/2007    Current Outpatient Medications on File Prior to Visit  Medication Sig Dispense Refill  .  amLODipine (NORVASC) 5 MG tablet Take 1 tablet (5 mg total) by mouth 2 (two) times daily. Overdue for appt must see provider for future refills 60 tablet 0  . aspirin 81 MG EC tablet Take 81 mg by mouth daily.      [name] Kitchen atenolol (TENORMIN) 25 MG tablet Take 0.5 tablets (12.5 mg total) by mouth daily. Overdue for appt must see provider for future refills 15 tablet 0  . calcium-vitamin D (OSCAL WITH D) 500-200 MG-UNIT per tablet Take 1 tablet by mouth daily.      . cyanocobalamin 1000 MCG tablet Take 100 mcg by mouth daily.     [name] Kitchen losartan (COZAAR) 50 MG tablet TAKE 1 TABLET TWICE DAILY. 180 tablet 1  . Multiple Vitamins-Minerals (MULTIVITAMIN WITH MINERALS) tablet Take 1 tablet by mouth daily.    . Omega-3 Fatty Acids (OMEGA 3 PO) Take 1 tablet by mouth daily.       No current facility-administered medications on file prior to visit.     Past Medical History:  Diagnosis Date  . Anxiety   . Atrial fibrillation ([place])   . Barrett's esophagus   . CAD (coronary artery disease)   . Cancer [facility])    Pre cancerous Uterine Tumor  . Carotid artery occlusion   . Chest pain    atypical normal myovue 2008  . COPD (chronic obstructive pulmonary disease) ([place])   . Cough   . Depression   . Fibromyalgia   . GERD (gastroesophageal reflux disease)   . Heart murmur  av sclerosis and mild ar echo 2008  . Hip pain   . History of hiatal hernia   . Hyperlipidemia   . Hypertension   . Insomnia, unspecified   . Kidney stone   . Lumbago   . Myalgia and myositis, unspecified    statin intolerant  . 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 Osteoarthritis   . Osteopenia   . Other B-complex deficiencies   . Peripheral artery disease ([place]) 12/02/2016  . Peripheral vascular disease ([place])   . Shortness of breath dyspnea   . Tobacco use disorder   . Unspecified vitamin D deficiency     Past Surgical History:  Procedure Laterality Date  . ABDOMINAL  HYSTERECTOMY  1986   Complete  . cataract surgery  2011  . COLONOSCOPY    . JOINT REPLACEMENT  2012   Right Shoulder and arm    Social History   Socioeconomic History  . Marital status: Single    Spouse name: Not on file  . Number of children: Not on file  . Years of education: Not on file  . Highest education level: Not on file  Social Needs  . Financial resource strain: Not on file  . Food insecurity - worry: Not on file  . Food insecurity - inability: Not on file  . Transportation needs - medical: Not on file  . Transportation needs - non-medical: Not on file  Occupational History  . Occupation: retired  Tobacco Use  . Smoking status: Current Some Day Smoker    Packs/day: 0.10    Years: 57.00    Pack years: 5.70    Types: Cigarettes  . Smokeless tobacco: Never Used  . Tobacco comment: pt states she is "on again off again" not a constant smoker   Substance and Sexual Activity  . Alcohol use: No    Alcohol/week: 0.0 oz  . Drug use: No  . Sexual activity: Not Currently  Other Topics Concern  . Not on file  Social History Narrative   Regular exercise--yes daily stretching.    Family History  Problem Relation Age of Onset  . Arthritis Mother   . Heart disease Mother   . Hypertension Mother   . Heart attack Mother   . Hyperlipidemia Unknown   . Heart disease Son   . Hypertension Son   . Cancer Father   . Hyperlipidemia Sister   . Cancer Brother   . Colon cancer Neg Hx     Review of Systems  Constitutional: Negative for chills and fever.  HENT: Positive for tinnitus.   Respiratory: Positive for shortness of breath (with exertion). Negative for cough and wheezing.   Cardiovascular: Positive for leg swelling (occ right ankle swelling after standing long time). Negative for chest pain and palpitations.  Musculoskeletal: Positive for gait problem.  Neurological: Positive for dizziness. Negative for headaches.       Objective:   Vitals:   03/24/17 1050    BP: 114/60  Pulse: 67  Resp: 16  Temp: (!) 97.5 F (36.4 C)  SpO2: 98%   Wt Readings from Last 3 Encounters:  03/24/17 177 lb (80.3 kg)  12/02/16 175 lb (79.4 kg)  11/30/16 175 lb (79.4 kg)   Body mass index is 30.38 kg/m.   Physical Exam    Constitutional: Appears well-developed and well-nourished. No distress.  HENT:  Head: Normocephalic and atraumatic.  Neck: Neck supple. No tracheal deviation present. No thyromegaly present.  No  cervical lymphadenopathy Cardiovascular: Normal rate, regular rhythm and normal heart sounds.   No murmur heard. No carotid bruit .  No edema Pulmonary/Chest: Effort normal and breath sounds normal. No respiratory distress. No has no wheezes. No rales.  Skin: Skin is warm and dry. Not diaphoretic.  Psychiatric: Normal mood and affect. Behavior is normal.      Assessment & Plan:    See Problem List for Assessment and Plan of chronic medical problems.

## 2017-03-24 ENCOUNTER — Other Ambulatory Visit (INDEPENDENT_AMBULATORY_CARE_PROVIDER_SITE_OTHER): Payer: Medicare Other

## 2017-03-24 ENCOUNTER — Encounter: Payer: Self-pay | Admitting: Internal Medicine

## 2017-03-24 ENCOUNTER — Ambulatory Visit (INDEPENDENT_AMBULATORY_CARE_PROVIDER_SITE_OTHER): Payer: Medicare Other | Admitting: Internal Medicine

## 2017-03-24 VITALS — BP 114/60 | HR 67 | Temp 97.5°F | Resp 16 | Wt 177.0 lb

## 2017-03-24 DIAGNOSIS — R42 Dizziness and giddiness: Secondary | ICD-10-CM | POA: Diagnosis not present

## 2017-03-24 DIAGNOSIS — I779 Disorder of arteries and arterioles, unspecified: Secondary | ICD-10-CM

## 2017-03-24 DIAGNOSIS — N189 Chronic kidney disease, unspecified: Secondary | ICD-10-CM

## 2017-03-24 DIAGNOSIS — I10 Essential (primary) hypertension: Secondary | ICD-10-CM

## 2017-03-24 DIAGNOSIS — R739 Hyperglycemia, unspecified: Secondary | ICD-10-CM

## 2017-03-24 DIAGNOSIS — Z23 Encounter for immunization: Secondary | ICD-10-CM

## 2017-03-24 DIAGNOSIS — R7303 Prediabetes: Secondary | ICD-10-CM | POA: Insufficient documentation

## 2017-03-24 LAB — HEMOGLOBIN A1C: HEMOGLOBIN A1C: 5.8 % (ref 4.6–6.5)

## 2017-03-24 LAB — CBC WITH DIFFERENTIAL/PLATELET
Basophils Absolute: 0.1 10*3/uL (ref 0.0–0.1)
Basophils Relative: 0.8 % (ref 0.0–3.0)
EOS PCT: 1.3 % (ref 0.0–5.0)
Eosinophils Absolute: 0.1 10*3/uL (ref 0.0–0.7)
HCT: 39.8 % (ref 36.0–46.0)
Hemoglobin: 13 g/dL (ref 12.0–15.0)
LYMPHS ABS: 1.7 10*3/uL (ref 0.7–4.0)
Lymphocytes Relative: 22.5 % (ref 12.0–46.0)
MCHC: 32.8 g/dL (ref 30.0–36.0)
MCV: 89 fl (ref 78.0–100.0)
MONO ABS: 0.7 10*3/uL (ref 0.1–1.0)
MONOS PCT: 9.8 % (ref 3.0–12.0)
NEUTROS ABS: 4.9 10*3/uL (ref 1.4–7.7)
NEUTROS PCT: 65.6 % (ref 43.0–77.0)
PLATELETS: 240 10*3/uL (ref 150.0–400.0)
RBC: 4.47 Mil/uL (ref 3.87–5.11)
RDW: 15 % (ref 11.5–15.5)
WBC: 7.5 10*3/uL (ref 4.0–10.5)

## 2017-03-24 LAB — COMPREHENSIVE METABOLIC PANEL
ALBUMIN: 4.2 g/dL (ref 3.5–5.2)
ALK PHOS: 72 U/L (ref 39–117)
ALT: 9 U/L (ref 0–35)
AST: 14 U/L (ref 0–37)
BILIRUBIN TOTAL: 0.5 mg/dL (ref 0.2–1.2)
BUN: 30 mg/dL — AB (ref 6–23)
CO2: 28 mEq/L (ref 19–32)
CREATININE: 1.46 mg/dL — AB (ref 0.40–1.20)
Calcium: 10.3 mg/dL (ref 8.4–10.5)
Chloride: 107 mEq/L (ref 96–112)
GFR: 36.58 mL/min — ABNORMAL LOW (ref >60.00)
GLUCOSE: 94 mg/dL (ref 70–99)
Potassium: 4 mEq/L (ref 3.5–5.1)
SODIUM: 142 meq/L (ref 135–145)
TOTAL PROTEIN: 7.5 g/dL (ref 6.0–8.3)

## 2017-03-24 LAB — LIPID PANEL
CHOLESTEROL: 266 mg/dL — AB (ref 0–200)
HDL: 49.6 mg/dL (ref >39.00)
NonHDL: 216.4
Total CHOL/HDL Ratio: 5
Triglycerides: 216 mg/dL — ABNORMAL HIGH (ref 0.0–149.0)
VLDL: 43.2 mg/dL — ABNORMAL HIGH (ref 0.0–40.0)

## 2017-03-24 LAB — LDL CHOLESTEROL, DIRECT: Direct LDL: 176 mg/dL

## 2017-03-24 NOTE — Assessment & Plan Note (Signed)
BP well controlled Current regimen effective and well tolerated Continue current medications at current doses cmp  

## 2017-03-24 NOTE — Patient Instructions (Addendum)
  Test(s) ordered today. Your results will be released to [facility] (or called to you) after review, usually within 72hours after test completion. If any changes need to be made, you will be notified at that same time.  All other Health Maintenance issues reviewed.   All recommended immunizations and age-appropriate screenings are up-to-date or discussed.  Flu immunization administered today.   Medications reviewed and updated.  No changes recommended at this time.  Start taking tylenol 1-2 times daily for your pain.  Your prescription(s) have been submitted to your pharmacy. Please take as directed and contact our office if you believe you are having problem(s) with the medication(s).   Please followup in 6 months

## 2017-03-24 NOTE — Assessment & Plan Note (Signed)
Chronic, intermittent Currently doing physical therapy and is involving head exercises for dizziness She is taking precautions against falling

## 2017-03-24 NOTE — Assessment & Plan Note (Addendum)
Does not take any nsaids Drinks plenty of water cmp

## 2017-03-24 NOTE — Assessment & Plan Note (Signed)
Sugars have been a little elevated Check a1c

## 2017-04-13 ENCOUNTER — Other Ambulatory Visit: Payer: Self-pay | Admitting: Internal Medicine

## 2017-06-07 ENCOUNTER — Encounter: Payer: Self-pay | Admitting: Family

## 2017-06-07 ENCOUNTER — Ambulatory Visit (HOSPITAL_COMMUNITY)
Admission: RE | Admit: 2017-06-07 | Discharge: 2017-06-07 | Disposition: A | Discharge status: Home / Self Care | Payer: Medicare Other | Source: Ambulatory Visit | Attending: Family | Admitting: Family

## 2017-06-07 ENCOUNTER — Ambulatory Visit (INDEPENDENT_AMBULATORY_CARE_PROVIDER_SITE_OTHER): Payer: Medicare Other | Admitting: Family

## 2017-06-07 VITALS — BP 140/81 | HR 64 | Temp 97.1°F | Resp 16 | Ht 64.0 in | Wt 180.0 lb

## 2017-06-07 DIAGNOSIS — Z8679 Personal history of other diseases of the circulatory system: Secondary | ICD-10-CM

## 2017-06-07 DIAGNOSIS — I779 Disorder of arteries and arterioles, unspecified: Secondary | ICD-10-CM | POA: Insufficient documentation

## 2017-06-07 DIAGNOSIS — R0989 Other specified symptoms and signs involving the circulatory and respiratory systems: Secondary | ICD-10-CM | POA: Insufficient documentation

## 2017-06-07 DIAGNOSIS — R9389 Abnormal findings on diagnostic imaging of other specified body structures: Secondary | ICD-10-CM | POA: Insufficient documentation

## 2017-06-07 DIAGNOSIS — Z9889 Other specified postprocedural states: Secondary | ICD-10-CM | POA: Diagnosis not present

## 2017-06-07 DIAGNOSIS — I714 Abdominal aortic aneurysm, without rupture, unspecified: Secondary | ICD-10-CM

## 2017-06-07 DIAGNOSIS — F17200 Nicotine dependence, unspecified, uncomplicated: Secondary | ICD-10-CM

## 2017-06-07 NOTE — Patient Instructions (Addendum)
Steps to Quit Smoking Smoking tobacco can be bad for your health. It can also affect almost every organ in your body. Smoking puts you and people around you at risk for many serious long-lasting (chronic) diseases. Quitting smoking is hard, but it is one of the best things that you can do for your health. It is never too late to quit. What are the benefits of quitting smoking? When you quit smoking, you lower your risk for getting serious diseases and conditions. They can include:  Lung cancer or lung disease.  Heart disease.  Stroke.  Heart attack.  Not being able to have children (infertility).  Weak bones (osteoporosis) and broken bones (fractures).  If you have coughing, wheezing, and shortness of breath, those symptoms may get better when you quit. You may also get sick less often. If you are pregnant, quitting smoking can help to lower your chances of having a baby of low birth weight. What can I do to help me quit smoking? Talk with your doctor about what can help you quit smoking. Some things you can do (strategies) include:  Quitting smoking totally, instead of slowly cutting back how much you smoke over a period of time.  Going to in-person counseling. You are more likely to quit if you go to many counseling sessions.  Using resources and support systems, such as: ? Online chats with a counselor. ? Phone quitlines. ? Printed self-help materials. ? Support groups or group counseling. ? Text messaging programs. ? Mobile phone apps or applications.  Taking medicines. Some of these medicines may have nicotine in them. If you are pregnant or breastfeeding, do not take any medicines to quit smoking unless your doctor says it is okay. Talk with your doctor about counseling or other things that can help you.  Talk with your doctor about using more than one strategy at the same time, such as taking medicines while you are also going to in-person counseling. This can help make  quitting easier. What things can I do to make it easier to quit? Quitting smoking might feel very hard at first, but there is a lot that you can do to make it easier. Take these steps:  Talk to your family and friends. Ask them to support and encourage you.  Call phone quitlines, reach out to support groups, or work with a counselor.  Ask people who smoke to not smoke around you.  Avoid places that make you want (trigger) to smoke, such as: ? Bars. ? Parties. ? Smoke-break areas at work.  Spend time with people who do not smoke.  Lower the stress in your life. Stress can make you want to smoke. Try these things to help your stress: ? Getting regular exercise. ? Deep-breathing exercises. ? Yoga. ? Meditating. ? Doing a body scan. To do this, close your eyes, focus on one area of your body at a time from head to toe, and notice which parts of your body are tense. Try to relax the muscles in those areas.  Download or buy apps on your mobile phone or tablet that can help you stick to your quit plan. There are many free apps, such as QuitGuide from the CDC (Centers for Disease Control and Prevention). You can find more support from smokefree.gov and other websites.  This information is not intended to replace advice given to you by your health care provider. Make sure you discuss any questions you have with your health care provider. Document Released: 02/27/2009 Document   Revised: 12/30/2015 Document Reviewed: 09/17/2014 Elsevier Interactive Patient Education  2018 [employer]vier Inc.     Peripheral Vascular Disease Peripheral vascular disease (PVD) is a disease of the blood vessels that are not part of your heart and brain. A simple term for PVD is poor circulation. In most cases, PVD narrows the blood vessels that carry blood from your heart to the rest of your body. This can result in a decreased supply of blood to your arms, legs, and internal organs, like your stomach or kidneys.  However, it most often affects a person's lower legs and feet. There are two types of PVD.  Organic PVD. This is the more common type. It is caused by damage to the structure of blood vessels.  Functional PVD. This is caused by conditions that make blood vessels contract and tighten (spasm).  Without treatment, PVD tends to get worse over time. PVD can also lead to acute ischemic limb. This is when an arm or limb suddenly has trouble getting enough blood. This is a medical emergency. Follow these instructions at home:  Take medicines only as told by your doctor.  Do not use any tobacco products, including cigarettes, chewing tobacco, or electronic cigarettes. If you need help quitting, ask your doctor.  Lose weight if you are overweight, and maintain a healthy weight as told by your doctor.  Eat a diet that is low in fat and cholesterol. If you need help, ask your doctor.  Exercise regularly. Ask your doctor for some good activities for you.  Take good care of your feet. ? Wear comfortable shoes that fit well. ? Check your feet often for any cuts or sores. Contact a doctor if:  You have cramps in your legs while walking.  You have leg pain when you are at rest.  You have coldness in a leg or foot.  Your skin changes.  You are unable to get or have an erection (erectile dysfunction).  You have cuts or sores on your feet that are not healing. Get help right away if:  Your arm or leg turns cold and blue.  Your arms or legs become red, warm, swollen, painful, or numb.  You have chest pain or trouble breathing.  You suddenly have weakness in your face, arm, or leg.  You become very confused or you cannot speak.  You suddenly have a very bad headache.  You suddenly cannot see. This information is not intended to replace advice given to you by your health care provider. Make sure you discuss any questions you have with your health care provider. Document Released:  07/28/2009 Document Revised: 10/09/2015 Document Reviewed: 10/11/2013 Elsevier Interactive Patient Education  2017 Elsevier Inc.  

## 2017-06-07 NOTE — Progress Notes (Signed)
[facility]   [facility]: Follow up s/p Open AAA Repair and peripheral artery occlusive disease   History of Present Illness  Margaret Foley is a 83 y.o. (1934/09/15) female returns for follow-up. She is status post open repair of an abdominal aortic aneurysm using an 18 x 9 bifurcated dacryon graft with reimplantation of the inferior mesenteric artery into the left limb of the graft on 05-02-15 by Dr. Trula Slade. Aortic diameter was 5.1 cm. Her postoperative course has been relatively uncomplicated.  Dr. Trula Slade last evaluated pt on 10-27-15. At that time she continued to recover nicely. Dr. Trula Slade advised pt to follow-up in one year with ABIs. Dr. Trula Slade advised an ultrasound of her abdomen to evaluate for aneurysmal degeneration of the proximal and distal anastomosis in 3 years.  She reports lots or arthritis in her back, especially low back, states she has had injections in her back for this.  She has arthritis pain in her knees, does not have claudication symptoms in her legs with walking. She lives alone and does her own housework.   She denies any known history of stroke or TIA.  She states she has balance issues since 2016, has had physical therapy for this.  She fell on her right shoulder and had reconstructive surgery for this in 2016.   Pt Diabetic: No Pt smoker: smoker (3 cigarettes/day, started at 20 yrs, quit for a long time)  Pt meds include: Statin :No, had myalgia reaction to one statin, "buzzing" reaction to another Betablocker: Yes ASA: Yes Other anticoagulants/antiplatelets: no    Past Medical History:  Diagnosis Date  . Anxiety   . Atrial fibrillation ([employer])   . Barrett's esophagus   . CAD (coronary artery disease)   . Cancer [facility])    Pre cancerous Uterine Tumor  . Carotid artery occlusion   . Chest pain    atypical normal myovue 2008  . COPD (chronic obstructive pulmonary disease) ([place])   . Cough   . Depression   . Fibromyalgia    . GERD (gastroesophageal reflux disease)   . Heart murmur    av sclerosis and mild ar echo 2008  . Hip pain   . History of hiatal hernia   . Hyperlipidemia   . Hypertension   . Insomnia, unspecified   . Kidney stone   . Lumbago   . Myalgia and myositis, unspecified    statin intolerant  . 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 Osteoarthritis   . Osteopenia   . Other B-complex deficiencies   . Peripheral artery disease ([place]) 12/02/2016  . Peripheral vascular disease ([place])   . Shortness of breath dyspnea   . Tobacco use disorder   . Unspecified vitamin D deficiency     Past Surgical History:  Procedure Laterality Date  . ABDOMINAL AORTIC ANEURYSM REPAIR N/A 05/02/2015   Procedure: REPAIR ABDOMINAL AORTIC ANEURYSM  ,AORTA BI-ILIAC;  Surgeon: Serafina Mitchell, MD;  Location: [place];  Service: Vascular;  Laterality: N/A;  . ABDOMINAL HYSTERECTOMY  1986   Complete  . cataract surgery  2011  . COLONOSCOPY    . JOINT REPLACEMENT  2012   Right Shoulder and arm   Social History Social History   Socioeconomic History  . Marital status: Single    Spouse name: Not on file  . Number of children: Not on file  . Years of education: Not on file  . Highest education level: Not on file  Social Needs  . Financial resource strain: Not on file  . Food insecurity - worry: Not on file  . Food insecurity - inability: Not on file  . Transportation needs - medical: Not on file  . Transportation needs - non-medical: Not on file  Occupational History  . Occupation: retired  Tobacco Use  . Smoking status: Current Some Day Smoker    Packs/day: 0.10    Years: 57.00    Pack years: 5.70    Types: Cigarettes  . Smokeless tobacco: Never Used  . Tobacco comment: Pt has not smoked in 2 wks. pt states she is "on again off again" not a constant smoker   Substance and Sexual Activity  . Alcohol use: No    Alcohol/week: 0.0 oz  . Drug use: No  .  Sexual activity: Not Currently  Other Topics Concern  . Not on file  Social History Narrative   Regular exercise--yes daily stretching.   Family History Family History  Problem Relation Age of Onset  . Arthritis Mother   . Heart disease Mother   . Hypertension Mother   . Heart attack Mother   . Hyperlipidemia Unknown   . Heart disease Son   . Hypertension Son   . Cancer Father   . Hyperlipidemia Sister   . Cancer Brother   . Colon cancer Neg Hx    Current Outpatient Medications on File Prior to Visit  Medication Sig Dispense Refill  . Acetaminophen (TYLENOL ARTHRITIS PAIN PO) Take by mouth.    [name] Kitchen amLODipine (NORVASC) 5 MG tablet TAKE 1 TABLET BY MOUTH TWICE DAILY. 180 tablet 1  . atenolol (TENORMIN) 25 MG tablet TAKE 1/2 TABLET DAILY. 45 tablet 1  . losartan (COZAAR) 50 MG tablet TAKE 1 TABLET BY MOUTH TWICE DAILY. 180 tablet 1  . calcium-vitamin D (OSCAL WITH D) 500-200 MG-UNIT per tablet Take 1 tablet by mouth daily.      . cyanocobalamin 1000 MCG tablet Take 100 mcg by mouth daily.     . Multiple Vitamins-Minerals (MULTIVITAMIN WITH MINERALS) tablet Take 1 tablet by mouth daily.     No current facility-administered medications on file prior to visit.    Allergies  Allergen Reactions  . Penicillins Rash    Has patient had a PCN reaction causing immediate rash, facial/tongue/throat swelling, SOB or lightheadedness with hypotension: Yes Has patient had a PCN reaction causing severe rash involving mucus membranes or skin necrosis: No Has patient had a PCN reaction that required hospitalization No Has patient had a PCN reaction occurring within the last 10 years: No If all of the above answers are "NO", then may proceed with Cephalosporin use.   Tori Milks [Naproxen] Other (See Comments)    Caused scar tissue   . Benazepril Hcl     REACTION: cough  . Carvedilol     REACTION: ear buzzing ??  . Indomethacin   . Pravastatin Sodium     REACTION: achy  . Rosuvastatin      REACTION: buzzing  . Simvastatin     REACTION: myalgia  . Tramadol     Ears ringing?  [name] Kitchen Advil [Ibuprofen] Rash    ROS: See HPI for pertinent positives and negatives.    Physical Examination  Vitals:   06/07/17 1042  BP: 140/81  Pulse: 64  Resp: 16  Temp: (!) 97.1 F (36.2 C)  TempSrc: Oral  SpO2: 95%  Weight: 180 lb (81.6 kg)  Height: 5\' 4"  (1.626 m)   Body mass index  is 30.9 kg/m.  General:WDWN obese female in NAD Gait: Slight antalgic limp HENT: No gross abnormalities  Eyes: PERRLA Pulmonary: normal non-labored breathing, fair air movement, CTAB, no rales, rhonchi, or  wheezing Cardiac: RRR, + low grade murmur Abdomen: soft, NT, no masses palpated Skin: no rashes, no ulcers, no cellulitis.   VASCULAR EXAM  Carotid Bruits Right Left   positive Negative      Radial pulses are 2+ palpable bilaterally   Adominal aortic pulse is not palpable                      VASCULAR EXAM: Extremities without ischemic changes, without Gangrene; without open wounds. All toes of both feet are pink and warm with brisk capillary refill.                                                                                                                                                        LE Pulses Right Left       FEMORAL  2+ palpable  1+ palpable        POPLITEAL  not palpable   not palpable       POSTERIOR TIBIAL  not palpable   not palpable        DORSALIS PEDIS      ANTERIOR TIBIAL not palpable  not palpable     Musculoskeletal: no muscle wasting or atrophy; no peripheral edema        Neurologic:  A&O X 3; appropriate affect, sensation is normal; speech is normal, CN 2-12 intact, pain and light touch intact in extremities, motor exam as listed above. Psychiatric: Normal thought content, mood appropriate to clinical situation.    Medical Decision Making  DOREATHA OFFER is a 84 y.o. female who is status post open repair of an abdominal aortic aneurysm  using an 18 x 9 bifurcated dacryon graft with reimplantation of the inferior mesenteric artery into the left limb of the graft on 05-02-15.  She has no claudication with walking, no signs of ischemia in her feet or legs. Her walking seems limited by chronic back and knee pain.   DATA  ABI (Date: 06/07/2017):  R:   ABI: 0.66 (was 0.69 on 11-30-16),   PT: mono  DP: mono  TBI:  0.49 (was 0.52)  L:   ABI: 0.78 (was 0.67),   PT: bi (was mono)  DP: mono  TBI: 0.49 (was 0.49) Stable on the right with monophasic waveforms and moderate disease; improved on the left with bi and monophasic waveforms, moderate disease. Stable bilateral TBI.    PLAN:   The patient was counseled re smoking cessation and given several free resources re smoking cessation. Daily seated leg exercises discussed and demonstrated in lieu of daily walking program.    Based on today's exam  and non-invasive vascular lab results, the patient will follow up in 6 months with the following tests: ABI's.  I advised her to notify us if she develops concerns re the circulation in her feet or legs.    I discussed in depth with the patient the nature of atherosclerosis, and emphasized the importance of maximal medical management including strict control of blood pressure, blood glucose, and lipid levels, obtaining regular exercise, and cessation of smoking.    The patient is aware that without maximal medical management the underlying atherosclerotic disease process will progress, limiting the benefit of any interventions.   Thank you for allowing [place] to participate in this patient's care.  Clemon Chambers, RN, MSN, FNP-C [facility] [place] Office: [phone]   Clinic Physician: Scot Dock   06/07/2017, 10:56 AM

## 2017-06-15 NOTE — Addendum Note (Signed)
Addended by: Lianne Cure A on: 06/15/2017 12:56 PM   Modules accepted: Orders

## 2017-09-20 NOTE — Progress Notes (Signed)
Subjective:    Patient ID: Margaret Foley, female    DOB: 12-24-1934, 83 y.o.   MRN: [id number]  HPI The patient is here for follow up.  Dizziness/lightheadedness: for the past two weeks she has had dizzy spells.  The first time it occurred she was in the kitchen and felt dizziness/lightheadedness and she felt like she was going to pass out.  She laid down for a few minutes and got up and this occurred a couple of times and eventually woke up on the kitchen floor.  She is not sure how long she was out.  She felt this way on her way here today.  She eats regularly and eats a low salt/sugar diet.  She drinks plenty of water every day.    Hypertension: She is taking her medication daily. She is compliant with a low sodium diet.  She denies chest pain, palpitations, edema and regular headaches. She is not exercising regularly.  She does monitor her blood pressure at home sometimes in 120's/? - 130's/?.    CKD:  She drinks water throughout the day.  Does not take any NSAIDs.  Prediabetes:  She is compliant with a low sugar/carbohydrate diet.  She is not exercising regularly.   Tobacco abuse:  She is smoking about 1.5 packs over two weeks.  She knows she should quit.    Medications and allergies reviewed with patient and updated if appropriate.  Patient Active Problem List   Diagnosis Date Noted  . Syncope 09/21/2017  . Prediabetes 03/24/2017  . CKD (chronic kidney disease) 02/23/2016  . Dizziness 11/05/2015  . LBBB (left bundle branch block) 04/16/2015  . AAA (abdominal aortic aneurysm) without rupture ([place]) 04/08/2014  . INSOMNIA, CHRONIC 05/19/2009  . PAROXYSMAL ATRIAL FIBRILLATION 02/04/2009  . CAROTID ARTERY DISEASE 02/04/2009  . MURMUR 02/01/2009  . HIP PAIN 10/28/2008  . Essential hypertension 12/13/2007  . MYALGIA 12/13/2007  . PERIPHERAL VASCULAR DISEASE 04/10/2007  . VITAMIN B12 DEFICIENCY 01/05/2007  . VITAMIN D DEFICIENCY 01/05/2007  . Hyperlipidemia 01/05/2007  .  TOBACCO USER 01/05/2007  . CORONARY ARTERY DISEASE 01/05/2007  . 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 Barrett's esophagus 01/05/2007  . Osteoarthritis 01/05/2007  . LOW BACK PAIN 01/05/2007  . OSTEOPENIA 01/05/2007    Current Outpatient Medications on File Prior to Visit  Medication Sig Dispense Refill  . Acetaminophen (TYLENOL ARTHRITIS PAIN PO) Take by mouth.    [name] Kitchen atenolol (TENORMIN) 25 MG tablet TAKE 1/2 TABLET DAILY. 45 tablet 1  . calcium-vitamin D (OSCAL WITH D) 500-200 MG-UNIT per tablet Take 1 tablet by mouth daily.      . cyanocobalamin 1000 MCG tablet Take 100 mcg by mouth daily.     [name] Kitchen losartan (COZAAR) 50 MG tablet TAKE 1 TABLET BY MOUTH TWICE DAILY. 180 tablet 1  . Multiple Vitamins-Minerals (MULTIVITAMIN WITH MINERALS) tablet Take 1 tablet by mouth daily.     No current facility-administered medications on file prior to visit.     Past Medical History:  Diagnosis Date  . Anxiety   . Atrial fibrillation ([place])   . Barrett's esophagus   . CAD (coronary artery disease)   . Cancer [facility])    Pre cancerous Uterine Tumor  . Carotid artery occlusion   . Chest pain    atypical normal myovue 2008  . COPD (chronic obstructive pulmonary disease) ([place])   . Cough   . Depression   . Fibromyalgia   . GERD (gastroesophageal reflux disease)   .  Heart murmur    av sclerosis and mild ar echo 2008  . Hip pain   . History of hiatal hernia   . Hyperlipidemia   . Hypertension   . Insomnia, unspecified   . Kidney stone   . Lumbago   . Myalgia and myositis, unspecified    statin intolerant  . 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 Osteoarthritis   . Osteopenia   . Other B-complex deficiencies   . Peripheral artery disease ([place]) 12/02/2016  . Peripheral vascular disease ([place])   . Shortness of breath dyspnea   . Tobacco use disorder   . Unspecified vitamin D deficiency     Past Surgical History:  Procedure  Laterality Date  . ABDOMINAL AORTIC ANEURYSM REPAIR N/A 05/02/2015   Procedure: REPAIR ABDOMINAL AORTIC ANEURYSM  ,AORTA BI-ILIAC;  Surgeon: Serafina Mitchell, MD;  Location: [place];  Service: Vascular;  Laterality: N/A;  . ABDOMINAL HYSTERECTOMY  1986   Complete  . cataract surgery  2011  . COLONOSCOPY    . JOINT REPLACEMENT  2012   Right Shoulder and arm    Social History   Socioeconomic History  . Marital status: Single    Spouse name: Not on file  . Number of children: Not on file  . Years of education: Not on file  . Highest education level: Not on file  Occupational History  . Occupation: retired  Scientific laboratory technician  . Financial resource strain: Not on file  . Food insecurity:    Worry: Not on file    Inability: Not on file  . Transportation needs:    Medical: Not on file    Non-medical: Not on file  Tobacco Use  . Smoking status: Current Some Day Smoker    Packs/day: 0.10    Years: 57.00    Pack years: 5.70    Types: Cigarettes  . Smokeless tobacco: Never Used  . Tobacco comment: Pt has not smoked in 2 wks. pt states she is "on again off again" not a constant smoker   Substance and Sexual Activity  . Alcohol use: No    Alcohol/week: 0.0 oz  . Drug use: No  . Sexual activity: Not Currently  Lifestyle  . Physical activity:    Days per week: Not on file    Minutes per session: Not on file  . Stress: Not on file  Relationships  . Social connections:    Talks on phone: Not on file    Gets together: Not on file    Attends religious service: Not on file    Active member of club or organization: Not on file    Attends meetings of clubs or organizations: Not on file    Relationship status: Not on file  Other Topics Concern  . Not on file  Social History Narrative   Regular exercise--yes daily stretching.    Family History  Problem Relation Age of Onset  . Arthritis Mother   . Heart disease Mother   . Hypertension Mother   . Heart attack Mother   .  Hyperlipidemia Unknown   . Heart disease Son   . Hypertension Son   . Cancer Father   . Hyperlipidemia Sister   . Cancer Brother   . Colon cancer Neg Hx     Review of Systems  Constitutional: Negative for chills and fever.  Respiratory: Positive for shortness of breath and wheezing (occ from smoking). Negative for cough.  Cardiovascular: Negative for chest pain, palpitations and leg swelling.  Musculoskeletal: Positive for arthralgias and gait problem.  Neurological: Positive for dizziness and light-headedness. Negative for weakness and numbness.       Objective:   Vitals:   09/21/17 0940  BP: 130/62  Pulse: 70  Resp: 16  Temp: 98 F (36.7 C)  SpO2: 98%   BP Readings from Last 3 Encounters:  09/21/17 130/62  06/07/17 140/81  03/24/17 114/60   Wt Readings from Last 3 Encounters:  06/07/17 180 lb (81.6 kg)  03/24/17 177 lb (80.3 kg)  12/02/16 175 lb (79.4 kg)   There is no height or weight on file to calculate BMI.  Mild orthostatic changes going from sitting to standing SBP 140 sitting-120s standing   Physical Exam    Constitutional: Appears well-developed and well-nourished. No distress.  HENT:  Head: Normocephalic and atraumatic.  Neck: Neck supple. No tracheal deviation present. No thyromegaly present.  No cervical lymphadenopathy Cardiovascular: Normal rate, regular rhythm and normal heart sounds.   No murmur heard. No carotid bruit .  No edema Pulmonary/Chest: Effort normal and breath sounds normal. No respiratory distress. No has no wheezes. No rales.  Skin: Skin is warm and dry. Not diaphoretic.  Psychiatric: Normal mood and affect. Behavior is normal.      Assessment & Plan:    See Problem List for Assessment and Plan of chronic medical problems.

## 2017-09-20 NOTE — Patient Instructions (Addendum)
Call cardiology and move up your appointment  - let them know you fainted.  Test(s) ordered today. Your results will be released to [place] (or called to you) after review, usually within 72hours after test completion. If any changes need to be made, you will be notified at that same time.   Medications reviewed and updated.  Changes include decreasing amlodipine to 5 mg daily (not twice a day).  Continue your other medications.     Please followup in 6 months, sooner if needed

## 2017-09-21 ENCOUNTER — Other Ambulatory Visit (INDEPENDENT_AMBULATORY_CARE_PROVIDER_SITE_OTHER): Payer: Medicare Other

## 2017-09-21 ENCOUNTER — Encounter: Payer: Self-pay | Admitting: Internal Medicine

## 2017-09-21 ENCOUNTER — Ambulatory Visit (INDEPENDENT_AMBULATORY_CARE_PROVIDER_SITE_OTHER): Payer: Medicare Other | Admitting: Internal Medicine

## 2017-09-21 VITALS — BP 130/62 | HR 70 | Temp 98.0°F | Resp 16

## 2017-09-21 DIAGNOSIS — R55 Syncope and collapse: Secondary | ICD-10-CM

## 2017-09-21 DIAGNOSIS — I10 Essential (primary) hypertension: Secondary | ICD-10-CM

## 2017-09-21 DIAGNOSIS — N189 Chronic kidney disease, unspecified: Secondary | ICD-10-CM

## 2017-09-21 DIAGNOSIS — F17200 Nicotine dependence, unspecified, uncomplicated: Secondary | ICD-10-CM

## 2017-09-21 DIAGNOSIS — R7303 Prediabetes: Secondary | ICD-10-CM | POA: Diagnosis not present

## 2017-09-21 DIAGNOSIS — I779 Disorder of arteries and arterioles, unspecified: Secondary | ICD-10-CM | POA: Diagnosis not present

## 2017-09-21 LAB — LIPID PANEL
CHOL/HDL RATIO: 5
Cholesterol: 233 mg/dL — ABNORMAL HIGH (ref 0–200)
HDL: 46.9 mg/dL (ref >39.00)
LDL CALC: 150 mg/dL — AB (ref 0–99)
NONHDL: 185.62
TRIGLYCERIDES: 179 mg/dL — AB (ref 0.0–149.0)
VLDL: 35.8 mg/dL (ref 0.0–40.0)

## 2017-09-21 LAB — CBC WITH DIFFERENTIAL/PLATELET
BASOS ABS: 0.1 10*3/uL (ref 0.0–0.1)
Basophils Relative: 0.8 % (ref 0.0–3.0)
Eosinophils Absolute: 0.1 10*3/uL (ref 0.0–0.7)
Eosinophils Relative: 0.8 % (ref 0.0–5.0)
HCT: 37.9 % (ref 36.0–46.0)
Hemoglobin: 12.8 g/dL (ref 12.0–15.0)
LYMPHS PCT: 19.4 % (ref 12.0–46.0)
Lymphs Abs: 1.8 10*3/uL (ref 0.7–4.0)
MCHC: 33.7 g/dL (ref 30.0–36.0)
MCV: 86.8 fl (ref 78.0–100.0)
MONOS PCT: 10.2 % (ref 3.0–12.0)
Monocytes Absolute: 0.9 10*3/uL (ref 0.1–1.0)
NEUTROS PCT: 68.8 % (ref 43.0–77.0)
Neutro Abs: 6.4 10*3/uL (ref 1.4–7.7)
Platelets: 231 10*3/uL (ref 150.0–400.0)
RBC: 4.37 Mil/uL (ref 3.87–5.11)
RDW: 15.3 % (ref 11.5–15.5)
WBC: 9.3 10*3/uL (ref 4.0–10.5)

## 2017-09-21 LAB — HEMOGLOBIN A1C: Hgb A1c MFr Bld: 5.8 % (ref 4.6–6.5)

## 2017-09-21 LAB — COMPREHENSIVE METABOLIC PANEL
ALK PHOS: 74 U/L (ref 39–117)
ALT: 10 U/L (ref 0–35)
AST: 15 U/L (ref 0–37)
Albumin: 3.9 g/dL (ref 3.5–5.2)
BILIRUBIN TOTAL: 0.4 mg/dL (ref 0.2–1.2)
BUN: 29 mg/dL — AB (ref 6–23)
CO2: 27 mEq/L (ref 19–32)
CREATININE: 1.42 mg/dL — AB (ref 0.40–1.20)
Calcium: 9.8 mg/dL (ref 8.4–10.5)
Chloride: 106 mEq/L (ref 96–112)
GFR: 37.72 mL/min — ABNORMAL LOW (ref >60.00)
GLUCOSE: 80 mg/dL (ref 70–99)
Potassium: 4.2 mEq/L (ref 3.5–5.1)
Sodium: 141 mEq/L (ref 135–145)
TOTAL PROTEIN: 7.1 g/dL (ref 6.0–8.3)

## 2017-09-21 LAB — TSH: TSH: 1.84 u[IU]/mL (ref 0.35–4.50)

## 2017-09-21 MED ORDER — AMLODIPINE BESYLATE 5 MG PO TABS: 5.0000 mg | ORAL_TABLET | Freq: Every day | ORAL | 1 refills | Status: DC

## 2017-09-21 NOTE — Assessment & Plan Note (Signed)
Compliant with plenty of water throughout the day Avoid NSAIDs CMP

## 2017-09-21 NOTE — Assessment & Plan Note (Signed)
She is compliant with a low sugar/carb hydrate diet She is not exercising regularly due to arthritis Check A1c

## 2017-09-21 NOTE — Assessment & Plan Note (Signed)
Having lightheadedness - some orthostatic change in BP Had a syncopal episode - ? Vasovagal Dec amlodipine from 5 mg BID to 5 mg daily Continue other BP medications cmp

## 2017-09-21 NOTE — Assessment & Plan Note (Signed)
Recent syncope and lightheadedness Possibly vagovagal - she has some orthostatic changes here today Decrease amlodipine from 5 mg twice daily to 5 mg daily Continue other medications Advised her to call cardiology and see them as soon as possible for further evaluation of other causes of cardiac related syncope Check basic blood work today CBC, CMP

## 2017-09-21 NOTE — Assessment & Plan Note (Signed)
Stressed smoking cessation 

## 2017-09-23 ENCOUNTER — Other Ambulatory Visit: Payer: Self-pay | Admitting: Internal Medicine

## 2017-09-23 DIAGNOSIS — N189 Chronic kidney disease, unspecified: Secondary | ICD-10-CM

## 2017-09-29 ENCOUNTER — Other Ambulatory Visit: Payer: Self-pay | Admitting: Internal Medicine

## 2017-10-11 ENCOUNTER — Other Ambulatory Visit: Payer: Self-pay | Admitting: Internal Medicine

## 2017-12-05 ENCOUNTER — Encounter: Payer: Self-pay | Admitting: Family

## 2017-12-05 ENCOUNTER — Other Ambulatory Visit: Payer: Self-pay

## 2017-12-05 ENCOUNTER — Ambulatory Visit (HOSPITAL_COMMUNITY)
Admission: RE | Admit: 2017-12-05 | Discharge: 2017-12-05 | Disposition: A | Discharge status: Home / Self Care | Payer: Medicare Other | Source: Ambulatory Visit | Attending: Family | Admitting: Family

## 2017-12-05 ENCOUNTER — Ambulatory Visit (INDEPENDENT_AMBULATORY_CARE_PROVIDER_SITE_OTHER): Payer: Medicare Other | Admitting: Family

## 2017-12-05 VITALS — BP 132/71 | HR 64 | Temp 97.4°F | Resp 16 | Ht 64.0 in | Wt 180.5 lb

## 2017-12-05 DIAGNOSIS — I714 Abdominal aortic aneurysm, without rupture, unspecified: Secondary | ICD-10-CM

## 2017-12-05 DIAGNOSIS — F17200 Nicotine dependence, unspecified, uncomplicated: Secondary | ICD-10-CM | POA: Insufficient documentation

## 2017-12-05 DIAGNOSIS — I779 Disorder of arteries and arterioles, unspecified: Secondary | ICD-10-CM | POA: Insufficient documentation

## 2017-12-05 DIAGNOSIS — Z8679 Personal history of other diseases of the circulatory system: Secondary | ICD-10-CM

## 2017-12-05 DIAGNOSIS — Z9889 Other specified postprocedural states: Secondary | ICD-10-CM | POA: Diagnosis not present

## 2017-12-05 NOTE — Progress Notes (Signed)
[facility] [place]   CC: Follow up s/p Open AAA Repair and peripheral artery occlusive disease   History of Present Illness Margaret Foley is a 83 y.o. female is status post open repair of an abdominal aortic aneurysm using an 18 x 9 bifurcated dacryon graft with reimplantation of the inferior mesenteric artery into the left limb of the grafton 05-02-15 by Dr. Trula Slade. Aortic diameter was 5.1 cm. Her postoperative course has been relatively uncomplicated.   Dr. Trula Slade last evaluated pt on 10-27-15. At that time shecontinuedto recover nicely. Dr. Trula Slade advised pt tofollow-up in one year with ABIs. Dr. Jacquenette Shone of her abdomen to evaluate for aneurysmal degeneration of the proximal and distal anastomosis in 3 years.  She reports lots or arthritis in her back, especially low back, states she has had injections in her back for this.  She has arthritis pain in her knees, does not have claudication symptoms in her legs with walking. She lives alone and does her own housework.   She denies any known history of stroke or TIA.  She states she has balance issues since 2016, has had physical therapy for this.  She fell on her right shoulder and had reconstructive surgery for this in 2016.   She reports trouble with urinary incontinence for the last couple of years.   Pt Diabetic:No Pt smoker:smoker (3 cigarettes/day, started at 60yrs, quit for a long time)  Pt meds include: Statin :No, had myalgia reaction to one statin Betablocker:Yes ASA:no, she forgot that she was supposed to be taking Other anticoagulants/antiplatelets:no     Past Medical History:  Diagnosis Date  . Anxiety   . Atrial fibrillation ([place])   . Barrett's esophagus   . CAD (coronary artery disease)   . Cancer [facility])    Pre cancerous Uterine Tumor  . Carotid artery occlusion   . Chest pain    atypical normal myovue 2008  . COPD (chronic obstructive pulmonary  disease) ([place])   . Cough   . Depression   . Fibromyalgia   . GERD (gastroesophageal reflux disease)   . Heart murmur    av sclerosis and mild ar echo 2008  . Hip pain   . History of hiatal hernia   . Hyperlipidemia   . Hypertension   . Insomnia, unspecified   . Kidney stone   . Lumbago   . Myalgia and myositis, unspecified    statin intolerant  . 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 Osteoarthritis   . Osteopenia   . Other B-complex deficiencies   . Peripheral artery disease ([place]) 12/02/2016  . Peripheral vascular disease ([place])   . Shortness of breath dyspnea   . Tobacco use disorder   . Unspecified vitamin D deficiency     Social History Social History   Tobacco Use  . Smoking status: Current Some Day Smoker    Packs/day: 0.10    Years: 57.00    Pack years: 5.70    Types: Cigarettes  . Smokeless tobacco: Never Used  . Tobacco comment: Pt has not smoked in 2 wks. pt states she is "on again off again" not a constant smoker   Substance Use Topics  . Alcohol use: No    Alcohol/week: 0.0 oz  . Drug use: No    Family History Family History  Problem Relation Age of Onset  . Arthritis Mother   . Heart disease Mother   . Hypertension Mother   .  Heart attack Mother   . Hyperlipidemia Unknown   . Heart disease Son   . Hypertension Son   . Cancer Father   . Hyperlipidemia Sister   . Cancer Brother   . Colon cancer Neg Hx     Past Surgical History:  Procedure Laterality Date  . ABDOMINAL AORTIC ANEURYSM REPAIR N/A 05/02/2015   Procedure: REPAIR ABDOMINAL AORTIC ANEURYSM  ,AORTA BI-ILIAC;  Surgeon: Serafina Mitchell, MD;  Location: [facility];  Service: Vascular;  Laterality: N/A;  . ABDOMINAL HYSTERECTOMY  1986   Complete  . cataract surgery  2011  . COLONOSCOPY    . JOINT REPLACEMENT  2012   Right Shoulder and arm    Allergies  Allergen Reactions  . Penicillins Rash    Has patient had a PCN reaction causing  immediate rash, facial/tongue/throat swelling, SOB or lightheadedness with hypotension: Yes Has patient had a PCN reaction causing severe rash involving mucus membranes or skin necrosis: No Has patient had a PCN reaction that required hospitalization No Has patient had a PCN reaction occurring within the last 10 years: No If all of the above answers are "NO", then may proceed with Cephalosporin use.   Tori Milks [Naproxen] Other (See Comments)    Caused scar tissue   . Benazepril Hcl     REACTION: cough  . Carvedilol     REACTION: ear buzzing ??  . Indomethacin   . Pravastatin Sodium     REACTION: achy  . Rosuvastatin     REACTION: buzzing  . Simvastatin     REACTION: myalgia  . Tramadol     Ears ringing?  [name] Kitchen Advil [Ibuprofen] Rash    Current Outpatient Medications  Medication Sig Dispense Refill  . Acetaminophen (TYLENOL ARTHRITIS PAIN PO) Take by mouth.    [name] Kitchen amLODipine (NORVASC) 5 MG tablet TAKE 1 TABLET BY MOUTH TWICE DAILY. 180 tablet 0  . atenolol (TENORMIN) 25 MG tablet TAKE 1/2 TABLET DAILY. 45 tablet 1  . calcium-vitamin D (OSCAL WITH D) 500-200 MG-UNIT per tablet Take 1 tablet by mouth daily.      . cyanocobalamin 1000 MCG tablet Take 100 mcg by mouth daily.     [name] Kitchen losartan (COZAAR) 50 MG tablet TAKE 1 TABLET BY MOUTH TWICE DAILY. 180 tablet 1  . Multiple Vitamins-Minerals (MULTIVITAMIN WITH MINERALS) tablet Take 1 tablet by mouth daily.     No current facility-administered medications for this visit.     ROS: See HPI for pertinent positives and negatives.   Physical Examination  Vitals:   12/05/17 1441  BP: 132/71  Pulse: 64  Resp: 16  Temp: (!) 97.4 F (36.3 C)  TempSrc: Oral  SpO2: 97%  Weight: 180 lb 8 oz (81.9 kg)  Height: 5\' 4"  (1.626 m)   Body mass index is 30.98 kg/m.  General: WDWN obesefemalein NAD Gait:Slight antalgic limp HENT: No gross abnormalities  Eyes:PERRLA Pulmonary: non-labored breathing,fair air movement, no rales, rhonchi, or  wheezes Cardiac:RRR, + low grade murmur Abdomen: soft, NT, no masses palpated Skin: no rashes, no ulcers, no cellulitis.   VASCULAR EXAM  Carotid Bruits Right Left   positive positive   Radial pulses are2+palpable bilaterally  Adominal aortic pulse isnotpalpable   VASCULAR EXAM: Extremitieswithoutischemic changes, withoutGangrene; withoutopen wounds. All toes of both feet are pink and warm with adequate capillary refill.  LE Pulses Right Left  FEMORAL notpalpable 2+palpable   POPLITEAL notpalpable  notpalpable  POSTERIOR TIBIAL notpalpable  notpalpable   DORSALIS PEDIS ANTERIOR TIBIAL notpalpable  notpalpable     Musculoskeletal: no muscle wasting or atrophy; no peripheral edema Neurologic: A&O X 3; appropriate affect, sensation is normal; speech is normal, CN 2-12 intact, pain and light touch intact in extremities, motor exam as listed above. Psychiatric: Normal thought content, mood appropriate to clinical situation    ASSESSMENT: Margaret Foley is a 83 y.o. female whois status post open repair of an abdominal aortic aneurysm using an 18 x 9 bifurcated dacryon graft with reimplantation of the inferior mesenteric artery into the left limb of the grafton 05-02-15.  She has no claudication with walking, no signs of ischemia in her feet or legs. Her walking seems limited by chronic back and knee pain. She does not have DM but continues to smoke about 3 cigarettes daily.   Dr. Jacquenette Shone of her abdomen to evaluate for aneurysmal degeneration of the proximal and distal anastomosis in 3 years, which will be due about June 2020.     DATA  ABI (Date: 12/05/2017):  R:   ABI: 0.62 (was 0.66 on 06-07-17),   PT: mono  DP: mono  TBI:  0.53, toe pressure 78 (was 0.49)  L:   ABI: 0.64 (was 0.78),   PT: mono  DP: mono  TBI: 0.23, toe pressure  34 (was 0.49)  Stable right ABI and TBI with moderate disease.  Decline in left ABI and TBI with moderate disease.  All monophasic waveforms.    Carotid Duplex (11-28-15) requested by Dr. Fletcher Anon: Stable heterogeneous plaque, bilaterally. Stable 1-39% bilateral ICA stenosis. Normal subclavian arteries, bilaterally. Right vertebral artery is antegrade with abnormal flow. Left vertebral artery is patent with antegrade flow.   PLAN:  Resume 81 mg daily ASA to reduce risk of CVD event.   The patient was counseled re smoking cessation and given several free resources re smoking cessation. Daily seated leg exercises discussed and demonstrated in lieu of daily walking program.   Based on today's exam and non-invasive vascular lab results, the patient will follow up in6 monthswith the following tests: ABI's. I advised her to notify us if she develops concerns re the circulation in her feet or legs.     I discussed in depth with the patient the nature of atherosclerosis, and emphasized the importance of maximal medical management including strict control of blood pressure, blood glucose, and lipid levels, obtaining regular exercise, and cessation of smoking.  The patient is aware that without maximal medical management the underlying atherosclerotic disease process will progress, limiting the benefit of any interventions.  The patient was given information about PAD including signs, symptoms, treatment, what symptoms should prompt the patient to seek immediate medical care, and risk reduction measures to take.  Clemon Chambers, RN, MSN, FNP-C [facility] Phone: [phone]  Clinic MD: Trula Slade  12/05/17 2:58 PM

## 2017-12-05 NOTE — Patient Instructions (Signed)
Steps to Quit Smoking Smoking tobacco can be bad for your health. It can also affect almost every organ in your body. Smoking puts you and people around you at risk for many serious long-lasting (chronic) diseases. Quitting smoking is hard, but it is one of the best things that you can do for your health. It is never too late to quit. What are the benefits of quitting smoking? When you quit smoking, you lower your risk for getting serious diseases and conditions. They can include:  Lung cancer or lung disease.  Heart disease.  Stroke.  Heart attack.  Not being able to have children (infertility).  Weak bones (osteoporosis) and broken bones (fractures).  If you have coughing, wheezing, and shortness of breath, those symptoms may get better when you quit. You may also get sick less often. If you are pregnant, quitting smoking can help to lower your chances of having a baby of low birth weight. What can I do to help me quit smoking? Talk with your doctor about what can help you quit smoking. Some things you can do (strategies) include:  Quitting smoking totally, instead of slowly cutting back how much you smoke over a period of time.  Going to in-person counseling. You are more likely to quit if you go to many counseling sessions.  Using resources and support systems, such as: ? Online chats with a counselor. ? Phone quitlines. ? Printed self-help materials. ? Support groups or group counseling. ? Text messaging programs. ? Mobile phone apps or applications.  Taking medicines. Some of these medicines may have nicotine in them. If you are pregnant or breastfeeding, do not take any medicines to quit smoking unless your doctor says it is okay. Talk with your doctor about counseling or other things that can help you.  Talk with your doctor about using more than one strategy at the same time, such as taking medicines while you are also going to in-person counseling. This can help make  quitting easier. What things can I do to make it easier to quit? Quitting smoking might feel very hard at first, but there is a lot that you can do to make it easier. Take these steps:  Talk to your family and friends. Ask them to support and encourage you.  Call phone quitlines, reach out to support groups, or work with a counselor.  Ask people who smoke to not smoke around you.  Avoid places that make you want (trigger) to smoke, such as: ? Bars. ? Parties. ? Smoke-break areas at work.  Spend time with people who do not smoke.  Lower the stress in your life. Stress can make you want to smoke. Try these things to help your stress: ? Getting regular exercise. ? Deep-breathing exercises. ? Yoga. ? Meditating. ? Doing a body scan. To do this, close your eyes, focus on one area of your body at a time from head to toe, and notice which parts of your body are tense. Try to relax the muscles in those areas.  Download or buy apps on your mobile phone or tablet that can help you stick to your quit plan. There are many free apps, such as QuitGuide from the CDC (Centers for Disease Control and Prevention). You can find more support from smokefree.gov and other websites.  This information is not intended to replace advice given to you by your health care provider. Make sure you discuss any questions you have with your health care provider. Document Released: 02/27/2009 Document   Revised: 12/30/2015 Document Reviewed: 09/17/2014 Elsevier Interactive Patient Education  2018 [employer]     Peripheral Vascular Disease Peripheral vascular disease (PVD) is a disease of the blood vessels that are not part of your heart and brain. A simple term for PVD is poor circulation. In most cases, PVD narrows the blood vessels that carry blood from your heart to the rest of your body. This can result in a decreased supply of blood to your arms, legs, and internal organs, like your stomach or kidneys.  However, it most often affects a person's lower legs and feet. There are two types of PVD.  Organic PVD. This is the more common type. It is caused by damage to the structure of blood vessels.  Functional PVD. This is caused by conditions that make blood vessels contract and tighten (spasm).  Without treatment, PVD tends to get worse over time. PVD can also lead to acute ischemic limb. This is when an arm or limb suddenly has trouble getting enough blood. This is a medical emergency. Follow these instructions at home:  Take medicines only as told by your doctor.  Do not use any tobacco products, including cigarettes, chewing tobacco, or electronic cigarettes. If you need help quitting, ask your doctor.  Lose weight if you are overweight, and maintain a healthy weight as told by your doctor.  Eat a diet that is low in fat and cholesterol. If you need help, ask your doctor.  Exercise regularly. Ask your doctor for some good activities for you.  Take good care of your feet. ? Wear comfortable shoes that fit well. ? Check your feet often for any cuts or sores. Contact a doctor if:  You have cramps in your legs while walking.  You have leg pain when you are at rest.  You have coldness in a leg or foot.  Your skin changes.  You are unable to get or have an erection (erectile dysfunction).  You have cuts or sores on your feet that are not healing. Get help right away if:  Your arm or leg turns cold and blue.  Your arms or legs become red, warm, swollen, painful, or numb.  You have chest pain or trouble breathing.  You suddenly have weakness in your face, arm, or leg.  You become very confused or you cannot speak.  You suddenly have a very bad headache.  You suddenly cannot see. This information is not intended to replace advice given to you by your health care provider. Make sure you discuss any questions you have with your health care provider. Document Released:  07/28/2009 Document Revised: 10/09/2015 Document Reviewed: 10/11/2013 Elsevier Interactive Patient Education  2017 [employer] Inc.  

## 2017-12-06 ENCOUNTER — Ambulatory Visit: Payer: Medicare Other

## 2017-12-14 ENCOUNTER — Ambulatory Visit (INDEPENDENT_AMBULATORY_CARE_PROVIDER_SITE_OTHER): Payer: Medicare Other | Admitting: *Deleted

## 2017-12-14 VITALS — BP 134/64 | HR 53 | Resp 17 | Ht 64.0 in | Wt 180.0 lb

## 2017-12-14 DIAGNOSIS — Z0000 Encounter for general adult medical examination without abnormal findings: Secondary | ICD-10-CM | POA: Diagnosis not present

## 2017-12-14 DIAGNOSIS — Z23 Encounter for immunization: Secondary | ICD-10-CM

## 2017-12-14 NOTE — Patient Instructions (Addendum)
Continue doing brain stimulating activities (puzzles, reading, adult coloring books, staying active) to keep memory sharp.  Continue to eat heart healthy diet (full of fruits, vegetables, whole grains, lean protein, water--limit salt, fat, and sugar intake) and increase physical activity as tolerated.     Ms. Margaret Foley , Thank you for taking time to come for your Medicare Wellness Visit. I appreciate your ongoing commitment to your health goals. Please review the following plan we discussed and let me know if I can assist you in the future.   These are the goals we discussed: Goals    . Patient Stated     Lose weight by increasing physical activity and eating healthy.       This is a list of the screening recommended for you and due dates:  Health Maintenance  Topic Date Due  . Tetanus Vaccine  07/26/1953  . Pneumonia vaccines (2 of 2 - PCV13) 02/03/2010  . Flu Shot  12/15/2017  . DEXA scan (bone density measurement)  Completed     Kegel Exercises Kegel exercises help strengthen the muscles that support the rectum, vagina, small intestine, bladder, and uterus. Doing Kegel exercises can help:  Improve bladder and bowel control.  Improve sexual response.  Reduce problems and discomfort during pregnancy.  Kegel exercises involve squeezing your pelvic floor muscles, which are the same muscles you squeeze when you try to stop the flow of urine. The exercises can be done while sitting, standing, or lying down, but it is best to vary your position. Phase 1 exercises 1. Squeeze your pelvic floor muscles tight. You should feel a tight lift in your rectal area. If you are a female, you should also feel a tightness in your vaginal area. Keep your stomach, buttocks, and legs relaxed. 2. Hold the muscles tight for up to 10 seconds. 3. Relax your muscles. Repeat this exercise 50 times a day or as many times as told by your health care provider. Continue to do this exercise for at least 4-6  weeks or for as long as told by your health care provider. This information is not intended to replace advice given to you by your health care provider. Make sure you discuss any questions you have with your health care provider. Document Released: 04/19/2012 Document Revised: 12/27/2015 Document Reviewed: 03/23/2015 Elsevier Interactive Patient Education  2018 [employer] Eating Plan DASH stands for "Dietary Approaches to Stop Hypertension." The DASH eating plan is a healthy eating plan that has been shown to reduce high blood pressure (hypertension). It may also reduce your risk for type 2 diabetes, heart disease, and stroke. The DASH eating plan may also help with weight loss. What are tips for following this plan? General guidelines Avoid eating more than 2,300 mg (milligrams) of salt (sodium) a day. If you have hypertension, you may need to reduce your sodium intake to 1,500 mg a day. Limit alcohol intake to no more than 1 drink a day for nonpregnant women and 2 drinks a day for men. One drink equals 12 oz of beer, 5 oz of wine, or 1 oz of hard liquor. Work with your health care provider to maintain a healthy body weight or to lose weight. Ask what an ideal weight is for you. Get at least 30 minutes of exercise that causes your heart to beat faster (aerobic exercise) most days of the week. Activities may include walking, swimming, or biking. Work with your health care provider or diet and nutrition specialist (dietitian)  to adjust your eating plan to your individual calorie needs. Reading food labels Check food labels for the amount of sodium per serving. Choose foods with less than 5 percent of the Daily Value of sodium. Generally, foods with less than 300 mg of sodium per serving fit into this eating plan. To find whole grains, look for the word "whole" as the first word in the ingredient list. Shopping Buy products labeled as "low-sodium" or "no salt added." Buy fresh foods.  Avoid canned foods and premade or frozen meals. Cooking Avoid adding salt when cooking. Use salt-free seasonings or herbs instead of table salt or sea salt. Check with your health care provider or pharmacist before using salt substitutes. Do not fry foods. Cook foods using healthy methods such as baking, boiling, grilling, and broiling instead. Cook with heart-healthy oils, such as olive, canola, soybean, or sunflower oil. Meal planning  Eat a balanced diet that includes: 5 or more servings of fruits and vegetables each day. At each meal, try to fill half of your plate with fruits and vegetables. Up to 6-8 servings of whole grains each day. Less than 6 oz of lean meat, poultry, or fish each day. A 3-oz serving of meat is about the same size as a deck of cards. One egg equals 1 oz. 2 servings of low-fat dairy each day. A serving of nuts, seeds, or beans 5 times each week. Heart-healthy fats. Healthy fats called Omega-3 fatty acids are found in foods such as flaxseeds and coldwater fish, like sardines, salmon, and mackerel. Limit how much you eat of the following: Canned or prepackaged foods. Food that is high in trans fat, such as fried foods. Food that is high in saturated fat, such as fatty meat. Sweets, desserts, sugary drinks, and other foods with added sugar. Full-fat dairy products. Do not salt foods before eating. Try to eat at least 2 vegetarian meals each week. Eat more home-cooked food and less restaurant, buffet, and fast food. When eating at a restaurant, ask that your food be prepared with less salt or no salt, if possible. What foods are recommended? The items listed may not be a complete list. Talk with your dietitian about what dietary choices are best for you. Grains Whole-grain or whole-wheat bread. Whole-grain or whole-wheat pasta. Brown rice. [place]. Bulgur. Whole-grain and low-sodium cereals. Pita bread. Low-fat, low-sodium crackers. Whole-wheat flour  tortillas. Vegetables Fresh or frozen vegetables (raw, steamed, roasted, or grilled). Low-sodium or reduced-sodium tomato and vegetable juice. Low-sodium or reduced-sodium tomato sauce and tomato paste. Low-sodium or reduced-sodium canned vegetables. Fruits All fresh, dried, or frozen fruit. Canned fruit in natural juice (without added sugar). Meat and other protein foods Skinless chicken or [place]. Ground chicken or [place]. Pork with fat trimmed off. Fish and seafood. Egg whites. Dried beans, peas, or lentils. Unsalted nuts, nut butters, and seeds. Unsalted canned beans. Lean cuts of beef with fat trimmed off. Low-sodium, lean deli meat. Dairy Low-fat (1%) or fat-free (skim) milk. Fat-free, low-fat, or reduced-fat cheeses. Nonfat, low-sodium ricotta or cottage cheese. Low-fat or nonfat yogurt. Low-fat, low-sodium cheese. Fats and oils Soft margarine without trans fats. Vegetable oil. Low-fat, reduced-fat, or light mayonnaise and salad dressings (reduced-sodium). Canola, safflower, olive, soybean, and sunflower oils. Avocado. Seasoning and other foods Herbs. Spices. Seasoning mixes without salt. Unsalted popcorn and pretzels. Fat-free sweets. What foods are not recommended? The items listed may not be a complete list. Talk with your dietitian about what dietary choices are best for you. Grains Baked  goods made with fat, such as croissants, muffins, or some breads. Dry pasta or rice meal packs. Vegetables Creamed or fried vegetables. Vegetables in a cheese sauce. Regular canned vegetables (not low-sodium or reduced-sodium). Regular canned tomato sauce and paste (not low-sodium or reduced-sodium). Regular tomato and vegetable juice (not low-sodium or reduced-sodium). Angie Fava. Olives. Fruits Canned fruit in a light or heavy syrup. Fried fruit. Fruit in cream or butter sauce. Meat and other protein foods Fatty cuts of meat. Ribs. Fried meat. Berniece Salines. Sausage. Bologna and other processed lunch meats.  Salami. Fatback. Hotdogs. Bratwurst. Salted nuts and seeds. Canned beans with added salt. Canned or smoked fish. Whole eggs or egg yolks. Chicken or [place] with skin. Dairy Whole or 2% milk, cream, and half-and-half. Whole or full-fat cream cheese. Whole-fat or sweetened yogurt. Full-fat cheese. Nondairy creamers. Whipped toppings. Processed cheese and cheese spreads. Fats and oils Butter. Stick margarine. Lard. Shortening. Ghee. Bacon fat. Tropical oils, such as coconut, palm kernel, or palm oil. Seasoning and other foods Salted popcorn and pretzels. Onion salt, garlic salt, seasoned salt, table salt, and sea salt. Worcestershire sauce. Tartar sauce. Barbecue sauce. Teriyaki sauce. Soy sauce, including reduced-sodium. Steak sauce. Canned and packaged gravies. Fish sauce. Oyster sauce. Cocktail sauce. Horseradish that you find on the shelf. Ketchup. Mustard. Meat flavorings and tenderizers. Bouillon cubes. Hot sauce and Tabasco sauce. Premade or packaged marinades. Premade or packaged taco seasonings. Relishes. Regular salad dressings. Where to find more information: National Heart, Lung, and [place]: https://wilson-eaton.com/ American Heart Association: www.heart.org Summary The DASH eating plan is a healthy eating plan that has been shown to reduce high blood pressure (hypertension). It may also reduce your risk for type 2 diabetes, heart disease, and stroke. With the DASH eating plan, you should limit salt (sodium) intake to 2,300 mg a day. If you have hypertension, you may need to reduce your sodium intake to 1,500 mg a day. When on the DASH eating plan, aim to eat more fresh fruits and vegetables, whole grains, lean proteins, low-fat dairy, and heart-healthy fats. Work with your health care provider or diet and nutrition specialist (dietitian) to adjust your eating plan to your individual calorie needs. This information is not intended to replace advice given to you by your health care provider.  Make sure you discuss any questions you have with your health care provider. Document Released: 04/22/2011 Document Revised: 04/26/2016 Document Reviewed: 04/26/2016 Elsevier Interactive Patient Education  2018 [employer].  Fat and Cholesterol Restricted Diet Getting too much fat and cholesterol in your diet may cause health problems. Following this diet helps keep your fat and cholesterol at normal levels. This can keep you from getting sick. What types of fat should I choose?  Choose monosaturated and polyunsaturated fats. These are found in foods such as olive oil, canola oil, flaxseeds, walnuts, almonds, and seeds.  Eat more omega-3 fats. Good choices include salmon, mackerel, sardines, tuna, flaxseed oil, and ground flaxseeds.  Limit saturated fats. These are in animal products such as meats, butter, and cream. They can also be in plant products such as palm oil, palm kernel oil, and coconut oil.  Avoid foods with partially hydrogenated oils in them. These contain trans fats. Examples of foods that have trans fats are stick margarine, some tub margarines, cookies, crackers, and other baked goods. What general guidelines do I need to follow?  Check food labels. Look for the words "trans fat" and "saturated fat."  When preparing a meal: ? Fill half of your plate  with vegetables and green salads. ? Fill one fourth of your plate with whole grains. Look for the word "whole" as the first word in the ingredient list. ? Fill one fourth of your plate with lean protein foods.  Eat more foods that have fiber, like apples, carrots, beans, peas, and barley.  Eat more home-cooked foods. Eat less at restaurants and buffets.  Limit or avoid alcohol.  Limit foods high in starch and sugar.  Limit fried foods.  Cook foods without frying them. Baking, boiling, grilling, and broiling are all great options.  Lose weight if you are overweight. Losing even a small amount of weight can help your  overall health. It can also help prevent diseases such as diabetes and heart disease. What foods can I eat? Grains Whole grains, such as whole wheat or whole grain breads, crackers, cereals, and pasta. Unsweetened oatmeal, bulgur, barley, quinoa, or brown rice. Corn or whole wheat flour tortillas. Vegetables Fresh or frozen vegetables (raw, steamed, roasted, or grilled). Green salads. Fruits All fresh, canned (in natural juice), or frozen fruits. Meat and Other Protein Products Ground beef (85% or leaner), grass-fed beef, or beef trimmed of fat. Skinless chicken or [place]. Ground chicken or [place]. Pork trimmed of fat. All fish and seafood. Eggs. Dried beans, peas, or lentils. Unsalted nuts or seeds. Unsalted canned or dry beans. Dairy Low-fat dairy products, such as skim or 1% milk, 2% or reduced-fat cheeses, low-fat ricotta or cottage cheese, or plain low-fat yogurt. Fats and Oils Tub margarines without trans fats. Light or reduced-fat mayonnaise and salad dressings. Avocado. Olive, canola, sesame, or safflower oils. Natural peanut or almond butter (choose ones without added sugar and oil). The items listed above may not be a complete list of recommended foods or beverages. Contact your dietitian for more options. What foods are not recommended? Grains White bread. White pasta. White rice. Cornbread. Bagels, pastries, and croissants. Crackers that contain trans fat. Vegetables White potatoes. Corn. Creamed or fried vegetables. Vegetables in a cheese sauce. Fruits Dried fruits. Canned fruit in light or heavy syrup. Fruit juice. Meat and Other Protein Products Fatty cuts of meat. Ribs, chicken wings, bacon, sausage, bologna, salami, chitterlings, fatback, hot dogs, bratwurst, and packaged luncheon meats. Liver and organ meats. Dairy Whole or 2% milk, cream, half-and-half, and cream cheese. Whole milk cheeses. Whole-fat or sweetened yogurt. Full-fat cheeses. Nondairy creamers and whipped  toppings. Processed cheese, cheese spreads, or cheese curds. Sweets and Desserts Corn syrup, sugars, honey, and molasses. Candy. Jam and jelly. Syrup. Sweetened cereals. Cookies, pies, cakes, donuts, muffins, and ice cream. Fats and Oils Butter, stick margarine, lard, shortening, ghee, or bacon fat. Coconut, palm kernel, or palm oils. Beverages Alcohol. Sweetened drinks (such as sodas, lemonade, and fruit drinks or punches). The items listed above may not be a complete list of foods and beverages to avoid. Contact your dietitian for more information. This information is not intended to replace advice given to you by your health care provider. Make sure you discuss any questions you have with your health care provider. Document Released: 11/02/2011 Document Revised: 01/08/2016 Document Reviewed: 08/02/2013 Elsevier Interactive Patient Education  2018 [employer].   Carbohydrate Counting for Diabetes Mellitus, Adult Carbohydrate counting is a method for keeping track of how many carbohydrates you eat. Eating carbohydrates naturally increases the amount of sugar (glucose) in the blood. Counting how many carbohydrates you eat helps keep your blood glucose within normal limits, which helps you manage your diabetes (diabetes mellitus). It is important  to know how many carbohydrates you can safely have in each meal. This is different for every person. A diet and nutrition specialist (registered dietitian) can help you make a meal plan and calculate how many carbohydrates you should have at each meal and snack. Carbohydrates are found in the following foods:  Grains, such as breads and cereals.  Dried beans and soy products.  Starchy vegetables, such as potatoes, peas, and corn.  Fruit and fruit juices.  Milk and yogurt.  Sweets and snack foods, such as cake, cookies, candy, chips, and soft drinks.  How do I count carbohydrates? There are two ways to count carbohydrates in food. You can use  either of the methods or a combination of both. Reading "Nutrition Facts" on packaged food The "Nutrition Facts" list is included on the labels of almost all packaged foods and beverages in the [place] It includes:  The serving size.  Information about nutrients in each serving, including the grams (g) of carbohydrate per serving.  To use the "Nutrition Facts":  Decide how many servings you will have.  Multiply the number of servings by the number of carbohydrates per serving.  The resulting number is the total amount of carbohydrates that you will be having.  Learning standard serving sizes of other foods When you eat foods containing carbohydrates that are not packaged or do not include "Nutrition Facts" on the label, you need to measure the servings in order to count the amount of carbohydrates:  Measure the foods that you will eat with a food scale or measuring cup, if needed.  Decide how many standard-size servings you will eat.  Multiply the number of servings by 15. Most carbohydrate-rich foods have about 15 g of carbohydrates per serving. ? For example, if you eat 8 oz (170 g) of strawberries, you will have eaten 2 servings and 30 g of carbohydrates (2 servings x 15 g = 30 g).  For foods that have more than one food mixed, such as soups and casseroles, you must count the carbohydrates in each food that is included.  The following list contains standard serving sizes of common carbohydrate-rich foods. Each of these servings has about 15 g of carbohydrates:   hamburger bun or  English muffin.   oz (15 mL) syrup.   oz (14 g) jelly.  1 slice of bread.  1 six-inch tortilla.  3 oz (85 g) cooked rice or pasta.  4 oz (113 g) cooked dried beans.  4 oz (113 g) starchy vegetable, such as peas, corn, or potatoes.  4 oz (113 g) hot cereal.  4 oz (113 g) mashed potatoes or  of a large baked potato.  4 oz (113 g) canned or frozen fruit.  4 oz (120 mL) fruit  juice.  4-6 crackers.  6 chicken nuggets.  6 oz (170 g) unsweetened dry cereal.  6 oz (170 g) plain fat-free yogurt or yogurt sweetened with artificial sweeteners.  8 oz (240 mL) milk.  8 oz (170 g) fresh fruit or one small piece of fruit.  24 oz (680 g) popped popcorn.  Example of carbohydrate counting Sample meal  3 oz (85 g) chicken breast.  6 oz (170 g) brown rice.  4 oz (113 g) corn.  8 oz (240 mL) milk.  8 oz (170 g) strawberries with sugar-free whipped topping. Carbohydrate calculation 1. Identify the foods that contain carbohydrates: ? Rice. ? Corn. ? Milk. ? Strawberries. 2. Calculate how many servings you have of each food: ?  2 servings rice. ? 1 serving corn. ? 1 serving milk. ? 1 serving strawberries. 3. Multiply each number of servings by 15 g: ? 2 servings rice x 15 g = 30 g. ? 1 serving corn x 15 g = 15 g. ? 1 serving milk x 15 g = 15 g. ? 1 serving strawberries x 15 g = 15 g. 4. Add together all of the amounts to find the total grams of carbohydrates eaten: ? 30 g + 15 g + 15 g + 15 g = 75 g of carbohydrates total. This information is not intended to replace advice given to you by your health care provider. Make sure you discuss any questions you have with your health care provider. Document Released: 05/03/2005 Document Revised: 11/21/2015 Document Reviewed: 10/15/2015 Elsevier Interactive Patient Education  [employer].

## 2017-12-14 NOTE — Progress Notes (Addendum)
Subjective:   Margaret Foley is a 83 y.o. female who presents for Medicare Annual (Subsequent) preventive examination.  Review of Systems:  No ROS.  Medicare Wellness Visit. Additional risk factors are reflected in the social history.  Cardiac Risk Factors include: advanced age (>23men, >36 women);hypertension;dyslipidemia;obesity (BMI >30kg/m2) Sleep patterns: has interrupted sleep, gets up 3-4 times nightly to void and sleeps 6 hours nightly. Patient reports insomnia issues, discussed recommended sleep tips. Relevant patient education assigned to patient using Emmi.  Home Safety/Smoke Alarms: Feels safe in home. Smoke alarms in place.  Living environment; residence and Firearm Safety: 1-story house/ trailer, equipment: Hydrologist, Type: Tub Surveyor, quantity, no firearms. Lives alone, no needs for DME, good support system Seat Belt Safety/Bike Helmet: Wears seat belt.      Objective:     Vitals: BP 134/64   Pulse (!) 53   Resp 17   Ht 5\' 4"  (1.626 m)   Wt 180 lb (81.6 kg)   SpO2 97%   BMI 30.90 kg/m   Body mass index is 30.9 kg/m.  Advanced Directives 12/14/2017 12/02/2016 11/30/2016 10/27/2015 07/21/2015 05/01/2015 04/29/2015  Does Patient Have a Medical Advance Directive? Yes Yes Yes Yes No No No  Type of Paramedic of [place];Living will [place];Living will [place];Living will [place];Living will - - -  Copy of [place] in Chart? No - copy requested No - copy requested - - - - -  Would patient like information on creating a medical advance directive? - - - - - No - patient declined information No - patient declined information    Tobacco Social History   Tobacco Use  Smoking Status Current Some Day Smoker  . Packs/day: 0.10  . Years: 57.00  . Pack years: 5.70  . Types: Cigarettes  Smokeless Tobacco Never Used  Tobacco Comment   Pt has not smoked in 2 wks. pt states  she is "on again off again" not a constant smoker      Ready to quit: Not Answered Counseling given: Not Answered Comment: Pt has not smoked in 2 wks. pt states she is "on again off again" not a constant smoker   Past Medical History:  Diagnosis Date  . Anxiety   . Atrial fibrillation ([place])   . Barrett's esophagus   . CAD (coronary artery disease)   . Cancer [facility])    Pre cancerous Uterine Tumor  . Carotid artery occlusion   . Chest pain    atypical normal myovue 2008  . COPD (chronic obstructive pulmonary disease) ([place])   . Cough   . Depression   . Fibromyalgia   . GERD (gastroesophageal reflux disease)   . Heart murmur    av sclerosis and mild ar echo 2008  . Hip pain   . History of hiatal hernia   . Hyperlipidemia   . Hypertension   . Insomnia, unspecified   . Kidney stone   . Lumbago   . Myalgia and myositis, unspecified    statin intolerant  . 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 Osteoarthritis   . Osteopenia   . Other B-complex deficiencies   . Peripheral artery disease ([place]) 12/02/2016  . Peripheral vascular disease ([place])   . Shortness of breath dyspnea   . Tobacco use disorder   . Unspecified vitamin D deficiency    Past Surgical History:  Procedure Laterality Date  .  ABDOMINAL AORTIC ANEURYSM REPAIR N/A 05/02/2015   Procedure: REPAIR ABDOMINAL AORTIC ANEURYSM  ,AORTA BI-ILIAC;  Surgeon: Serafina Mitchell, MD;  Location: [place];  Service: Vascular;  Laterality: N/A;  . ABDOMINAL HYSTERECTOMY  1986   Complete  . cataract surgery  2011  . COLONOSCOPY    . JOINT REPLACEMENT  2012   Right Shoulder and arm   Family History  Problem Relation Age of Onset  . Arthritis Mother   . Heart disease Mother   . Hypertension Mother   . Heart attack Mother   . Hyperlipidemia Unknown   . Heart disease Son   . Hypertension Son   . Cancer Father   . Hyperlipidemia Sister   . Cancer Brother   . Colon cancer Neg Hx     Social History   Socioeconomic History  . Marital status: Single    Spouse name: Not on file  . Number of children: Not on file  . Years of education: Not on file  . Highest education level: Not on file  Occupational History  . Occupation: retired  Scientific laboratory technician  . Financial resource strain: Not hard at all  . Food insecurity:    Worry: Never true    Inability: Never true  . Transportation needs:    Medical: No    Non-medical: No  Tobacco Use  . Smoking status: Current Some Day Smoker    Packs/day: 0.10    Years: 57.00    Pack years: 5.70    Types: Cigarettes  . Smokeless tobacco: Never Used  . Tobacco comment: Pt has not smoked in 2 wks. pt states she is "on again off again" not a constant smoker   Substance and Sexual Activity  . Alcohol use: No    Alcohol/week: 0.0 oz  . Drug use: No  . Sexual activity: Not Currently  Lifestyle  . Physical activity:    Days per week: 0 days    Minutes per session: 0 min  . Stress: Not at all  Relationships  . Social connections:    Talks on phone: More than three times a week    Gets together: More than three times a week    Attends religious service: More than 4 times per year    Active member of club or organization: Yes    Attends meetings of clubs or organizations: More than 4 times per year    Relationship status: Not on file  Other Topics Concern  . Not on file  Social History Narrative   Regular exercise--yes daily stretching.    Outpatient Encounter Medications as of 12/14/2017  Medication Sig  . Acetaminophen (TYLENOL ARTHRITIS PAIN PO) Take by mouth.  [name] Kitchen amLODipine (NORVASC) 5 MG tablet TAKE 1 TABLET BY MOUTH TWICE DAILY.  [name] Kitchen atenolol (TENORMIN) 25 MG tablet TAKE 1/2 TABLET DAILY.  . calcium-vitamin D (OSCAL WITH D) 500-200 MG-UNIT per tablet Take 1 tablet by mouth daily.    . cyanocobalamin 1000 MCG tablet Take 100 mcg by mouth daily.   [name] Kitchen losartan (COZAAR) 50 MG tablet TAKE 1 TABLET BY MOUTH TWICE DAILY.  .  Multiple Vitamins-Minerals (MULTIVITAMIN WITH MINERALS) tablet Take 1 tablet by mouth daily.   No facility-administered encounter medications on file as of 12/14/2017.     Activities of Daily Living In your present state of health, do you have any difficulty performing the following activities: 12/14/2017  Hearing? N  Vision? N  Difficulty concentrating or making decisions? N  Walking or climbing stairs?  N  Dressing or bathing? N  Doing errands, shopping? N  Preparing Food and eating ? N  Using the Toilet? N  In the past six months, have you accidently leaked urine? Y  Do you have problems with loss of bowel control? N  Managing your Medications? N  Managing your Finances? N  Housekeeping or managing your Housekeeping? Y  Some recent data might be hidden    Patient Care Team: Binnie Rail, MD as PCP - General (Internal Medicine) Garald Balding, MD (Orthopedic Surgery) Josue Hector, MD (Cardiology)    Assessment:   This is a routine wellness examination for Margaret Foley. Physical assessment deferred to PCP.   Exercise Activities and Dietary recommendations Current Exercise Habits: The patient does not participate in regular exercise at present(chair exercises provided), Exercise limited by: orthopedic condition(s)  Diet (meal preparation, eat out, water intake, caffeinated beverages, dairy products, fruits and vegetables): in general, a "healthy" diet  , well balanced   Reviewed heart healthy diet, Encouraged patient to increase daily water and healthy fluid intake. Discussed weight loss strategies. Relevant patient education assigned to patient using Emmi. Diet education was attached to patient's AVS.  Goals    . Patient Stated     Lose weight by increasing physical activity and eating healthy.       Fall Risk Fall Risk  12/14/2017 12/03/2016 12/02/2016 10/07/2015  Falls in the past year? No No No No  Comment - Emmi Telephone Survey: data to providers prior to load - -    Risk for fall due to : - - Impaired balance/gait;Impaired mobility -    Depression Screen PHQ 2/9 Scores 12/14/2017 12/02/2016 10/07/2015  PHQ - 2 Score 1 2 0  PHQ- 9 Score 3 3 -     Cognitive Function MMSE - Mini Mental State Exam 12/14/2017 12/02/2016  Orientation to time 5 5  Orientation to Place 5 5  Registration 3 3  Attention/ Calculation 4 4  Recall 0 2  Language- name 2 objects 2 2  Language- repeat 1 1  Language- follow 3 step command 3 3  Language- read & follow direction 1 1  Write a sentence 1 1  Copy design 1 1  Total score 26 28        Immunization History  Administered Date(s) Administered  . Influenza Whole 04/05/2006, 04/10/2007, 05/19/2009, 03/06/2010  . Influenza, High Dose Seasonal PF 06/18/2016, 03/24/2017  . Pneumococcal Conjugate-13 12/14/2017  . Pneumococcal Polysaccharide-23 02/03/2009   Screening Tests Health Maintenance  Topic Date Due  . TETANUS/TDAP  07/26/1955  . PNA vac Low Risk Adult (2 of 2 - PCV13) 02/03/2010  . INFLUENZA VACCINE  12/15/2017  . DEXA SCAN  Completed      Plan:      Continue doing brain stimulating activities (puzzles, reading, adult coloring books, staying active) to keep memory sharp.  Continue to eat heart healthy diet (full of fruits, vegetables, whole grains, lean protein, water--limit salt, fat, and sugar intake) and increase physical activity as tolerated.  I have personally reviewed and noted the following in the patient's chart:   . Medical and social history . Use of alcohol, tobacco or illicit drugs  . Current medications and supplements . Functional ability and status . Nutritional status . Physical activity . Advanced directives . List of other physicians . Vitals . Screenings to include cognitive, depression, and falls . Referrals and appointments  In addition, I have reviewed and discussed with patient certain preventive protocols,  quality metrics, and best practice recommendations. A written  personalized care plan for preventive services as well as general preventive health recommendations were provided to patient.     Michiel Cowboy, RN  12/14/2017   Medical screening examination/treatment/procedure(s) were performed by non-physician practitioner and as supervising physician I was immediately available for consultation/collaboration. I agree with above. Binnie Rail, MD

## 2018-01-09 ENCOUNTER — Other Ambulatory Visit: Payer: Self-pay | Admitting: Internal Medicine

## 2018-03-01 ENCOUNTER — Ambulatory Visit (INDEPENDENT_AMBULATORY_CARE_PROVIDER_SITE_OTHER): Payer: Medicare Other | Admitting: Physician Assistant

## 2018-03-01 ENCOUNTER — Other Ambulatory Visit (INDEPENDENT_AMBULATORY_CARE_PROVIDER_SITE_OTHER): Payer: Medicare Other

## 2018-03-01 ENCOUNTER — Encounter: Payer: Self-pay | Admitting: Physician Assistant

## 2018-03-01 ENCOUNTER — Telehealth: Payer: Self-pay | Admitting: Physician Assistant

## 2018-03-01 VITALS — BP 108/70 | HR 60 | Ht 64.0 in | Wt 174.3 lb

## 2018-03-01 DIAGNOSIS — K52839 Microscopic colitis, unspecified: Secondary | ICD-10-CM | POA: Diagnosis not present

## 2018-03-01 DIAGNOSIS — K529 Noninfective gastroenteritis and colitis, unspecified: Secondary | ICD-10-CM

## 2018-03-01 DIAGNOSIS — I779 Disorder of arteries and arterioles, unspecified: Secondary | ICD-10-CM

## 2018-03-01 LAB — BASIC METABOLIC PANEL
BUN: 37 mg/dL — ABNORMAL HIGH (ref 6–23)
CHLORIDE: 108 meq/L (ref 96–112)
CO2: 25 meq/L (ref 19–32)
CREATININE: 1.79 mg/dL — AB (ref 0.40–1.20)
Calcium: 9.9 mg/dL (ref 8.4–10.5)
GFR: 28.84 mL/min — ABNORMAL LOW (ref >60.00)
Glucose, Bld: 107 mg/dL — ABNORMAL HIGH (ref 70–99)
POTASSIUM: 4 meq/L (ref 3.5–5.1)
Sodium: 141 mEq/L (ref 135–145)

## 2018-03-01 LAB — CBC WITH DIFFERENTIAL/PLATELET
BASOS PCT: 0.4 % (ref 0.0–3.0)
Basophils Absolute: 0 10*3/uL (ref 0.0–0.1)
EOS ABS: 0 10*3/uL (ref 0.0–0.7)
EOS PCT: 0.2 % (ref 0.0–5.0)
HEMATOCRIT: 39.3 % (ref 36.0–46.0)
Hemoglobin: 13.2 g/dL (ref 12.0–15.0)
Lymphocytes Relative: 27.3 % (ref 12.0–46.0)
Lymphs Abs: 1.9 10*3/uL (ref 0.7–4.0)
MCHC: 33.6 g/dL (ref 30.0–36.0)
MCV: 88.1 fl (ref 78.0–100.0)
MONO ABS: 0.7 10*3/uL (ref 0.1–1.0)
Monocytes Relative: 10.1 % (ref 3.0–12.0)
NEUTROS ABS: 4.4 10*3/uL (ref 1.4–7.7)
Neutrophils Relative %: 62 % (ref 43.0–77.0)
PLATELETS: 205 10*3/uL (ref 150.0–400.0)
RBC: 4.46 Mil/uL (ref 3.87–5.11)
RDW: 14.9 % (ref 11.5–15.5)
WBC: 7.1 10*3/uL (ref 4.0–10.5)

## 2018-03-01 LAB — SEDIMENTATION RATE: Sed Rate: 31 mm/h — ABNORMAL HIGH (ref 0–30)

## 2018-03-01 MED ORDER — PREDNISONE 10 MG PO TABS: ORAL_TABLET | ORAL | 3 refills | Status: DC

## 2018-03-01 NOTE — Telephone Encounter (Signed)
Pt is having a lot of probs with IBS this AM states that she's scared of coming out because it's non stop diarrhea wants to know what to do. Pt is scheduled to see Amy this AM @ 11:00am

## 2018-03-01 NOTE — Progress Notes (Signed)
Subjective:    Patient ID: Margaret Foley, female    DOB: Aug 26, 1934, 83 y.o.   MRN: [id number]  HPI [name] is a pleasant 83 year old white female, known to Dr. Ardis Hughs with previous history of Barrett's.  She was last seen in the office in 2017 when she had endoscopy for follow-up of Barrett's.  She was found to have a small hiatal hernia and no evidence of Barrett's esophagus. She also has history of coronary artery disease, atrial fibrillation, peripheral vascular disease, chronic kidney disease, osteopenia and had an abdominal aortic aneurysm repair in 2016. She comes in today with complaints of diarrhea.  She she has had symptoms that will come and go for many years.  She tries to follow an IBS type diet.  At its worst she will have up to 10 bowel movements per day, and will have to get up at night for diarrhea as well.  She is says all of her stools are loose and mushy and she has not had any bleeding.  She will have episodes of urgent diarrhea, and on bad days she will feel weak and tired if she has had multiple stools.  She has had increase in symptoms over the past couple of weeks but says these are the same symptoms that she has had long-term.  He has not been on any recent antibiotics, no new medications.  She states that she had been treated with prednisone in the past with success.  She has not had any medication for the diarrhea for years.  She had previously used an over-the-counter medication but could not recall the name of it.  Reviewing her records she had colonoscopy in 2005 per Dr. Sharlett Iles for diarrhea and was found to have, left colon diverticulosis, no evidence of colitis or colon polyps.  Random biopsies were taken and showed mild active colitis with focal cryptitis, mild chronic inflammation but no crypt abscesses or granulomas there were a few fragments suggesting thickening of the basement membrane raising possibility of collagenous colitis.  She had also had EGD at that same  time with small bowel biopsies which were benign.  Review of Systems Pertinent positive and negative review of systems were noted in the above HPI section.  All other review of systems was otherwise negative.  Outpatient Encounter Medications as of 03/01/2018  Medication Sig  . Acetaminophen (TYLENOL ARTHRITIS PAIN PO) Take by mouth.  [name] Kitchen amLODipine (NORVASC) 5 MG tablet TAKE 1 TABLET BY MOUTH TWICE DAILY.  [name] Kitchen atenolol (TENORMIN) 25 MG tablet TAKE 1/2 TABLET DAILY.  . calcium-vitamin D (OSCAL WITH D) 500-200 MG-UNIT per tablet Take 1 tablet by mouth daily.    . cyanocobalamin 1000 MCG tablet Take 100 mcg by mouth daily.   [name] Kitchen losartan (COZAAR) 50 MG tablet TAKE 1 TABLET BY MOUTH TWICE DAILY.  . Multiple Vitamins-Minerals (MULTIVITAMIN WITH MINERALS) tablet Take 1 tablet by mouth daily.  . predniSONE (DELTASONE) 10 MG tablet Take 1 tablet by mouth every morning.   No facility-administered encounter medications on file as of 03/01/2018.    Allergies  Allergen Reactions  . Penicillins Rash    Has patient had a PCN reaction causing immediate rash, facial/tongue/throat swelling, SOB or lightheadedness with hypotension: Yes Has patient had a PCN reaction causing severe rash involving mucus membranes or skin necrosis: No Has patient had a PCN reaction that required hospitalization No Has patient had a PCN reaction occurring within the last 10 years: No If all of the above answers are "NO",  then may proceed with Cephalosporin use.   Tori Milks [Naproxen] Other (See Comments)    Caused scar tissue   . Benazepril Hcl     REACTION: cough  . Carvedilol     REACTION: ear buzzing ??  . Indomethacin   . Pravastatin Sodium     REACTION: achy  . Rosuvastatin     REACTION: buzzing  . Simvastatin     REACTION: myalgia  . Tramadol     Ears ringing?  [name] Kitchen Advil [Ibuprofen] Rash   Patient Active Problem List   Diagnosis Date Noted  . Syncope 09/21/2017  . Prediabetes 03/24/2017  . CKD (chronic kidney  disease) 02/23/2016  . Dizziness 11/05/2015  . LBBB (left bundle branch block) 04/16/2015  . AAA (abdominal aortic aneurysm) without rupture ([place]) 04/08/2014  . INSOMNIA, CHRONIC 05/19/2009  . PAROXYSMAL ATRIAL FIBRILLATION 02/04/2009  . CAROTID ARTERY DISEASE 02/04/2009  . MURMUR 02/01/2009  . HIP PAIN 10/28/2008  . Essential hypertension 12/13/2007  . MYALGIA 12/13/2007  . PERIPHERAL VASCULAR DISEASE 04/10/2007  . VITAMIN B12 DEFICIENCY 01/05/2007  . VITAMIN D DEFICIENCY 01/05/2007  . Hyperlipidemia 01/05/2007  . TOBACCO USER 01/05/2007  . CORONARY ARTERY DISEASE 01/05/2007  . 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 Barrett's esophagus 01/05/2007  . Osteoarthritis 01/05/2007  . LOW BACK PAIN 01/05/2007  . OSTEOPENIA 01/05/2007   Social History   Socioeconomic History  . Marital status: Single    Spouse name: Not on file  . Number of children: Not on file  . Years of education: Not on file  . Highest education level: Not on file  Occupational History  . Occupation: retired  Scientific laboratory technician  . Financial resource strain: Not hard at all  . Food insecurity:    Worry: Never true    Inability: Never true  . Transportation needs:    Medical: No    Non-medical: No  Tobacco Use  . Smoking status: Current Some Day Smoker    Packs/day: 0.10    Years: 57.00    Pack years: 5.70    Types: Cigarettes  . Smokeless tobacco: Never Used  . Tobacco comment: Pt has not smoked in 2 wks. pt states she is "on again off again" not a constant smoker   Substance and Sexual Activity  . Alcohol use: No    Alcohol/week: 0.0 standard drinks  . Drug use: No  . Sexual activity: Not Currently  Lifestyle  . Physical activity:    Days per week: 0 days    Minutes per session: 0 min  . Stress: Not at all  Relationships  . Social connections:    Talks on phone: More than three times a week    Gets together: More than three times a week    Attends religious service: More than 4  times per year    Active member of club or organization: Yes    Attends meetings of clubs or organizations: More than 4 times per year    Relationship status: Not on file  . Intimate partner violence:    Fear of current or ex partner: Not on file    Emotionally abused: Not on file    Physically abused: Not on file    Forced sexual activity: Not on file  Other Topics Concern  . Not on file  Social History Narrative   Regular exercise--yes daily stretching.    Ms. Sausedo family history includes Arthritis in her mother; Cancer in her brother and father; Heart attack  in her mother; Heart disease in her mother and son; Hyperlipidemia in her sister and unknown relative; Hypertension in her mother and son.      Objective:    Vitals:   03/01/18 1102  BP: 108/70  Pulse: 60    Physical Exam; well-developed overly white female in no acute distress, pleasant blood pressure 108/70 pulse 60 but not weighed today.  HEENT; nontraumatic normocephalic EOMI PERRLA sclera anicteric oral mucosa moist, Cardiovascular; regular rate and rhythm with S1-S2.  Pulmonary; clear bilaterally, Abdomen; soft, nontender nondistended bowel sounds are active there is no palpable mass or hepatosplenomegaly, Rectal exam not done, Extremities; no clubbing cyanosis or edema skin warm dry, Neuro psych; alert and oriented, grossly nonfocal mood and affect appropriate       Assessment & Plan:   #69 83 year old white female with long-standing chronic diarrhea with intermittent exacerbations very Patient has had increase in symptoms over the past couple of weeks with up to 10 bowel movements per day. She recalls being treated with prednisone in the past with success.  Previous random biopsies from 2005 were suggestive of collagenous colitis.  I think her symptoms are unlikely infectious as this is been her usual pattern long-term.  #2 prior history of Barrett's esophagus, no evidence of Barrett's on most recent EGD  2017 #3.  Coronary artery disease #4.  History of atrial fibrillation #5.  Chronic kidney disease #6.  Osteopenia #7.  Status post abdominal aortic aneurysm repair  Plan; I discussed a trial of budesonide with the patient, and explained that cost is frequently a factor.  This can be compounded through a pharmacy in [place] for about $100 per month.  She states that will be prohibitively expensive  Will give her a trial of prednisone 10 mg p.o. every morning over the next month area Also start Imodium on a as needed basis and suggested she may want to take 1 at bedtime since she seems to be having frequent nocturnal episodes.  Check CBC with differential, be met and sed rate.  She usually follows a low lactose diet and uses lactose milk, not on any artificial sweeteners, no other known food intolerances.  She will follow-up in the office with me in about 1 month and knows to call for problems in the interim.  Amy S Esterwood PA-C 03/01/2018   Cc: Binnie Rail, MD

## 2018-03-01 NOTE — Telephone Encounter (Signed)
Patient arrived for her appointment.

## 2018-03-01 NOTE — Patient Instructions (Addendum)
Your provider has requested that you go to the basement level for lab work before leaving today. Press "B" on the elevator. The lab is located at the first door on the left as you exit the elevator. We sent a prescription for Prednisone 10 mg tablets to [facility], [place].  Take Imodium as needed for diarrhea up to 4 tablets a day.   Follow up with Nicoletta Ba PA or Dr. Owens Loffler as needed.  Normal BMI (Body Mass Index- based on height and weight) is between 23 and 30. Your BMI today is Body mass index is 29.92 kg/m. [name] Kitchen Please consider follow up  regarding your BMI with your Primary Care Provider.

## 2018-03-01 NOTE — Progress Notes (Signed)
I agree with the above ntoe, plan

## 2018-03-23 NOTE — Patient Instructions (Addendum)
  Flu immunization administered today.    Medications reviewed and updated.  Changes include :   Stopping losartan.   Start hydralazine 10 mg three times a day.    Your prescription(s) have been submitted to your pharmacy. Please take as directed and contact our office if you believe you are having problem(s) with the medication(s).   Monitor your BP at home.    Please followup in 2 weeks

## 2018-03-23 NOTE — Progress Notes (Signed)
Subjective:    Patient ID: Margaret Foley, female    DOB: 1934/09/16, 83 y.o.   MRN: [id number]  HPI The patient is here for follow up.  Hypertension: She is taking her medication daily. She is compliant with a low sodium diet.  She denies chest pain, palpitations, edema, shortness of breath and regular headaches. She is not exercising regularly.  She does not monitor her blood pressure at home.    CKD:  She does not take nsaids.  She drinks water throughout the day.   Prediabetes:  She is not compliant with a low sugar/carbohydrate diet.  She is not exercising regularly.  Tobacco abuse:  She is still smoking and does not have any desire to quit.    Medications and allergies reviewed with patient and updated if appropriate.  Patient Active Problem List   Diagnosis Date Noted  . Syncope 09/21/2017  . Prediabetes 03/24/2017  . CKD (chronic kidney disease) 02/23/2016  . Dizziness 11/05/2015  . LBBB (left bundle branch block) 04/16/2015  . AAA (abdominal aortic aneurysm) without rupture ([place]) 04/08/2014  . INSOMNIA, CHRONIC 05/19/2009  . PAROXYSMAL ATRIAL FIBRILLATION 02/04/2009  . CAROTID ARTERY DISEASE 02/04/2009  . MURMUR 02/01/2009  . HIP PAIN 10/28/2008  . Essential hypertension 12/13/2007  . PERIPHERAL VASCULAR DISEASE 04/10/2007  . VITAMIN B12 DEFICIENCY 01/05/2007  . VITAMIN D DEFICIENCY 01/05/2007  . Hyperlipidemia 01/05/2007  . Tobacco abuse 01/05/2007  . CORONARY ARTERY DISEASE 01/05/2007  . 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 Barrett's esophagus 01/05/2007  . Osteoarthritis 01/05/2007  . LOW BACK PAIN 01/05/2007  . OSTEOPENIA 01/05/2007    Current Outpatient Medications on File Prior to Visit  Medication Sig Dispense Refill  . Acetaminophen (TYLENOL ARTHRITIS PAIN PO) Take by mouth.    [name] Kitchen amLODipine (NORVASC) 5 MG tablet TAKE 1 TABLET BY MOUTH TWICE DAILY. 180 tablet 0  . atenolol (TENORMIN) 25 MG tablet TAKE 1/2 TABLET DAILY. 45 tablet  1  . calcium-vitamin D (OSCAL WITH D) 500-200 MG-UNIT per tablet Take 1 tablet by mouth daily.      . cyanocobalamin 1000 MCG tablet Take 100 mcg by mouth daily.     [name] Kitchen losartan (COZAAR) 50 MG tablet TAKE 1 TABLET BY MOUTH TWICE DAILY. 180 tablet 1  . Multiple Vitamins-Minerals (MULTIVITAMIN WITH MINERALS) tablet Take 1 tablet by mouth daily.    . predniSONE (DELTASONE) 10 MG tablet Take 1 tablet by mouth every morning. 30 tablet 3   No current facility-administered medications on file prior to visit.     Past Medical History:  Diagnosis Date  . Anxiety   . Atrial fibrillation ([place])   . Barrett's esophagus   . CAD (coronary artery disease)   . Cancer [facility])    Pre cancerous Uterine Tumor  . Carotid artery occlusion   . Chest pain    atypical normal myovue 2008  . COPD (chronic obstructive pulmonary disease) ([place])   . Cough   . Depression   . Fibromyalgia   . GERD (gastroesophageal reflux disease)   . Heart murmur    av sclerosis and mild ar echo 2008  . Hip pain   . History of hiatal hernia   . Hyperlipidemia   . Hypertension   . Insomnia, unspecified   . Kidney stone   . Lumbago   . Myalgia and myositis, unspecified    statin intolerant  . 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 Osteoarthritis   .  Osteopenia   . Other B-complex deficiencies   . Peripheral artery disease ([place]) 12/02/2016  . Peripheral vascular disease ([place])   . Shortness of breath dyspnea   . Tobacco use disorder   . Unspecified vitamin D deficiency     Past Surgical History:  Procedure Laterality Date  . ABDOMINAL AORTIC ANEURYSM REPAIR N/A 05/02/2015   Procedure: REPAIR ABDOMINAL AORTIC ANEURYSM  ,AORTA BI-ILIAC;  Surgeon: Serafina Mitchell, MD;  Location: [place];  Service: Vascular;  Laterality: N/A;  . ABDOMINAL HYSTERECTOMY  1986   Complete  . cataract surgery  2011  . COLONOSCOPY    . JOINT REPLACEMENT  2012   Right Shoulder and arm    Social  History   Socioeconomic History  . Marital status: Single    Spouse name: Not on file  . Number of children: Not on file  . Years of education: Not on file  . Highest education level: Not on file  Occupational History  . Occupation: retired  Scientific laboratory technician  . Financial resource strain: Not hard at all  . Food insecurity:    Worry: Never true    Inability: Never true  . Transportation needs:    Medical: No    Non-medical: No  Tobacco Use  . Smoking status: Current Some Day Smoker    Packs/day: 0.10    Years: 57.00    Pack years: 5.70    Types: Cigarettes  . Smokeless tobacco: Never Used  . Tobacco comment: Pt has not smoked in 2 wks. pt states she is "on again off again" not a constant smoker   Substance and Sexual Activity  . Alcohol use: No    Alcohol/week: 0.0 standard drinks  . Drug use: No  . Sexual activity: Not Currently  Lifestyle  . Physical activity:    Days per week: 0 days    Minutes per session: 0 min  . Stress: Not at all  Relationships  . Social connections:    Talks on phone: More than three times a week    Gets together: More than three times a week    Attends religious service: More than 4 times per year    Active member of club or organization: Yes    Attends meetings of clubs or organizations: More than 4 times per year    Relationship status: Not on file  Other Topics Concern  . Not on file  Social History Narrative   Regular exercise--yes daily stretching.    Family History  Problem Relation Age of Onset  . Arthritis Mother   . Heart disease Mother   . Hypertension Mother   . Heart attack Mother   . Hyperlipidemia Unknown   . Heart disease Son   . Hypertension Son   . Cancer Father   . Hyperlipidemia Sister   . Cancer Brother   . Colon cancer Neg Hx     Review of Systems  Constitutional: Negative for chills and fever.  Respiratory: Negative for cough, shortness of breath and wheezing.   Cardiovascular: Positive for leg swelling  (occ if on feet for long period of time, resolves quickly). Negative for chest pain and palpitations.  Musculoskeletal: Positive for gait problem.  Neurological: Positive for light-headedness. Negative for headaches.       Objective:   Vitals:   03/24/18 1341  BP: 130/64  Pulse: 61  Resp: 16  Temp: 97.9 F (36.6 C)  SpO2: 97%   BP Readings from Last 3 Encounters:  03/24/18  130/64  03/01/18 108/70  12/14/17 134/64   Wt Readings from Last 3 Encounters:  03/24/18 178 lb (80.7 kg)  03/01/18 174 lb 5 oz (79.1 kg)  12/14/17 180 lb (81.6 kg)   Body mass index is 30.55 kg/m.   Physical Exam    Constitutional: Appears well-developed and well-nourished. No distress.  HENT:  Head: Normocephalic and atraumatic.  Neck: Neck supple. No tracheal deviation present. No thyromegaly present.  No cervical lymphadenopathy Cardiovascular: Normal rate, regular rhythm and normal heart sounds.   No murmur heard. No carotid bruit .  No edema Pulmonary/Chest: Effort normal and breath sounds normal. No respiratory distress. No has no wheezes. No rales.  Skin: Skin is warm and dry. Not diaphoretic.  Psychiatric: Normal mood and affect. Behavior is normal.      Assessment & Plan:    See Problem List for Assessment and Plan of chronic medical problems.

## 2018-03-24 ENCOUNTER — Encounter: Payer: Self-pay | Admitting: Internal Medicine

## 2018-03-24 ENCOUNTER — Ambulatory Visit (INDEPENDENT_AMBULATORY_CARE_PROVIDER_SITE_OTHER): Payer: Medicare Other | Admitting: Internal Medicine

## 2018-03-24 VITALS — BP 130/64 | HR 61 | Temp 97.9°F | Resp 16 | Ht 64.0 in | Wt 178.0 lb

## 2018-03-24 DIAGNOSIS — Z23 Encounter for immunization: Secondary | ICD-10-CM | POA: Diagnosis not present

## 2018-03-24 DIAGNOSIS — N189 Chronic kidney disease, unspecified: Secondary | ICD-10-CM | POA: Diagnosis not present

## 2018-03-24 DIAGNOSIS — K52839 Microscopic colitis, unspecified: Secondary | ICD-10-CM

## 2018-03-24 DIAGNOSIS — Z720 Tobacco use: Secondary | ICD-10-CM | POA: Diagnosis not present

## 2018-03-24 DIAGNOSIS — F17210 Nicotine dependence, cigarettes, uncomplicated: Secondary | ICD-10-CM | POA: Diagnosis not present

## 2018-03-24 DIAGNOSIS — R7303 Prediabetes: Secondary | ICD-10-CM

## 2018-03-24 DIAGNOSIS — I779 Disorder of arteries and arterioles, unspecified: Secondary | ICD-10-CM

## 2018-03-24 DIAGNOSIS — I10 Essential (primary) hypertension: Secondary | ICD-10-CM | POA: Diagnosis not present

## 2018-03-24 MED ORDER — HYDRALAZINE HCL 10 MG PO TABS: 10.0000 mg | ORAL_TABLET | Freq: Three times a day (TID) | ORAL | 5 refills | Status: DC

## 2018-03-24 NOTE — Assessment & Plan Note (Signed)
Following with GI Taking prednisone 10 mg daily which has helped

## 2018-03-25 NOTE — Assessment & Plan Note (Signed)
Blood pressure is good here today, but kidney function is worse Discontinue losartan Start hydralazine 10 mg 3 times daily Continue all atenolol and amlodipine at current doses Follow-up in 2 weeks Encouraged her to monitor her blood pressure at home

## 2018-03-25 NOTE — Assessment & Plan Note (Signed)
Smoking cessation was discussed for more than 3 minutes.  The patient was counseled on the dangers of tobacco use, and was advised to quit.  Reviewed ways of quitting smoking including nicotine replacement, vapping/e-cigarettes, cold [place], weaning off cigarettes, and pharmacotherapy (wellbutrin and chantix).  She knows she should quit, but is not 100% committed to quitting.

## 2018-03-25 NOTE — Assessment & Plan Note (Signed)
Recent kidney function is worse so we will discontinue her losartan and start hydralazine for blood pressure instead Continue Tylenol and amlodipine She does drink water throughout the day and does not take any NSAIDs We will recheck BMP in 2 weeks

## 2018-03-25 NOTE — Assessment & Plan Note (Signed)
Encouraged regular exercise Will recheck A1c in 2 weeks Discussed she needs to be more compliant with a diabetic diet since she is on prednisone-discussed this will elevate her sugars we will need to monitor closely

## 2018-03-27 ENCOUNTER — Telehealth: Payer: Self-pay | Admitting: Internal Medicine

## 2018-03-27 NOTE — Telephone Encounter (Signed)
MD is out of the office will hold until she return tomorrow.[name] KitchenJohny Chess

## 2018-03-27 NOTE — Telephone Encounter (Signed)
Copied from [place] [id number]. Topic: Quick Communication - See Telephone Encounter >> Mar 27, 2018  8:59 AM Rayann Heman wrote: CRM for notification. See Telephone encounter for: 03/27/18. Pt called and stated that Radene Journey Physical therapist would need permission from stacy burns to see pt. [facility] [phone]. Please advise

## 2018-03-28 NOTE — Telephone Encounter (Signed)
Faxed over verbal orders per Dr. [name] stating it was ok for PT to see pt.

## 2018-04-07 ENCOUNTER — Ambulatory Visit: Payer: Medicare Other | Admitting: Internal Medicine

## 2018-04-14 ENCOUNTER — Other Ambulatory Visit: Payer: Self-pay | Admitting: Internal Medicine

## 2018-05-18 ENCOUNTER — Other Ambulatory Visit: Payer: Self-pay

## 2018-05-18 DIAGNOSIS — F17200 Nicotine dependence, unspecified, uncomplicated: Secondary | ICD-10-CM

## 2018-05-18 DIAGNOSIS — I779 Disorder of arteries and arterioles, unspecified: Secondary | ICD-10-CM

## 2018-05-30 ENCOUNTER — Encounter: Payer: Self-pay | Admitting: Family

## 2018-05-30 ENCOUNTER — Encounter (HOSPITAL_COMMUNITY): Payer: Medicare Other

## 2018-05-30 ENCOUNTER — Ambulatory Visit: Payer: Medicare Other | Admitting: Family

## 2018-06-27 ENCOUNTER — Other Ambulatory Visit: Payer: Self-pay

## 2018-06-27 ENCOUNTER — Ambulatory Visit (INDEPENDENT_AMBULATORY_CARE_PROVIDER_SITE_OTHER): Payer: Medicare Other | Admitting: Physician Assistant

## 2018-06-27 ENCOUNTER — Encounter: Payer: Self-pay | Admitting: Family

## 2018-06-27 ENCOUNTER — Ambulatory Visit (HOSPITAL_COMMUNITY)
Admission: RE | Admit: 2018-06-27 | Discharge: 2018-06-27 | Disposition: A | Discharge status: Home / Self Care | Payer: Medicare Other | Source: Ambulatory Visit | Attending: Surgery | Admitting: Surgery

## 2018-06-27 VITALS — BP 171/85 | HR 78 | Temp 97.7°F | Resp 14 | Ht 66.0 in | Wt 177.0 lb

## 2018-06-27 DIAGNOSIS — F17200 Nicotine dependence, unspecified, uncomplicated: Secondary | ICD-10-CM | POA: Insufficient documentation

## 2018-06-27 DIAGNOSIS — I779 Disorder of arteries and arterioles, unspecified: Secondary | ICD-10-CM

## 2018-06-27 DIAGNOSIS — I714 Abdominal aortic aneurysm, without rupture, unspecified: Secondary | ICD-10-CM

## 2018-06-27 DIAGNOSIS — I739 Peripheral vascular disease, unspecified: Secondary | ICD-10-CM

## 2018-06-27 NOTE — Progress Notes (Signed)
Established Abdominal Aortic Aneurysm   History of Present Illness   KAISYN REINHOLD is a 84 y.o. (September 05, 1934) female who presents with chief complaint: follow up for AAA.  She is status post open AAA repair with reimplantation of IMA to left limb of aorto by iliac graft by Dr. Trula Slade 05/02/2015.  She denies any new or changing abdominal or back pain.  She is also followed for PAD.  She admittedly has not been walking much as she has noticed slow to progress balance issues as well as some short-term memory issues over the past year.  She denies any rest pain or active tissue ischemia of bilateral lower extremities.  She does have known lumbar spine arthritis which also impacts her walking.  She denies tobacco use.    The patient's PMH, PSH, SH, and FamHx were reviewed and are unchanged from prior visit.  Current Outpatient Medications  Medication Sig Dispense Refill  . Acetaminophen (TYLENOL ARTHRITIS PAIN PO) Take by mouth.    [name] Kitchen amLODipine (NORVASC) 5 MG tablet TAKE 1 TABLET BY MOUTH TWICE DAILY. 180 tablet 0  . atenolol (TENORMIN) 25 MG tablet TAKE 1/2 TABLET DAILY. 45 tablet 1  . calcium-vitamin D (OSCAL WITH D) 500-200 MG-UNIT per tablet Take 1 tablet by mouth daily.      . cyanocobalamin 1000 MCG tablet Take 100 mcg by mouth daily.     . hydrALAZINE (APRESOLINE) 10 MG tablet Take 1 tablet (10 mg total) by mouth 3 (three) times daily. 90 tablet 5  . Multiple Vitamins-Minerals (MULTIVITAMIN WITH MINERALS) tablet Take 1 tablet by mouth daily.    . predniSONE (DELTASONE) 10 MG tablet Take 1 tablet by mouth every morning. 30 tablet 3   No current facility-administered medications for this visit.     On ROS today: 10 system ROS otherwise negative   Physical Examination   Vitals:   06/27/18 1336  BP: (!) 171/85  Pulse: 78  Resp: 14  Temp: 97.7 F (36.5 C)  TempSrc: Oral  SpO2: 96%  Weight: 177 lb (80.3 kg)  Height: 5\' 6"  (1.676 m)   Body mass index is 28.57  kg/m.  General Alert, O x 3, WD, NAD  Pulmonary Sym exp, good B air movt  Cardiac RRR, Nl S1, S2  Vascular Vessel Right Left  Radial Palpable Palpable  Aorta Not palpable N/A  Femoral Palpable Palpable  Popliteal Not palpable Not palpable  PT Not palpable Not palpable  DP Not palpable Not palpable    Gastro- intestinal soft, non-distended, non-tender to palpation, Surgical incisions well healed  Musculo- skeletal M/S 5/5 throughout  , Extremities without ischemic changes   Neurologic A&O; CN grossly intact     Non-Invasive Vascular Imaging    ABI  ABI/TBIToday's ABIToday's TBIPrevious ABIPrevious TBI +-------+-----------+-----------+------------+------------+ Right  0.68       0.46       0.62        0.53         +-------+-----------+-----------+------------+------------+ Left   0.73       0.29       0.64        0.23      Medical Decision Making   DEMONICA FARREY is a 84 y.o. ([birth year]) female who presents for routine ABI check status post open AAA repair 04/2015  -ABIs are unchanged over the past 6 months -No rest pain or active tissue ischemia of bilateral lower extremities -Encouraged her to follow-up with PCP if short-term memory and  balance issues limit her day-to-day life -We will check ABIs as well as anastomoses of aortobiiliac graft in 1 year -She will return to office sooner if rest pain or tissue changes develop bilateral lower extremities  Dagoberto Ligas PA-C [facility] Office: [phone]  Clinic MD: Dr. Donnetta Hutching

## 2018-09-18 ENCOUNTER — Other Ambulatory Visit: Payer: Self-pay | Admitting: Internal Medicine

## 2019-02-01 NOTE — Progress Notes (Addendum)
Subjective:   Margaret Foley is a 84 y.o. female who presents for Medicare Annual (Subsequent) preventive examination. I connected with patient by a telephone and verified that I am speaking with the correct person using two identifiers. Patient stated full name and DOB. Patient gave permission to continue with telephonic visit. Patient's location was at home and Nurse's location was at [place] office.  Review of Systems:   Cardiac Risk Factors include: advanced age (>35men, >64 women);dyslipidemia;hypertension Sleep patterns: gets up 2 times nightly to void and sleeps 6-7 hours nightly.    Home Safety/Smoke Alarms: Feels safe in home. Smoke alarms in place.  Living environment; residence and Firearm Safety: 1-story house/ trailer, equipment: Hydrologist, Type: Tub Surveyor, quantity. Lives alone, no needs for DME, good support system Seat Belt Safety/Bike Helmet: Wears seat belt.    Objective:     Vitals: There were no vitals taken for this visit.  There is no height or weight on file to calculate BMI.  Advanced Directives 02/02/2019 12/14/2017 12/02/2016 11/30/2016 10/27/2015 07/21/2015 05/01/2015  Does Patient Have a Medical Advance Directive? Yes Yes Yes Yes Yes No No  Type of Paramedic of [place];Living will [place];Living will [place];Living will [place];Living will [place];Living will - -  Copy of [place] in Chart? No - copy requested No - copy requested No - copy requested - - - -  Would patient like information on creating a medical advance directive? - - - - - - No - patient declined information    Tobacco Social History   Tobacco Use  Smoking Status Current Some Day Smoker  . Packs/day: 0.25  . Years: 57.00  . Pack years: 14.25  . Types: Cigarettes  Smokeless Tobacco Never Used     Ready to quit: No Counseling given: No  Past Medical History:   Diagnosis Date  . Anxiety   . Atrial fibrillation ([place])   . Barrett's esophagus   . CAD (coronary artery disease)   . Cancer [facility])    Pre cancerous Uterine Tumor  . Carotid artery occlusion   . Chest pain    atypical normal myovue 2008  . COPD (chronic obstructive pulmonary disease) ([place])   . Cough   . Depression   . Fibromyalgia   . GERD (gastroesophageal reflux disease)   . Heart murmur    av sclerosis and mild ar echo 2008  . Hip pain   . History of hiatal hernia   . Hyperlipidemia   . Hypertension   . Insomnia, unspecified   . Kidney stone   . Lumbago   . Myalgia and myositis, unspecified    statin intolerant  . 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 Osteoarthritis   . Osteopenia   . Other B-complex deficiencies   . Peripheral artery disease ([place]) 12/02/2016  . Peripheral vascular disease ([place])   . Shortness of breath dyspnea   . Tobacco use disorder   . Unspecified vitamin D deficiency    Past Surgical History:  Procedure Laterality Date  . ABDOMINAL AORTIC ANEURYSM REPAIR N/A 05/02/2015   Procedure: REPAIR ABDOMINAL AORTIC ANEURYSM  ,AORTA BI-ILIAC;  Surgeon: Serafina Mitchell, MD;  Location: [place];  Service: Vascular;  Laterality: N/A;  . ABDOMINAL HYSTERECTOMY  1986   Complete  . cataract surgery  2011  . COLONOSCOPY    . JOINT REPLACEMENT  2012  Right Shoulder and arm   Family History  Problem Relation Age of Onset  . Arthritis Mother   . Heart disease Mother   . Hypertension Mother   . Heart attack Mother   . Hyperlipidemia Other   . Heart disease Son   . Hypertension Son   . Cancer Father   . Hyperlipidemia Sister   . Cancer Brother   . Colon cancer Neg Hx    Social History   Socioeconomic History  . Marital status: Single    Spouse name: Not on file  . Number of children: 2  . Years of education: Not on file  . Highest education level: Not on file  Occupational History  . Occupation:  retired  Scientific laboratory technician  . Financial resource strain: Not hard at all  . Food insecurity    Worry: Never true    Inability: Never true  . Transportation needs    Medical: No    Non-medical: No  Tobacco Use  . Smoking status: Current Some Day Smoker    Packs/day: 0.25    Years: 57.00    Pack years: 14.25    Types: Cigarettes  . Smokeless tobacco: Never Used  Substance and Sexual Activity  . Alcohol use: No    Alcohol/week: 0.0 standard drinks  . Drug use: No  . Sexual activity: Not Currently  Lifestyle  . Physical activity    Days per week: 0 days    Minutes per session: 0 min  . Stress: Not at all  Relationships  . Social connections    Talks on phone: More than three times a week    Gets together: More than three times a week    Attends religious service: More than 4 times per year    Active member of club or organization: Yes    Attends meetings of clubs or organizations: More than 4 times per year    Relationship status: Not on file  Other Topics Concern  . Not on file  Social History Narrative   Regular exercise--yes daily stretching.    Outpatient Encounter Medications as of 02/02/2019  Medication Sig  . Acetaminophen (TYLENOL ARTHRITIS PAIN PO) Take by mouth.  [name] Kitchen amLODipine (NORVASC) 5 MG tablet Take 1 tablet (5 mg total) by mouth 2 (two) times daily.  [name] Kitchen atenolol (TENORMIN) 25 MG tablet Take 0.5 tablets (12.5 mg total) by mouth daily.  . calcium-vitamin D (OSCAL WITH D) 500-200 MG-UNIT per tablet Take 1 tablet by mouth daily.    . cyanocobalamin 1000 MCG tablet Take 100 mcg by mouth daily.   . hydrALAZINE (APRESOLINE) 10 MG tablet Take 1 tablet (10 mg total) by mouth 3 (three) times daily.  . Multiple Vitamins-Minerals (MULTIVITAMIN WITH MINERALS) tablet Take 1 tablet by mouth daily.  . [DISCONTINUED] amLODipine (NORVASC) 5 MG tablet TAKE 1 TABLET BY MOUTH TWICE DAILY.  . [DISCONTINUED] atenolol (TENORMIN) 25 MG tablet TAKE 1/2 TABLET DAILY.  . [DISCONTINUED]  hydrALAZINE (APRESOLINE) 10 MG tablet Take 1 tablet (10 mg total) by mouth 3 (three) times daily.  . [DISCONTINUED] predniSONE (DELTASONE) 10 MG tablet Take 1 tablet by mouth every morning. (Patient not taking: Reported on 02/02/2019)   No facility-administered encounter medications on file as of 02/02/2019.     Activities of Daily Living In your present state of health, do you have any difficulty performing the following activities: 02/02/2019  Hearing? N  Vision? N  Difficulty concentrating or making decisions? N  Walking or climbing stairs? N  Dressing or bathing? N  Doing errands, shopping? N  Preparing Food and eating ? N  Using the Toilet? N  In the past six months, have you accidently leaked urine? N  Do you have problems with loss of bowel control? N  Managing your Medications? N  Managing your Finances? N  Housekeeping or managing your Housekeeping? N  Some recent data might be hidden    Patient Care Team: Binnie Rail, MD as PCP - General (Internal Medicine) Garald Balding, MD (Orthopedic Surgery) Josue Hector, MD (Cardiology)    Assessment:   This is a routine wellness examination for Valeda. Physical assessment deferred to PCP.  Exercise Activities and Dietary recommendations Current Exercise Habits: The patient does not participate in regular exercise at present, Exercise limited by: orthopedic condition(s)(states she stay active doing light yard work and daily house-work)  Diet (meal preparation, eat out, water intake, caffeinated beverages, dairy products, fruits and vegetables): in general, a "healthy" diet  , well balanced   Reviewed heart healthy and diabetic diet. Encouraged patient to increase daily water and healthy fluid intake.  Goals    . Be as active and independent for as long as possible     Continue to be active, spend time with family, and eat as healthy as I can.    . Patient Stated     Lose weight by increasing physical activity and  eating healthy.       Fall Risk Fall Risk  02/02/2019 12/14/2017 12/03/2016 12/02/2016 10/07/2015  Falls in the past year? 0 No No No No  Comment - - Emmi Telephone Survey: data to providers prior to load - -  Number falls in past yr: 0 - - - -  Injury with Fall? 0 - - - -  Risk for fall due to : Impaired balance/gait - - Impaired balance/gait;Impaired mobility -  Follow up Falls prevention discussed - - - -   Is the patient's home free of loose throw rugs in walkways, pet beds, electrical cords, etc?   yes      Grab bars in the bathroom? yes      Handrails on the stairs?   yes      Adequate lighting?   yes   Depression Screen PHQ 2/9 Scores 02/02/2019 12/14/2017 12/02/2016 10/07/2015  PHQ - 2 Score 1 1 2  0  PHQ- 9 Score 2 3 3  -     Cognitive Function MMSE - Mini Mental State Exam 12/14/2017 12/02/2016  Orientation to time 5 5  Orientation to Place 5 5  Registration 3 3  Attention/ Calculation 4 4  Recall 0 2  Language- name 2 objects 2 2  Language- repeat 1 1  Language- follow 3 step command 3 3  Language- read & follow direction 1 1  Write a sentence 1 1  Copy design 1 1  Total score 26 28     6CIT Screen 02/02/2019  What Year? 0 points  What month? 0 points  What time? 0 points  Count back from 20 0 points  Months in reverse 0 points  Repeat phrase 2 points  Total Score 2    Immunization History  Administered Date(s) Administered  . Influenza Whole 04/05/2006, 04/10/2007, 05/19/2009, 03/06/2010  . Influenza, High Dose Seasonal PF 06/18/2016, 03/24/2017, 03/24/2018  . Pneumococcal Conjugate-13 12/14/2017  . Pneumococcal Polysaccharide-23 02/03/2009   Screening Tests Health Maintenance  Topic Date Due  . TETANUS/TDAP  07/26/1953  . DEXA SCAN  09/21/2014  .  INFLUENZA VACCINE  12/16/2018  . PNA vac Low Risk Adult  Completed      Plan:     Refilled patient's B/P medications and scheduled an office visit with PCP 03/27/19 to refill medications and discuss  incontinence.   Reviewed health maintenance screenings with patient today and relevant education, vaccines, and/or referrals were provided.   Continue to eat heart healthy diet (full of fruits, vegetables, whole grains, lean protein, water--limit salt, fat, and sugar intake) and increase physical activity as tolerated.  Continue doing brain stimulating activities (puzzles, reading, adult coloring books, staying active) to keep memory sharp.   I have personally reviewed and noted the following in the patient's chart:   . Medical and social history . Use of alcohol, tobacco or illicit drugs  . Current medications and supplements . Functional ability and status . Nutritional status . Physical activity . Advanced directives . List of other physicians . Screenings to include cognitive, depression, and falls . Referrals and appointments  In addition, I have reviewed and discussed with patient certain preventive protocols, quality metrics, and best practice recommendations. A written personalized care plan for preventive services as well as general preventive health recommendations were provided to patient.     Michiel Cowboy, RN  02/02/2019    Medical screening examination/treatment/procedure(s) were performed by non-physician practitioner and as supervising physician I was immediately available for consultation/collaboration. I agree with above. Binnie Rail, MD

## 2019-02-02 ENCOUNTER — Ambulatory Visit (INDEPENDENT_AMBULATORY_CARE_PROVIDER_SITE_OTHER): Payer: Medicare Other | Admitting: *Deleted

## 2019-02-02 ENCOUNTER — Ambulatory Visit: Payer: Medicare Other

## 2019-02-02 DIAGNOSIS — Z0000 Encounter for general adult medical examination without abnormal findings: Secondary | ICD-10-CM | POA: Diagnosis not present

## 2019-02-02 MED ORDER — AMLODIPINE BESYLATE 5 MG PO TABS: 5.0000 mg | ORAL_TABLET | Freq: Two times a day (BID) | ORAL | 0 refills | Status: DC

## 2019-02-02 MED ORDER — HYDRALAZINE HCL 10 MG PO TABS: 10.0000 mg | ORAL_TABLET | Freq: Three times a day (TID) | ORAL | 5 refills | Status: DC

## 2019-02-02 MED ORDER — ATENOLOL 25 MG PO TABS: 12.5000 mg | ORAL_TABLET | Freq: Every day | ORAL | 1 refills | Status: DC

## 2019-03-26 NOTE — Patient Instructions (Addendum)
  Tests ordered today. Your results will be released to [place] (or called to you) after review.  If any changes need to be made, you will be notified at that same time.  Flu immunization administered today.   A bone density was ordered.   Medications reviewed and updated.  Changes include :   none   A referral was ordered for urology.   Please followup in 6 months

## 2019-03-26 NOTE — Progress Notes (Signed)
Subjective:    Patient ID: Margaret Foley, female    DOB: [birth year], 84 y.o.   MRN: [id number]  HPI The patient is here for follow up.  She is not exercising regularly.     Urinary frequency:   She urinates several times at night.  She gets up 4-5 times a night and has difficulty getting back to sleep. She does have incontinence.  She had a bladder surgery several years ago and wonders if she needs that again. She is trying to drink a lot of fluids as I advised for her kidneys.   Tinnitus:  She has a slight ringing or buzzing sound.  She has noise in her head and it is intermittent.   Can be very annoying and sometimes it is just there.  She does have hearing loss.   Hypertension: She is taking her medication daily. She is compliant with a low sodium diet.  She denies chest pain, palpitations, edema, shortness of breath and regular headaches.     Chronic kidney disease: She does not take any NSAIDs.  She drinks water throughout the day.    Prediabetes:  She is compliant with a low sugar/carbohydrate diet.  She is not exercising regularly.  Tobacco abuse: She is still smoking.  She does not have any desire to quit smoking. She does not smoke too much.    Osteoporosis: Her last bone density was 2014.  She had osteoporosis in her radius, but her other sites were osteopenic.  She is taking calcium and vitamin D daily    Medications and allergies reviewed with patient and updated if appropriate.  Patient Active Problem List   Diagnosis Date Noted  . Microscopic colitis 03/24/2018  . Syncope 09/21/2017  . Prediabetes 03/24/2017  . CKD (chronic kidney disease) 02/23/2016  . Dizziness 11/05/2015  . LBBB (left bundle branch block) 04/16/2015  . AAA (abdominal aortic aneurysm) without rupture ([place]) 04/08/2014  . INSOMNIA, CHRONIC 05/19/2009  . PAROXYSMAL ATRIAL FIBRILLATION 02/04/2009  . CAROTID ARTERY DISEASE 02/04/2009  . MURMUR 02/01/2009  . HIP PAIN 10/28/2008  . Essential  hypertension 12/13/2007  . PERIPHERAL VASCULAR DISEASE 04/10/2007  . VITAMIN B12 DEFICIENCY 01/05/2007  . VITAMIN D DEFICIENCY 01/05/2007  . Hyperlipidemia 01/05/2007  . Tobacco abuse 01/05/2007  . CORONARY ARTERY DISEASE 01/05/2007  . 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 Barrett's esophagus 01/05/2007  . Osteoarthritis 01/05/2007  . LOW BACK PAIN 01/05/2007  . Osteoporosis 01/05/2007    Current Outpatient Medications on File Prior to Visit  Medication Sig Dispense Refill  . Acetaminophen (TYLENOL ARTHRITIS PAIN PO) Take by mouth.    [name] Kitchen amLODipine (NORVASC) 5 MG tablet Take 1 tablet (5 mg total) by mouth 2 (two) times daily. 180 tablet 0  . atenolol (TENORMIN) 25 MG tablet Take 0.5 tablets (12.5 mg total) by mouth daily. 45 tablet 1  . calcium-vitamin D (OSCAL WITH D) 500-200 MG-UNIT per tablet Take 1 tablet by mouth daily.      . cyanocobalamin 1000 MCG tablet Take 100 mcg by mouth daily.     . hydrALAZINE (APRESOLINE) 10 MG tablet Take 1 tablet (10 mg total) by mouth 3 (three) times daily. 90 tablet 5  . Multiple Vitamins-Minerals (MULTIVITAMIN WITH MINERALS) tablet Take 1 tablet by mouth daily.     No current facility-administered medications on file prior to visit.     Past Medical History:  Diagnosis Date  . Anxiety   . Atrial fibrillation ([place])   .  Barrett's esophagus   . CAD (coronary artery disease)   . Cancer [facility])    Pre cancerous Uterine Tumor  . Carotid artery occlusion   . Chest pain    atypical normal myovue 2008  . COPD (chronic obstructive pulmonary disease) ([place])   . Cough   . Depression   . Fibromyalgia   . GERD (gastroesophageal reflux disease)   . Heart murmur    av sclerosis and mild ar echo 2008  . Hip pain   . History of hiatal hernia   . Hyperlipidemia   . Hypertension   . Insomnia, unspecified   . Kidney stone   . Lumbago   . Myalgia and myositis, unspecified    statin intolerant  . Occlusion and stenosis of vertebral  artery without mention of cerebral infarction   . Osteoarthritis   . Osteopenia   . Other B-complex deficiencies   . Peripheral artery disease ([place]) 12/02/2016  . Peripheral vascular disease ([place])   . Shortness of breath dyspnea   . Tobacco use disorder   . Unspecified vitamin D deficiency     Past Surgical History:  Procedure Laterality Date  . ABDOMINAL AORTIC ANEURYSM REPAIR N/A 05/02/2015   Procedure: REPAIR ABDOMINAL AORTIC ANEURYSM  ,AORTA BI-ILIAC;  Surgeon: Serafina Mitchell, MD;  Location: [facility];  Service: Vascular;  Laterality: N/A;  . ABDOMINAL HYSTERECTOMY  1986   Complete  . cataract surgery  2011  . COLONOSCOPY    . JOINT REPLACEMENT  2012   Right Shoulder and arm    Social History   Socioeconomic History  . Marital status: Single    Spouse name: Not on file  . Number of children: 2  . Years of education: Not on file  . Highest education level: Not on file  Occupational History  . Occupation: retired  Scientific laboratory technician  . Financial resource strain: Not hard at all  . Food insecurity    Worry: Never true    Inability: Never true  . Transportation needs    Medical: No    Non-medical: No  Tobacco Use  . Smoking status: Current Some Day Smoker    Packs/day: 0.25    Years: 57.00    Pack years: 14.25    Types: Cigarettes  . Smokeless tobacco: Never Used  Substance and Sexual Activity  . Alcohol use: No    Alcohol/week: 0.0 standard drinks  . Drug use: No  . Sexual activity: Not Currently  Lifestyle  . Physical activity    Days per week: 0 days    Minutes per session: 0 min  . Stress: Not at all  Relationships  . Social connections    Talks on phone: More than three times a week    Gets together: More than three times a week    Attends religious service: More than 4 times per year    Active member of club or organization: Yes    Attends meetings of clubs or organizations: More than 4 times per year    Relationship status: Not on file  Other Topics  Concern  . Not on file  Social History Narrative   Regular exercise--yes daily stretching.    Family History  Problem Relation Age of Onset  . Arthritis Mother   . Heart disease Mother   . Hypertension Mother   . Heart attack Mother   . Hyperlipidemia Other   . Heart disease Son   . Hypertension Son   . Cancer Father   .  Hyperlipidemia Sister   . Cancer Brother   . Colon cancer Neg Hx     Review of Systems  Constitutional: Negative for chills and fever.  HENT: Positive for hearing loss and tinnitus.   Respiratory: Negative for cough, shortness of breath and wheezing.   Cardiovascular: Negative for chest pain, palpitations and leg swelling.  Musculoskeletal: Positive for arthralgias and back pain.  Neurological: Positive for light-headedness (occ). Negative for headaches.       Objective:   Vitals:   03/27/19 1359  BP: 138/64  Pulse: 70  Resp: 16  Temp: 98.2 F (36.8 C)  SpO2: 96%   BP Readings from Last 3 Encounters:  03/27/19 138/64  06/27/18 (!) 171/85  03/24/18 130/64   Wt Readings from Last 3 Encounters:  03/27/19 174 lb 12.8 oz (79.3 kg)  06/27/18 177 lb (80.3 kg)  03/24/18 178 lb (80.7 kg)   Body mass index is 28.21 kg/m.   Physical Exam    Constitutional: Appears well-developed and well-nourished. No distress.  HENT:  Head: Normocephalic and atraumatic.  Neck: Neck supple. No tracheal deviation present. No thyromegaly present.  No cervical lymphadenopathy Cardiovascular: Normal rate, regular rhythm and normal heart sounds.   No murmur heard. No carotid bruit .  No edema Pulmonary/Chest: Effort normal and breath sounds normal. No respiratory distress. No has no wheezes. No rales.  Skin: Skin is warm and dry. Not diaphoretic.  Psychiatric: Normal mood and affect. Behavior is normal.      Assessment & Plan:    See Problem List for Assessment and Plan of chronic medical problems.

## 2019-03-27 ENCOUNTER — Other Ambulatory Visit (INDEPENDENT_AMBULATORY_CARE_PROVIDER_SITE_OTHER): Payer: Medicare Other

## 2019-03-27 ENCOUNTER — Other Ambulatory Visit: Payer: Self-pay

## 2019-03-27 ENCOUNTER — Encounter: Payer: Self-pay | Admitting: Internal Medicine

## 2019-03-27 ENCOUNTER — Ambulatory Visit (INDEPENDENT_AMBULATORY_CARE_PROVIDER_SITE_OTHER): Payer: Medicare Other | Admitting: Internal Medicine

## 2019-03-27 VITALS — BP 138/64 | HR 70 | Temp 98.2°F | Resp 16 | Ht 66.0 in | Wt 174.8 lb

## 2019-03-27 DIAGNOSIS — N1832 Chronic kidney disease, stage 3b: Secondary | ICD-10-CM

## 2019-03-27 DIAGNOSIS — Z720 Tobacco use: Secondary | ICD-10-CM

## 2019-03-27 DIAGNOSIS — I779 Disorder of arteries and arterioles, unspecified: Secondary | ICD-10-CM | POA: Diagnosis not present

## 2019-03-27 DIAGNOSIS — I10 Essential (primary) hypertension: Secondary | ICD-10-CM

## 2019-03-27 DIAGNOSIS — Z23 Encounter for immunization: Secondary | ICD-10-CM

## 2019-03-27 DIAGNOSIS — R32 Unspecified urinary incontinence: Secondary | ICD-10-CM | POA: Diagnosis not present

## 2019-03-27 DIAGNOSIS — M810 Age-related osteoporosis without current pathological fracture: Secondary | ICD-10-CM

## 2019-03-27 DIAGNOSIS — N3281 Overactive bladder: Secondary | ICD-10-CM | POA: Insufficient documentation

## 2019-03-27 DIAGNOSIS — E782 Mixed hyperlipidemia: Secondary | ICD-10-CM

## 2019-03-27 DIAGNOSIS — R350 Frequency of micturition: Secondary | ICD-10-CM

## 2019-03-27 DIAGNOSIS — R7303 Prediabetes: Secondary | ICD-10-CM | POA: Diagnosis not present

## 2019-03-27 LAB — COMPREHENSIVE METABOLIC PANEL
ALT: 11 U/L (ref 0–35)
AST: 28 U/L (ref 0–37)
Albumin: 4.3 g/dL (ref 3.5–5.2)
Alkaline Phosphatase: 79 U/L (ref 39–117)
BUN: 32 mg/dL — ABNORMAL HIGH (ref 6–23)
CO2: 26 mEq/L (ref 19–32)
Calcium: 9.5 mg/dL (ref 8.4–10.5)
Chloride: 108 mEq/L (ref 96–112)
Creatinine, Ser: 1.28 mg/dL — ABNORMAL HIGH (ref 0.40–1.20)
GFR: 39.86 mL/min — ABNORMAL LOW (ref >60.00)
Glucose, Bld: 109 mg/dL — ABNORMAL HIGH (ref 70–99)
Potassium: 4.2 mEq/L (ref 3.5–5.1)
Sodium: 141 mEq/L (ref 135–145)
Total Bilirubin: 0.3 mg/dL (ref 0.2–1.2)
Total Protein: 7.3 g/dL (ref 6.0–8.3)

## 2019-03-27 LAB — CBC WITH DIFFERENTIAL/PLATELET
Basophils Absolute: 0.1 10*3/uL (ref 0.0–0.1)
Basophils Relative: 1 % (ref 0.0–3.0)
Eosinophils Absolute: 0.2 10*3/uL (ref 0.0–0.7)
Eosinophils Relative: 2.2 % (ref 0.0–5.0)
HCT: 39.5 % (ref 36.0–46.0)
Hemoglobin: 13.2 g/dL (ref 12.0–15.0)
Lymphocytes Relative: 25.8 % (ref 12.0–46.0)
Lymphs Abs: 1.8 10*3/uL (ref 0.7–4.0)
MCHC: 33.5 g/dL (ref 30.0–36.0)
MCV: 88 fl (ref 78.0–100.0)
Monocytes Absolute: 0.7 10*3/uL (ref 0.1–1.0)
Monocytes Relative: 9.9 % (ref 3.0–12.0)
Neutro Abs: 4.2 10*3/uL (ref 1.4–7.7)
Neutrophils Relative %: 61.1 % (ref 43.0–77.0)
Platelets: 212 10*3/uL (ref 150.0–400.0)
RBC: 4.49 Mil/uL (ref 3.87–5.11)
RDW: 15.2 % (ref 11.5–15.5)
WBC: 6.9 10*3/uL (ref 4.0–10.5)

## 2019-03-27 LAB — LIPID PANEL
Cholesterol: 239 mg/dL — ABNORMAL HIGH (ref 0–200)
HDL: 45.8 mg/dL (ref >39.00)
LDL Cholesterol: 162 mg/dL — ABNORMAL HIGH (ref 0–99)
NonHDL: 193.19
Total CHOL/HDL Ratio: 5
Triglycerides: 157 mg/dL — ABNORMAL HIGH (ref 0.0–149.0)
VLDL: 31.4 mg/dL (ref 0.0–40.0)

## 2019-03-27 LAB — VITAMIN D 25 HYDROXY (VIT D DEFICIENCY, FRACTURES): VITD: 26.94 ng/mL — ABNORMAL LOW (ref 30.00–100.00)

## 2019-03-27 LAB — TSH: TSH: 2.94 u[IU]/mL (ref 0.35–4.50)

## 2019-03-27 LAB — HEMOGLOBIN A1C: Hgb A1c MFr Bld: 5.8 % (ref 4.6–6.5)

## 2019-03-27 NOTE — Assessment & Plan Note (Signed)
BP well controlled Current regimen effective and well tolerated Continue current medications at current doses cmp  

## 2019-03-27 NOTE — Assessment & Plan Note (Addendum)
dexa due - ordered Taking calcium and vitamin d  Not exercising regularly Check vitamin d level

## 2019-03-27 NOTE — Assessment & Plan Note (Signed)
Not smoking a lot, but has no desire to quit She understands the risks of smoking

## 2019-03-27 NOTE — Assessment & Plan Note (Signed)
Has been drinking a lot of water No nsaids cmp May need to see nephrology

## 2019-03-27 NOTE — Assessment & Plan Note (Signed)
Check a1c Low sugar / carb diet Stressed regular exercise   

## 2019-03-27 NOTE — Assessment & Plan Note (Signed)
Referred to urology

## 2019-03-27 NOTE — Assessment & Plan Note (Signed)
Check lipid panel, cmp, tsh Regular exercise and healthy diet encouraged   

## 2019-03-29 ENCOUNTER — Other Ambulatory Visit: Payer: Self-pay | Admitting: Internal Medicine

## 2019-03-29 DIAGNOSIS — E7849 Other hyperlipidemia: Secondary | ICD-10-CM

## 2019-03-29 MED ORDER — EZETIMIBE 10 MG PO TABS: 10.0000 mg | ORAL_TABLET | Freq: Every day | ORAL | 3 refills | Status: DC

## 2019-04-10 ENCOUNTER — Other Ambulatory Visit: Payer: Self-pay | Admitting: Internal Medicine

## 2019-05-07 ENCOUNTER — Other Ambulatory Visit: Payer: Self-pay | Admitting: Internal Medicine

## 2019-06-21 DIAGNOSIS — R351 Nocturia: Secondary | ICD-10-CM | POA: Diagnosis not present

## 2019-06-21 DIAGNOSIS — R350 Frequency of micturition: Secondary | ICD-10-CM | POA: Diagnosis not present

## 2019-06-21 DIAGNOSIS — N3941 Urge incontinence: Secondary | ICD-10-CM | POA: Diagnosis not present

## 2019-06-25 ENCOUNTER — Other Ambulatory Visit: Payer: Self-pay | Admitting: Internal Medicine

## 2019-07-09 ENCOUNTER — Other Ambulatory Visit: Payer: Self-pay | Admitting: Internal Medicine

## 2019-07-18 ENCOUNTER — Other Ambulatory Visit: Payer: Self-pay | Admitting: *Deleted

## 2019-07-18 DIAGNOSIS — I714 Abdominal aortic aneurysm, without rupture, unspecified: Secondary | ICD-10-CM

## 2019-07-18 DIAGNOSIS — Z8679 Personal history of other diseases of the circulatory system: Secondary | ICD-10-CM

## 2019-07-18 DIAGNOSIS — F17200 Nicotine dependence, unspecified, uncomplicated: Secondary | ICD-10-CM

## 2019-07-18 DIAGNOSIS — Z136 Encounter for screening for cardiovascular disorders: Secondary | ICD-10-CM

## 2019-07-18 DIAGNOSIS — I739 Peripheral vascular disease, unspecified: Secondary | ICD-10-CM

## 2019-07-19 ENCOUNTER — Telehealth (HOSPITAL_COMMUNITY): Payer: Self-pay

## 2019-07-19 NOTE — Telephone Encounter (Signed)
The above patient or their representative was contacted and gave the following answers to these questions:         Do you have any of the following symptoms?    NO  Fever                    Cough                   Shortness of breath  Do  you have any of the following other symptoms?    muscle pain         vomiting,        diarrhea        rash         weakness        red eye        abdominal pain         bruising          bruising or bleeding              joint pain           severe headache    Have you been in contact with someone who was or has been sick in the past 2 weeks?  NO  Yes                 Unsure                         Unable to assess   Does the person that you were in contact with have any of the following symptoms?   Cough         shortness of breath           muscle pain         vomiting,            diarrhea            rash            weakness           fever            red eye           abdominal pain           bruising  or  bleeding                joint pain                severe headache                 COMMENTS OR ACTION PLAN FOR THIS PATIENT:         ALL QUESTIONS WERE ANSWERED/[facility] PATIENT HAS GOTTEN COVID SHOTS/[facility]

## 2019-07-23 ENCOUNTER — Other Ambulatory Visit: Payer: Self-pay | Admitting: Surgery

## 2019-07-23 ENCOUNTER — Ambulatory Visit (HOSPITAL_COMMUNITY)
Admission: RE | Admit: 2019-07-23 | Discharge: 2019-07-23 | Disposition: A | Discharge status: Home / Self Care | Payer: Medicare Other | Source: Ambulatory Visit | Attending: Surgery | Admitting: Surgery

## 2019-07-23 ENCOUNTER — Ambulatory Visit (INDEPENDENT_AMBULATORY_CARE_PROVIDER_SITE_OTHER)
Admission: RE | Admit: 2019-07-23 | Discharge: 2019-07-23 | Disposition: A | Discharge status: Home / Self Care | Payer: Medicare Other | Source: Ambulatory Visit | Attending: Surgery | Admitting: Surgery

## 2019-07-23 ENCOUNTER — Ambulatory Visit (INDEPENDENT_AMBULATORY_CARE_PROVIDER_SITE_OTHER): Payer: Medicare Other | Admitting: Physician Assistant

## 2019-07-23 ENCOUNTER — Other Ambulatory Visit: Payer: Self-pay

## 2019-07-23 VITALS — BP 127/69 | HR 58 | Temp 96.6°F | Resp 14 | Ht 66.0 in | Wt 180.0 lb

## 2019-07-23 DIAGNOSIS — I714 Abdominal aortic aneurysm, without rupture, unspecified: Secondary | ICD-10-CM

## 2019-07-23 DIAGNOSIS — I739 Peripheral vascular disease, unspecified: Secondary | ICD-10-CM | POA: Diagnosis not present

## 2019-07-23 DIAGNOSIS — Z136 Encounter for screening for cardiovascular disorders: Secondary | ICD-10-CM

## 2019-07-23 DIAGNOSIS — F17200 Nicotine dependence, unspecified, uncomplicated: Secondary | ICD-10-CM

## 2019-07-23 DIAGNOSIS — Z9889 Other specified postprocedural states: Secondary | ICD-10-CM | POA: Diagnosis not present

## 2019-07-23 DIAGNOSIS — Z8679 Personal history of other diseases of the circulatory system: Secondary | ICD-10-CM

## 2019-07-23 DIAGNOSIS — Z87891 Personal history of nicotine dependence: Secondary | ICD-10-CM | POA: Insufficient documentation

## 2019-07-23 NOTE — Progress Notes (Signed)
[facility] HISTORY AND PHYSICAL   History of Present Illness:  Patient is a 85 y.o. year old female who presented  With a 5.1 cm AAA that was not a candidate for EVAR repair.  She underwent open Aorticaneurysm using an 18 x 9 bifurcated dacryon graft with reimplantation of the inferior mesenteric artery into the left limb of the graft.  She continues to reports times of unstable balance and dizziness, but has not fallen.  She denise symptoms of claudication, non healing wounds, rest pain or abdominal or back pain.    Past medical hx: + tobacco abuse, HTN, Zetia for hypercholesterolemia.  Past Medical History:  Diagnosis Date  . Anxiety   . Atrial fibrillation ([place])   . Barrett's esophagus   . CAD (coronary artery disease)   . Cancer [facility])    Pre cancerous Uterine Tumor  . Carotid artery occlusion   . Chest pain    atypical normal myovue 2008  . COPD (chronic obstructive pulmonary disease) ([place])   . Cough   . Depression   . Fibromyalgia   . GERD (gastroesophageal reflux disease)   . Heart murmur    av sclerosis and mild ar echo 2008  . Hip pain   . History of hiatal hernia   . Hyperlipidemia   . Hypertension   . Insomnia, unspecified   . Kidney stone   . Lumbago   . Myalgia and myositis, unspecified    statin intolerant  . 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 Osteoarthritis   . Osteopenia   . Other B-complex deficiencies   . Peripheral artery disease ([place]) 12/02/2016  . Peripheral vascular disease ([place])   . Shortness of breath dyspnea   . Tobacco use disorder   . Unspecified vitamin D deficiency     Past Surgical History:  Procedure Laterality Date  . ABDOMINAL AORTIC ANEURYSM REPAIR N/A 05/02/2015   Procedure: REPAIR ABDOMINAL AORTIC ANEURYSM  ,AORTA BI-ILIAC;  Surgeon: Serafina Mitchell, MD;  Location: [facility];  Service: Vascular;  Laterality: N/A;  . ABDOMINAL HYSTERECTOMY  1986   Complete   . cataract surgery  2011  . COLONOSCOPY    . JOINT REPLACEMENT  2012   Right Shoulder and arm     Social History Social History   Tobacco Use  . Smoking status: Current Some Day Smoker    Packs/day: 0.25    Years: 57.00    Pack years: 14.25    Types: Cigarettes  . Smokeless tobacco: Never Used  Substance Use Topics  . Alcohol use: No    Alcohol/week: 0.0 standard drinks  . Drug use: No    Family History Family History  Problem Relation Age of Onset  . Arthritis Mother   . Heart disease Mother   . Hypertension Mother   . Heart attack Mother   . Hyperlipidemia Other   . Heart disease Son   . Hypertension Son   . Cancer Father   . Hyperlipidemia Sister   . Cancer Brother   . Colon cancer Neg Hx     Allergies  Allergies  Allergen Reactions  . Penicillins Rash    Has patient had a PCN reaction causing immediate rash, facial/tongue/throat swelling, SOB or lightheadedness with hypotension: Yes Has patient had a PCN reaction causing severe rash involving mucus membranes or skin necrosis: No Has patient had a PCN reaction that required hospitalization No Has patient had a PCN reaction  occurring within the last 10 years: No If all of the above answers are "NO", then may proceed with Cephalosporin use.   Tori Milks [Naproxen] Other (See Comments)    Caused scar tissue   . Benazepril Hcl     REACTION: cough  . Carvedilol     REACTION: ear buzzing ??  . Indomethacin   . Pravastatin Sodium     REACTION: achy  . Rosuvastatin     REACTION: buzzing  . Simvastatin     REACTION: myalgia  . Tramadol     Ears ringing?  [name] Kitchen Advil [Ibuprofen] Rash     Current Outpatient Medications  Medication Sig Dispense Refill  . Acetaminophen (TYLENOL ARTHRITIS PAIN PO) Take by mouth.    [name] Kitchen amLODipine (NORVASC) 5 MG tablet TAKE 1 TABLET BY MOUTH TWICE DAILY. 180 tablet 1  . atenolol (TENORMIN) 25 MG tablet TAKE 1/2 TABLET DAILY. 45 tablet 0  . calcium-vitamin D (OSCAL WITH D)  500-200 MG-UNIT per tablet Take 1 tablet by mouth daily.      . cyanocobalamin 1000 MCG tablet Take 100 mcg by mouth daily.     [name] Kitchen ezetimibe (ZETIA) 10 MG tablet Take 1 tablet (10 mg total) by mouth daily. 90 tablet 3  . hydrALAZINE (APRESOLINE) 10 MG tablet TAKE 1 TABLET BY MOUTH 3 TIMES A DAY. 90 tablet 0  . Multiple Vitamins-Minerals (MULTIVITAMIN WITH MINERALS) tablet Take 1 tablet by mouth daily.     No current facility-administered medications for this visit.    ROS:   General:  No weight loss, Fever, chills  HEENT: No recent headaches, no nasal bleeding, no visual changes, no sore throat  Neurologic: No dizziness, blackouts, seizures. No recent symptoms of stroke or mini- stroke. No recent episodes of slurred speech, or temporary blindness. {x] unstable balance  Cardiac: No recent episodes of chest pain/pressure, no shortness of breath at rest.  No shortness of breath with exertion.  Denies history of atrial fibrillation or irregular heartbeat  Vascular: No history of rest pain in feet.  No history of claudication.  No history of non-healing ulcer, No history of DVT   Pulmonary: No home oxygen, no productive cough, no hemoptysis,  No asthma or wheezing  Musculoskeletal:  [ ]  Arthritis, [ ]  Low back pain,  [x ] Joint pain  Hematologic:No history of hypercoagulable state.  No history of easy bleeding.  No history of anemia  Gastrointestinal: No hematochezia or melena,  No gastroesophageal reflux, no trouble swallowing  Urinary: [ ]  chronic Kidney disease, [ ]  on HD - [ ]  MWF or [ ]  TTHS, [ ]  Burning with urination, [ ]  Frequent urination, [ ]  Difficulty urinating;   Skin: No rashes  Psychological: No history of anxiety,  No history of depression   Physical Examination  Vitals:   07/23/19 1018  BP: 127/69  Pulse: (!) 58  Resp: 14  Temp: (!) 96.6 F (35.9 C)  TempSrc: Temporal  SpO2: 97%  Weight: 180 lb (81.6 kg)  Height: 5\' 6"  (1.676 m)    Body mass index is  29.05 kg/m.  General:  Alert and oriented, no acute distress HEENT: Normal Neck: No bruit or JVD Pulmonary: Clear to auscultation bilaterally Cardiac: Regular Rate and Rhythm without murmur Gastrointestinal: Soft, non-tender, non-distended, no mass, no scars Skin: No rash Extremity Pulses:  2+ radial, brachial, femoral pulses bilaterally, non palpable  dorsalis pedis, posterior tibial  Musculoskeletal: No deformity or edema  Neurologic: Upper and lower extremity motor grossly  5/5 and symmetric  DATA:    Abdominal Aorta Findings:  +-----------+-------+----------+----------+--------+--------+--------+  Location  AP (cm)Trans (cm)PSV (cm/s)WaveformThrombusComments  +-----------+-------+----------+----------+--------+--------+--------+  Proximal  4.42  4.37   84                  +-----------+-------+----------+----------+--------+--------+--------+  Mid    2.22  2.80   87                  +-----------+-------+----------+----------+--------+--------+--------+  Distal   2.59  3.06   52                  +-----------+-------+----------+----------+--------+--------+--------+  RT CIA Prox1.1  1.0    126                  +-----------+-------+----------+----------+--------+--------+--------+  LT CIA Prox1.0  1.1    187                  +-----------+-------+----------+----------+--------+--------+--------+  Summary:  Stenosis:  Patent aorto bi-iliac graft with no evidence of stenosis.      ABI Findings:  +---------+------------------+-----+----------+--------+  Right  Rt Pressure (mmHg)IndexWaveform Comment   +---------+------------------+-----+----------+--------+  Brachial 140                      +---------+------------------+-----+----------+--------+  PTA   98        0.70  monophasic      +---------+------------------+-----+----------+--------+  DP    93        0.66 monophasic      +---------+------------------+-----+----------+--------+  Great Toe71        0.51            +---------+------------------+-----+----------+--------+   +---------+------------------+-----+----------+-------+  Left   Lt Pressure (mmHg)IndexWaveform Comment  +---------+------------------+-----+----------+-------+  Brachial 130                      +---------+------------------+-----+----------+-------+  PTA   110        0.79 monophasic      +---------+------------------+-----+----------+-------+  DP    99        0.71 monophasic      +---------+------------------+-----+----------+-------+  Great Toe73        0.52            +---------+------------------+-----+----------+-------+   +-------+-----------+-----------+------------+------------+  ABI/TBIToday's ABIToday's TBIPrevious ABIPrevious TBI  +-------+-----------+-----------+------------+------------+  Right 0.7    0.51    0.68    0.46      +-------+-----------+-----------+------------+------------+  Left  0.79    0.52    0.73    0.29      +-------+-----------+-----------+------------+------------+    Previous ABI on 06/27/18.    Summary:  Right: Resting right ankle-brachial index indicates moderate right lower  extremity arterial disease. The right toe-brachial index is abnormal. RT  great toe pressure = 71 mmHg.   Left: Resting left ankle-brachial index indicates moderate left lower  extremity arterial disease. The left toe-brachial index is abnormal. LT  Great toe pressure = 73 mmHg.          ASSESSMENT:  AAA s/p open repair Aorta Bi-iliac 05/02/2015 Asymptomatic post op course.     PLAN: She denise abdominal and  lumbar pain.  She has no history of claudication, rest pain or non healing wounds.  Plan for f/u ABI and AAA duplex in 1 year.  If she develops pain or non healing wounds she will call sooner.     Roxy Horseman PA-C [facility] Office: [phone]  MD in clinic  [place]

## 2019-07-24 ENCOUNTER — Other Ambulatory Visit: Payer: Self-pay | Admitting: *Deleted

## 2019-07-24 DIAGNOSIS — I714 Abdominal aortic aneurysm, without rupture, unspecified: Secondary | ICD-10-CM

## 2019-07-24 DIAGNOSIS — I739 Peripheral vascular disease, unspecified: Secondary | ICD-10-CM

## 2019-08-08 ENCOUNTER — Other Ambulatory Visit: Payer: Self-pay | Admitting: Internal Medicine

## 2019-09-10 ENCOUNTER — Other Ambulatory Visit: Payer: Self-pay | Admitting: Internal Medicine

## 2019-09-11 ENCOUNTER — Other Ambulatory Visit: Payer: Self-pay | Admitting: Internal Medicine

## 2019-09-23 NOTE — Patient Instructions (Signed)
  Blood work was ordered.     Medications reviewed and updated.  Changes include :     Your prescription(s) have been submitted to your pharmacy. Please take as directed and contact our office if you believe you are having problem(s) with the medication(s).  A referral was ordered for        Someone will call you to schedule this.    Please followup in 6 months   

## 2019-09-23 NOTE — Progress Notes (Signed)
Subjective:    Patient ID: Margaret Foley, female    DOB: 04/26/1935, 85 y.o.   MRN: [id number]  HPI The patient is here for follow up of their chronic medical problems, including hypertension, prediabetes, hyperlipidemia, CKD, OP, vitamin D def.  She is taking all of her medications as prescribed.   She is exercising regularly.       Medications and allergies reviewed with patient and updated if appropriate.  Patient Active Problem List   Diagnosis Date Noted  . Urinary frequency 03/27/2019  . Urinary incontinence 03/27/2019  . Microscopic colitis 03/24/2018  . Syncope 09/21/2017  . Prediabetes 03/24/2017  . CKD (chronic kidney disease) 02/23/2016  . Dizziness 11/05/2015  . LBBB (left bundle branch block) 04/16/2015  . AAA (abdominal aortic aneurysm) without rupture ([place]) 04/08/2014  . INSOMNIA, CHRONIC 05/19/2009  . PAROXYSMAL ATRIAL FIBRILLATION 02/04/2009  . CAROTID ARTERY DISEASE 02/04/2009  . MURMUR 02/01/2009  . HIP PAIN 10/28/2008  . Essential hypertension 12/13/2007  . PERIPHERAL VASCULAR DISEASE 04/10/2007  . VITAMIN B12 DEFICIENCY 01/05/2007  . VITAMIN D DEFICIENCY 01/05/2007  . Hyperlipidemia 01/05/2007  . Tobacco abuse 01/05/2007  . CORONARY ARTERY DISEASE 01/05/2007  . 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 Barrett's esophagus 01/05/2007  . Osteoarthritis 01/05/2007  . LOW BACK PAIN 01/05/2007  . Osteoporosis 01/05/2007    Current Outpatient Medications on File Prior to Visit  Medication Sig Dispense Refill  . Acetaminophen (TYLENOL ARTHRITIS PAIN PO) Take by mouth.    [name] Kitchen amLODipine (NORVASC) 5 MG tablet TAKE 1 TABLET BY MOUTH TWICE DAILY. 180 tablet 1  . atenolol (TENORMIN) 25 MG tablet TAKE 1/2 TABLET DAILY. 45 tablet 0  . calcium-vitamin D (OSCAL WITH D) 500-200 MG-UNIT per tablet Take 1 tablet by mouth daily.      . cyanocobalamin 1000 MCG tablet Take 100 mcg by mouth daily.     [name] Kitchen ezetimibe (ZETIA) 10 MG tablet Take 1 tablet  (10 mg total) by mouth daily. 90 tablet 3  . hydrALAZINE (APRESOLINE) 10 MG tablet TAKE 1 TABLET BY MOUTH 3 TIMES A DAY. 90 tablet 2  . Multiple Vitamins-Minerals (MULTIVITAMIN WITH MINERALS) tablet Take 1 tablet by mouth daily.     No current facility-administered medications on file prior to visit.    Past Medical History:  Diagnosis Date  . Anxiety   . Atrial fibrillation ([place])   . Barrett's esophagus   . CAD (coronary artery disease)   . Cancer [facility])    Pre cancerous Uterine Tumor  . Carotid artery occlusion   . Chest pain    atypical normal myovue 2008  . COPD (chronic obstructive pulmonary disease) ([place])   . Cough   . Depression   . Fibromyalgia   . GERD (gastroesophageal reflux disease)   . Heart murmur    av sclerosis and mild ar echo 2008  . Hip pain   . History of hiatal hernia   . Hyperlipidemia   . Hypertension   . Insomnia, unspecified   . Kidney stone   . Lumbago   . Myalgia and myositis, unspecified    statin intolerant  . 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 Osteoarthritis   . Osteopenia   . Other B-complex deficiencies   . Peripheral artery disease ([place]) 12/02/2016  . Peripheral vascular disease ([place])   . Shortness of breath dyspnea   . Tobacco use disorder   . Unspecified vitamin D deficiency  Past Surgical History:  Procedure Laterality Date  . ABDOMINAL AORTIC ANEURYSM REPAIR N/A 05/02/2015   Procedure: REPAIR ABDOMINAL AORTIC ANEURYSM  ,AORTA BI-ILIAC;  Surgeon: Serafina Mitchell, MD;  Location: [facility];  Service: Vascular;  Laterality: N/A;  . ABDOMINAL HYSTERECTOMY  1986   Complete  . cataract surgery  2011  . COLONOSCOPY    . JOINT REPLACEMENT  2012   Right Shoulder and arm    Social History   Socioeconomic History  . Marital status: Single    Spouse name: Not on file  . Number of children: 2  . Years of education: Not on file  . Highest education level: Not on file  Occupational  History  . Occupation: retired  Tobacco Use  . Smoking status: Current Some Day Smoker    Packs/day: 0.25    Years: 57.00    Pack years: 14.25    Types: Cigarettes  . Smokeless tobacco: Never Used  Substance and Sexual Activity  . Alcohol use: No    Alcohol/week: 0.0 standard drinks  . Drug use: No  . Sexual activity: Not Currently  Other Topics Concern  . Not on file  Social History Narrative   Regular exercise--yes daily stretching.   Social Determinants of Health   Financial Resource Strain:   . Difficulty of Paying Living Expenses:   Food Insecurity:   . Worried About Charity fundraiser in the Last Year:   . Arboriculturist in the Last Year:   Transportation Needs:   . Film/video editor (Medical):   [name] Kitchen Lack of Transportation (Non-Medical):   Physical Activity:   . Days of Exercise per Week:   . Minutes of Exercise per Session:   Stress:   . Feeling of Stress :   Social Connections:   . Frequency of Communication with Friends and Family:   . Frequency of Social Gatherings with Friends and Family:   . Attends Religious Services:   . Active Member of Clubs or Organizations:   . Attends Archivist Meetings:   [name] Kitchen Marital Status:     Family History  Problem Relation Age of Onset  . Arthritis Mother   . Heart disease Mother   . Hypertension Mother   . Heart attack Mother   . Hyperlipidemia Other   . Heart disease Son   . Hypertension Son   . Cancer Father   . Hyperlipidemia Sister   . Cancer Brother   . Colon cancer Neg Hx     Review of Systems     Objective:  There were no vitals filed for this visit. BP Readings from Last 3 Encounters:  07/23/19 127/69  03/27/19 138/64  06/27/18 (!) 171/85   Wt Readings from Last 3 Encounters:  07/23/19 180 lb (81.6 kg)  03/27/19 174 lb 12.8 oz (79.3 kg)  06/27/18 177 lb (80.3 kg)   There is no height or weight on file to calculate BMI.   Physical Exam    Constitutional: Appears well-developed  and well-nourished. No distress.  HENT:  Head: Normocephalic and atraumatic.  Neck: Neck supple. No tracheal deviation present. No thyromegaly present.  No cervical lymphadenopathy Cardiovascular: Normal rate, regular rhythm and normal heart sounds.   No murmur heard. No carotid bruit .  No edema Pulmonary/Chest: Effort normal and breath sounds normal. No respiratory distress. No has no wheezes. No rales.  Skin: Skin is warm and dry. Not diaphoretic.  Psychiatric: Normal mood and affect. Behavior is normal.  Assessment & Plan:    See Problem List for Assessment and Plan of chronic medical problems.    This visit occurred during the SARS-CoV-2 public health emergency.  Safety protocols were in place, including screening questions prior to the visit, additional usage of staff PPE, and extensive cleaning of exam room while observing appropriate contact time as indicated for disinfecting solutions.    This encounter was created in error - please disregard.

## 2019-09-24 ENCOUNTER — Encounter: Payer: Medicare Other | Admitting: Internal Medicine

## 2019-09-25 ENCOUNTER — Telehealth: Payer: Self-pay | Admitting: Internal Medicine

## 2019-09-25 DIAGNOSIS — I714 Abdominal aortic aneurysm, without rupture, unspecified: Secondary | ICD-10-CM

## 2019-09-25 NOTE — Progress Notes (Signed)
°  Chronic Care Management   Note  09/25/2019 Name: Margaret Foley MRN: [id number] DOB: Nov 16, 1934  Margaret Foley is a 85 y.o. year old female who is a primary care patient of Burns, Claudina Lick, MD. I reached out to [name] by phone today in response to a referral sent by Margaret Foley PCP, Binnie Rail, MD.   Margaret Foley was given information about Chronic Care Management services today including:  1. CCM service includes personalized support from designated clinical staff supervised by her physician, including individualized plan of care and coordination with other care providers 2. 24/7 contact phone numbers for assistance for urgent and routine care needs. 3. Service will only be billed when office clinical staff spend 20 minutes or more in a month to coordinate care. 4. Only one practitioner may furnish and bill the service in a calendar month. 5. The patient may stop CCM services at any time (effective at the end of the month) by phone call to the office staff.   Patient agreed to services and verbal consent obtained.   This note is not being shared with the patient for the following reason: To respect privacy (The patient or proxy has requested that the information not be shared).  Follow up plan:   Earney [place] Upstream Scheduler

## 2019-11-06 ENCOUNTER — Other Ambulatory Visit: Payer: Self-pay | Admitting: Internal Medicine

## 2019-11-14 ENCOUNTER — Other Ambulatory Visit: Payer: Self-pay | Admitting: Internal Medicine

## 2019-11-20 NOTE — Addendum Note (Signed)
Addended by: Aviva Signs M on: 11/20/2019 03:10 PM   Modules accepted: Orders

## 2019-11-21 ENCOUNTER — Ambulatory Visit: Payer: Medicare Other | Admitting: Pharmacist

## 2019-11-21 ENCOUNTER — Other Ambulatory Visit: Payer: Self-pay

## 2019-11-21 DIAGNOSIS — M1990 Unspecified osteoarthritis, unspecified site: Secondary | ICD-10-CM

## 2019-11-21 DIAGNOSIS — M810 Age-related osteoporosis without current pathological fracture: Secondary | ICD-10-CM

## 2019-11-21 DIAGNOSIS — E7849 Other hyperlipidemia: Secondary | ICD-10-CM

## 2019-11-21 DIAGNOSIS — I10 Essential (primary) hypertension: Secondary | ICD-10-CM

## 2019-11-21 NOTE — Patient Instructions (Addendum)
Visit Information  Phone number for Pharmacist: [phone]  Thank you for meeting with me to discuss your medications! I look forward to working with you to achieve your health care goals. Below is a summary of what we talked about during the visit:  Goals Addressed            This Visit's Progress   . Pharmacy Care Plan       CARE PLAN ENTRY (see longitudinal plan of care for additional care plan information)  Current Barriers:  . Chronic Disease Management support, education, and care coordination needs related to Hypertension, Hyperlipidemia, Osteoporosis, and Osteoarthritis   Hypertension BP Readings from Last 3 Encounters:  07/23/19 127/69  03/27/19 138/64  06/27/18 (!) 171/85 .  Pharmacist Clinical Goal(s): o Over the next 60 days, patient will work with PharmD and providers to maintain BP goal <140/90 . Current regimen:  o Atenolol 25 mg - 1/2 tab daily o Amlodipine 5 mg twice a day o Hydralazine 10 mg 3 times daily . Interventions: o Discussed BP goals and benefits of medication for prevention of heart attack / stroke o Discussed low blood pressure (<110/60) can contribute to dizziness, lightheadedness. Recommended to monitor blood pressure at home to check for low blood pressures . Patient self care activities - Over the next 90 days, patient will: o Check BP as needed, document, and provide at future appointments o Ensure daily salt intake < 2300 mg/day  Hyperlipidemia Lab Results  Component Value Date/Time   LDLCALC 162 (H) 03/27/2019 02:49 PM   LDLDIRECT 176.0 03/24/2017 11:35 AM .  Pharmacist Clinical Goal(s): o Over the next 90 days, patient will work with PharmD and providers to achieve LDL goal < 70 . Current regimen:  o Ezetimibe 10 mg daily o Nature-Made Cholestoff . Interventions: o Discussed cholesterol goals and benefits of medication for prevention of heart attack / stroke . Patient self care activities - Over the next 90 days, patient  will: o Continue medications as prescribed o Reduce cholesterol in diet (see attached)  Osteoporosis . Pharmacist Clinical Goal(s) o Over the next 90 days, patient will work with PharmD and providers to optimize bone health . Current regimen:  o Calcium 1200 mg- Vitamin D 1000 units daily . Interventions: o Discussed benefits of calcium/Vitamin D for bone health to prevent fractures o Recommended to schedule DEXA scan (ordered in Nov 2020 but never scheduled) . Patient self care activities - Over the next 90 days, patient will: o Continue calcium and Vitamin D supplements o Follow up with PCP office to schedule DEXA bone scan  Osteoarthritis . Pharmacist Clinical Goal(s) o Over the next 90 days, patient will work with PharmD and providers to optimize therapy . Current regimen:  o Advil Arthritis . Interventions: o Discussed Advil can damage the kidneys; recommend Tylenol Arthritis instead as it is safe for kidneys . Patient self care activities - Over the next 90 days, patient will: o Take Tylenol Arthritis up to 4 tablets daily for arthritis pain o Avoid NSAIDs - Advil (ibuprofen) and Aleve (naproxen) due to kidneys  Medication management . Pharmacist Clinical Goal(s): o Over the next 90 days, patient will work with PharmD and providers to maintain optimal medication adherence . Current pharmacy: [facility] . Interventions o Comprehensive medication review performed. o Recommend using pill cutter for atenolol o Continue current medication management strategy . Patient self care activities - Over the next 90 days, patient will: o Focus on medication adherence by pill  box o Request Pill Cutter from [facility] o Take medications as prescribed o Report any questions or concerns to PharmD and/or provider(s)  Initial goal documentation       Margaret Foley was given information about Chronic Care Management services today including:  1. CCM service includes  personalized support from designated clinical staff supervised by her physician, including individualized plan of care and coordination with other care providers 2. 24/7 contact phone numbers for assistance for urgent and routine care needs. 3. Standard insurance, coinsurance, copays and deductibles apply for chronic care management only during months in which we provide at least 20 minutes of these services. Most insurances cover these services at 100%, however patients may be responsible for any copay, coinsurance and/or deductible if applicable. This service may help you avoid the need for more expensive face-to-face services. 4. Only one practitioner may furnish and bill the service in a calendar month. 5. The patient may stop CCM services at any time (effective at the end of the month) by phone call to the office staff.  Patient agreed to services and verbal consent obtained.   The patient verbalized understanding of instructions provided today and agreed to receive a mailed copy of patient instruction and/or educational materials. Telephone follow up appointment with pharmacy team member scheduled for: 3 months  Charlene Brooke, PharmD Clinical Pharmacist [facility] [phone]  Cholesterol Content in Foods Cholesterol is a waxy, fat-like substance that helps to carry fat in the blood. The body needs cholesterol in small amounts, but too much cholesterol can cause damage to the arteries and heart. Most people should eat less than 200 milligrams (mg) of cholesterol a day. Foods with cholesterol  Cholesterol is found in animal-based foods, such as meat, seafood, and dairy. Generally, low-fat dairy and lean meats have less cholesterol than full-fat dairy and fatty meats. The milligrams of cholesterol per serving (mg per serving) of common cholesterol-containing foods are listed below. Meat and other proteins  Egg -- one large whole egg has 186 mg.  Veal shank  -- 4 oz has 141 mg.  Lean ground [place] (93% lean) -- 4 oz has 118 mg.  Fat-trimmed lamb loin -- 4 oz has 106 mg.  Lean ground beef (90% lean) -- 4 oz has 100 mg.  Lobster -- 3.5 oz has 90 mg.  Pork loin chops -- 4 oz has 86 mg.  Canned salmon -- 3.5 oz has 83 mg.  Fat-trimmed beef top loin -- 4 oz has 78 mg.  Frankfurter -- 1 frank (3.5 oz) has 77 mg.  Crab -- 3.5 oz has 71 mg.  Roasted chicken without skin, white meat -- 4 oz has 66 mg.  Light bologna -- 2 oz has 45 mg.  Deli-cut [place] -- 2 oz has 31 mg.  Canned tuna -- 3.5 oz has 31 mg.  Berniece Salines -- 1 oz has 29 mg.  Oysters and mussels (raw) -- 3.5 oz has 25 mg.  Mackerel -- 1 oz has 22 mg.  Trout -- 1 oz has 20 mg.  Pork sausage -- 1 link (1 oz) has 17 mg.  Salmon -- 1 oz has 16 mg.  Tilapia -- 1 oz has 14 mg. Dairy  Soft-serve ice cream --  cup (4 oz) has 103 mg.  Whole-milk yogurt -- 1 cup (8 oz) has 29 mg.  Cheddar cheese -- 1 oz has 28 mg.  American cheese -- 1 oz has 28 mg.  Whole milk --  1 cup (8 oz) has 23 mg.  2% milk -- 1 cup (8 oz) has 18 mg.  Cream cheese -- 1 tablespoon (Tbsp) has 15 mg.  Cottage cheese --  cup (4 oz) has 14 mg.  Low-fat (1%) milk -- 1 cup (8 oz) has 10 mg.  Sour cream -- 1 Tbsp has 8.5 mg.  Low-fat yogurt -- 1 cup (8 oz) has 8 mg.  Nonfat Greek yogurt -- 1 cup (8 oz) has 7 mg.  Half-and-half cream -- 1 Tbsp has 5 mg. Fats and oils  Cod liver oil -- 1 tablespoon (Tbsp) has 82 mg.  Butter -- 1 Tbsp has 15 mg.  Lard -- 1 Tbsp has 14 mg.  Bacon grease -- 1 Tbsp has 14 mg.  Mayonnaise -- 1 Tbsp has 5-10 mg.  Margarine -- 1 Tbsp has 3-10 mg. Exact amounts of cholesterol in these foods may vary depending on specific ingredients and brands. Foods without cholesterol Most plant-based foods do not have cholesterol unless you combine them with a food that has cholesterol. Foods without cholesterol include:  Grains and  cereals.  Vegetables.  Fruits.  Vegetable oils, such as olive, canola, and sunflower oil.  Legumes, such as peas, beans, and lentils.  Nuts and seeds.  Egg whites. Summary  The body needs cholesterol in small amounts, but too much cholesterol can cause damage to the arteries and heart.  Most people should eat less than 200 milligrams (mg) of cholesterol a day. This information is not intended to replace advice given to you by your health care provider. Make sure you discuss any questions you have with your health care provider. Document Revised: 04/15/2017 Document Reviewed: 12/28/2016 Elsevier Patient Education  [place].

## 2019-11-21 NOTE — Chronic Care Management (AMB) (Signed)
Chronic Care Management Pharmacy  Name: Margaret Foley  MRN: [id number] DOB: November 23, 1934   Chief Complaint/ HPI  [name],  85 y.o. , female presents for their Initial CCM visit with the clinical pharmacist via telephone due to COVID-19 Pandemic.  PCP : Binnie Rail, MD Patient Care Team: Binnie Rail, MD as PCP - General (Internal Medicine) Garald Balding, MD (Orthopedic Surgery) Josue Hector, MD (Cardiology) Charlton Haws, [facility] as Pharmacist (Pharmacist)  Their chronic conditions include: Hypertension, Hyperlipidemia, Atrial Fibrillation, Coronary Artery Disease, Chronic Kidney Disease, Osteoporosis, Osteoarthritis, Tobacco use, Overactive Bladder and Barrett's esophagus, Peripheral vascular disease  Lived in [place] in the 60s, moved back around 2000. Pt is divorced now. Has 2 sons, 1 in [place], 1 in [place]. 5 grandchildren ages 49s-30s. She is divorced, retired from [employer]. Spends most time at home, housework, yardwork. She does not drive. Son usually takes her to doctor's appts, gets groceries, etc. Naps around 2pm every day. Watches movies at night - likes mysteries, whodunnit; crossword puzzles.  Office Visits: 03/27/19 Dr [name] OV: referred to urology for urinary frequency. No desire to quit smoking. DEXA ordered. Started Zetia.  Consult Visit: 07/23/19 Vasc surg OV: s/p AAA repair (2016), no issues, no med changes. 06/21/19 Dr McDiarmid (urology): f/u for urinary frequency/incontinence  Allergies  Allergen Reactions  . Penicillins Rash    Has patient had a PCN reaction causing immediate rash, facial/tongue/throat swelling, SOB or lightheadedness with hypotension: Yes Has patient had a PCN reaction causing severe rash involving mucus membranes or skin necrosis: No Has patient had a PCN reaction that required hospitalization No Has patient had a PCN reaction occurring within the last 10 years: No If all of the above answers are "NO",  then may proceed with Cephalosporin use.   Tori Milks [Naproxen] Other (See Comments)    Caused scar tissue   . Benazepril Hcl     REACTION: cough  . Carvedilol     REACTION: ear buzzing ??  . Indomethacin   . Pravastatin Sodium     REACTION: achy  . Rosuvastatin     REACTION: buzzing  . Simvastatin     REACTION: myalgia  . Tramadol     Ears ringing?  [name] Kitchen Advil [Ibuprofen] Rash    Medications: Outpatient Encounter Medications as of 11/21/2019  Medication Sig  . Acetaminophen (TYLENOL ARTHRITIS PAIN PO) Take by mouth.  [name] Kitchen amLODipine (NORVASC) 5 MG tablet TAKE 1 TABLET BY MOUTH TWICE DAILY.  [name] Kitchen atenolol (TENORMIN) 25 MG tablet TAKE 1/2 TABLET DAILY.  . cyanocobalamin 1000 MCG tablet Take 100 mcg by mouth daily.   [name] Kitchen ezetimibe (ZETIA) 10 MG tablet Take 1 tablet (10 mg total) by mouth daily.  . hydrALAZINE (APRESOLINE) 10 MG tablet TAKE 1 TABLET BY MOUTH 3 TIMES A DAY.  . Multiple Vitamins-Minerals (MULTIVITAMIN WITH MINERALS) tablet Take 1 tablet by mouth daily.  . Plant Sterols and Stanols (CHOLESTOFF PO) Take 1 tablet by mouth daily.  . calcium-vitamin D (OSCAL WITH D) 500-200 MG-UNIT per tablet Take 1 tablet by mouth daily.   (Patient not taking: Reported on 11/21/2019)   No facility-administered encounter medications on file as of 11/21/2019.     Current Diagnosis/Assessment:  SDOH Interventions     Most Recent Value  SDOH Interventions  Financial Strain Interventions Intervention Not Indicated  Transportation Interventions Intervention Not Indicated      Goals Addressed            This  Visit's Progress   . Pharmacy Care Plan       CARE PLAN ENTRY (see longitudinal plan of care for additional care plan information)  Current Barriers:  . Chronic Disease Management support, education, and care coordination needs related to Hypertension, Hyperlipidemia, Osteoporosis, and Osteoarthritis   Hypertension BP Readings from Last 3 Encounters:  07/23/19 127/69  03/27/19 138/64    06/27/18 (!) 171/85 .  Pharmacist Clinical Goal(s): o Over the next 60 days, patient will work with PharmD and providers to maintain BP goal <140/90 . Current regimen:  o Atenolol 25 mg - 1/2 tab daily o Amlodipine 5 mg twice a day o Hydralazine 10 mg 3 times daily . Interventions: o Discussed BP goals and benefits of medication for prevention of heart attack / stroke o Discussed low blood pressure (<110/60) can contribute to dizziness, lightheadedness. Recommended to monitor blood pressure at home to check for low blood pressures . Patient self care activities - Over the next 90 days, patient will: o Check BP as needed, document, and provide at future appointments o Ensure daily salt intake < 2300 mg/day  Hyperlipidemia Lab Results  Component Value Date/Time   LDLCALC 162 (H) 03/27/2019 02:49 PM   LDLDIRECT 176.0 03/24/2017 11:35 AM .  Pharmacist Clinical Goal(s): o Over the next 90 days, patient will work with PharmD and providers to achieve LDL goal < 70 . Current regimen:  o Ezetimibe 10 mg daily o Nature-Made Cholestoff . Interventions: o Discussed cholesterol goals and benefits of medication for prevention of heart attack / stroke . Patient self care activities - Over the next 90 days, patient will: o Continue medications as prescribed o Reduce cholesterol in diet (see attached)  Osteoporosis . Pharmacist Clinical Goal(s) o Over the next 90 days, patient will work with PharmD and providers to optimize bone health . Current regimen:  o Calcium 1200 mg- Vitamin D 1000 units daily . Interventions: o Discussed benefits of calcium/Vitamin D for bone health to prevent fractures o Recommended to schedule DEXA scan (ordered in Nov 2020 but never scheduled) . Patient self care activities - Over the next 90 days, patient will: o Continue calcium and Vitamin D supplements o Follow up with PCP office to schedule DEXA bone scan  Osteoarthritis . Pharmacist Clinical  Goal(s) o Over the next 90 days, patient will work with PharmD and providers to optimize therapy . Current regimen:  o Advil Arthritis . Interventions: o Discussed Advil can damage the kidneys; recommend Tylenol Arthritis instead as it is safe for kidneys . Patient self care activities - Over the next 90 days, patient will: o Take Tylenol Arthritis up to 4 tablets daily for arthritis pain o Avoid NSAIDs - Advil (ibuprofen) and Aleve (naproxen) due to kidneys  Medication management . Pharmacist Clinical Goal(s): o Over the next 90 days, patient will work with PharmD and providers to maintain optimal medication adherence . Current pharmacy: [facility] . Interventions o Comprehensive medication review performed. o Continue current medication management strategy . Patient self care activities - Over the next 90 days, patient will: o Focus on medication adherence by pill box o Take medications as prescribed o Report any questions or concerns to PharmD and/or provider(s)  Initial goal documentation       AFIB / Hypertension   Remote hx paroxysmal Afib ~2012. Never on anticoagulation. Patient is currently rate controlled. Office heart rates are  Pulse Readings from Last 3 Encounters:  07/23/19 (!) 58  03/27/19 70  06/27/18  78   CHA2DS2-VASc Score = 5  The patient's score is based upon: CHF History: 0 HTN History: 1 Age : 2 Diabetes History: 0 Stroke History: 0 Vascular Disease History: 1 Gender: 1  BP goal is:  <140/90  Office blood pressures are  BP Readings from Last 3 Encounters:  07/23/19 127/69  03/27/19 138/64  06/27/18 (!) 171/85   Kidney Function Lab Results  Component Value Date/Time   CREATININE 1.28 (H) 03/27/2019 02:49 PM   CREATININE 1.79 (H) 03/01/2018 11:47 AM   CREATININE 0.89 04/02/2015 07:20 AM   CREATININE 0.93 10/04/2014 07:20 AM   GFR 39.86 (L) 03/27/2019 02:49 PM   GFRNONAA 25 (L) 05/08/2015 02:48 AM   GFRAA 29 (L) 05/08/2015  02:48 AM   K 4.2 03/27/2019 02:49 PM   K 4.0 03/01/2018 11:47 AM   Patient checks BP at home infrequently Patient home BP readings are ranging: n/a  Patient has failed these meds in past: losartan Patient is currently controlled on the following medications:  . Atenolol 25 mg - 1/2 tab daily . Amlodipine 5 mg BID . Hydralazine 10 mg TID  We discussed:  diet and exercise extensively; pt reports occasional dizziness/lightheadedness, denies falls or syncope. Discussed benefits of checking BP at home to make sure BP is not too low. Pt will begin monitoring when feeling dizzy. Also emphasized adequate hydration/PO intake to prevent hypotension.  Plan  Continue current medications and control with diet and exercise  Monitor BP at home with dizziness  Hyperlipidemia   Noted CAD, PVD. Per chart cannot find evidence of CAD.  ABI 07/23/2019 with evidence of PVD in both lower extremities. LDL goal < 70  Lipid Panel     Component Value Date/Time   CHOL 239 (H) 03/27/2019 1449   TRIG 157.0 (H) 03/27/2019 1449   HDL 45.80 03/27/2019 1449   LDLCALC 162 (H) 03/27/2019 1449   LDLDIRECT 176.0 03/24/2017 1135    Hepatic Function Latest Ref Rng & Units 03/27/2019 09/21/2017 03/24/2017  Total Protein 6.0 - 8.3 g/dL 7.3 7.1 7.5  Albumin 3.5 - 5.2 g/dL 4.3 3.9 4.2  AST 0 - 37 U/L _0 ALT 0 - 35 U/L _1 Alk Phosphatase 39 - 117 U/L 79 74 72  Total Bilirubin 0.2 - 1.2 mg/dL 0.3 0.4 0.5  Bilirubin, Direct 0.0 - 0.3 mg/dL - - -    The ASCVD Risk score ([place]., et al., 2013) failed to calculate for the following reasons:   The 2013 ASCVD risk score is only valid for ages 31 to 3   Patient has failed these meds in past: pravastatin, rosuvastatin, simvastatin Patient is currently uncontrolled on the following medications:  . Ezetimibe 10 mg daily . Nature-Made Cholesterol supplement  We discussed:  Cholesterol goals; benefits of ezetimibe for ASCVD risk reduction; pt started  ezetimibe after most recent cholesterol panel; may continue cholesterol supplement with ezetimibe  Plan  Continue current medications and control with diet and exercise    Osteoporosis   Last DEXA Scan: 09/20/2012  T-Score femoral neck: n/a  T-Score total hip: -1.8  T-Score lumbar spine: -2.3  T-Score forearm radius: -3.2  10-year probability of major osteoporotic fracture: 51.8%  10-year probability of hip fracture: 39.8%  VITD  Date Value Ref Range Status  03/27/2019 26.94 (L) 30.00 - 100.00 ng/mL Final    Patient is a candidate for pharmacologic treatment due to T-Score -1.0 to -2.5 and 10-year risk of major osteoporotic fracture >  20% and T-Score -1.0 to -2.5 and 10-year risk of hip fracture > 3%  Patient has failed these meds in past: n/a Patient is currently uncontrolled on the following medications:  . Calcium-Vitamin D 500-200 mg-IU daily  We discussed:  Recommend [phone] units of vitamin D daily. Recommend 1200 mg of calcium daily from dietary and supplemental sources. DEXA scan was ordered 03/2019 but never scheduled; based on previous DEXA results pt would benefit from bisphosphonate or Prolia. Will await new study before making therapy decisions. -pt was not taking calcium/vitamin D but agreed to restart. She does drink a glass of milk with every meal  Plan  Continue current medications  Recommended to schedule DEXA scan (order expires 2022)  Osteoarthritis   Patient has failed these meds in past: n/a Patient is currently controlled on the following medications:  . Tylenol Arthritis PRN . Advil Arthritis PRN  We discussed:  Pt was taking Advil Arthritis as needed; counseled to avoid NSAIDS due to kidney disease, recommended to take Tylenol Arthritis instead.  Plan  Recommended to stop Advil and take Tylenol instead   Medication Management   Pt uses [facility] for all medications Uses pill box? Yes Pt endorses 100% compliance  We discussed: Pt  has medications delivered from [facility] for a small fee since she does not drive. [facility] is a preferred pharmacy with her insurance. Pt is satisfied with the pharmacy.  Pt has been cutting atenolol using a knife and part of the pill is often crushed. Discussed using pill cutter instead  Plan  Continue current medication management strategy  Recommended pill cutter for atenolol   Follow up: 3 month phone visit  Charlene Brooke, PharmD Clinical Pharmacist [facility] [phone]

## 2019-12-04 ENCOUNTER — Other Ambulatory Visit: Payer: Medicare Other

## 2019-12-18 ENCOUNTER — Ambulatory Visit (INDEPENDENT_AMBULATORY_CARE_PROVIDER_SITE_OTHER)
Admission: RE | Admit: 2019-12-18 | Discharge: 2019-12-18 | Disposition: A | Discharge status: Home / Self Care | Payer: Medicare Other | Source: Ambulatory Visit | Attending: Internal Medicine | Admitting: Internal Medicine

## 2019-12-18 ENCOUNTER — Other Ambulatory Visit: Payer: Self-pay

## 2019-12-18 DIAGNOSIS — M810 Age-related osteoporosis without current pathological fracture: Secondary | ICD-10-CM | POA: Diagnosis not present

## 2019-12-21 ENCOUNTER — Other Ambulatory Visit: Payer: Self-pay | Admitting: Internal Medicine

## 2020-01-22 ENCOUNTER — Other Ambulatory Visit: Payer: Self-pay | Admitting: Internal Medicine

## 2020-02-08 ENCOUNTER — Other Ambulatory Visit: Payer: Self-pay

## 2020-02-08 ENCOUNTER — Ambulatory Visit (INDEPENDENT_AMBULATORY_CARE_PROVIDER_SITE_OTHER): Payer: Medicare Other

## 2020-02-08 VITALS — BP 118/60 | HR 50 | Temp 98.1°F | Resp 16 | Ht 66.0 in | Wt 172.2 lb

## 2020-02-08 DIAGNOSIS — Z0000 Encounter for general adult medical examination without abnormal findings: Secondary | ICD-10-CM

## 2020-02-08 NOTE — Patient Instructions (Signed)
Margaret Foley , Thank you for taking time to come for your Medicare Wellness Visit. I appreciate your ongoing commitment to your health goals. Please review the following plan we discussed and let me know if I can assist you in the future.   Screening recommendations/referrals: Colonoscopy: not a candidate for colon cancer screening due to age 85: no longer required Bone Density: 12/18/2019; due every 2 years Recommended yearly ophthalmology/optometry visit for glaucoma screening and checkup Recommended yearly dental visit for hygiene and checkup  Vaccinations: Influenza vaccine: 04/06/2019 Pneumococcal vaccine: completed Tdap vaccine: never done Shingles vaccine: never done  Covid-19:completed  Advanced directives: Please bring a copy of your health care power of attorney and living will to the office at your convenience.  Conditions/risks identified: Yes; Reviewed health maintenance screenings with patient today and relevant education, vaccines, and/or referrals were provided. Please continue to do your personal lifestyle choices by: daily care of teeth and gums, regular physical activity (goal should be 5 days a week for 30 minutes), eat a healthy diet, avoid tobacco and drug use, limiting any alcohol intake, taking a low-dose aspirin (if not allergic or have been advised by your provider otherwise) and taking vitamins and minerals as recommended by your provider. Continue doing brain stimulating activities (puzzles, reading, adult coloring books, staying active) to keep memory sharp. Continue to eat heart healthy diet (full of fruits, vegetables, whole grains, lean protein, water--limit salt, fat, and sugar intake) and increase physical activity as tolerated.  Next appointment: Please schedule your next Medicare Wellness Visit with your Nurse Health Advisor in 1 year by calling [phone].  Preventive Care 24 Years and Older, Female Preventive care refers to lifestyle choices and  visits with your health care provider that can promote health and wellness. What does preventive care include?  A yearly physical exam. This is also called an annual well check.  Dental exams once or twice a year.  Routine eye exams. Ask your health care provider how often you should have your eyes checked.  Personal lifestyle choices, including:  Daily care of your teeth and gums.  Regular physical activity.  Eating a healthy diet.  Avoiding tobacco and drug use.  Limiting alcohol use.  Practicing safe sex.  Taking low-dose aspirin every day.  Taking vitamin and mineral supplements as recommended by your health care provider. What happens during an annual well check? The services and screenings done by your health care provider during your annual well check will depend on your age, overall health, lifestyle risk factors, and family history of disease. Counseling  Your health care provider may ask you questions about your:  Alcohol use.  Tobacco use.  Drug use.  Emotional well-being.  Home and relationship well-being.  Sexual activity.  Eating habits.  History of falls.  Memory and ability to understand (cognition).  Work and work Statistician.  Reproductive health. Screening  You may have the following tests or measurements:  Height, weight, and BMI.  Blood pressure.  Lipid and cholesterol levels. These may be checked every 5 years, or more frequently if you are over 10 years old.  Skin check.  Lung cancer screening. You may have this screening every year starting at age 36 if you have a 30-pack-year history of smoking and currently smoke or have quit within the past 15 years.  Fecal occult blood test (FOBT) of the stool. You may have this test every year starting at age 80.  Flexible sigmoidoscopy or colonoscopy. You may have a sigmoidoscopy every  5 years or a colonoscopy every 10 years starting at age 109.  Hepatitis C blood test.  Hepatitis B  blood test.  Sexually transmitted disease (STD) testing.  Diabetes screening. This is done by checking your blood sugar (glucose) after you have not eaten for a while (fasting). You may have this done every 1-3 years.  Bone density scan. This is done to screen for osteoporosis. You may have this done starting at age 28.  Mammogram. This may be done every 1-2 years. Talk to your health care provider about how often you should have regular mammograms. Talk with your health care provider about your test results, treatment options, and if necessary, the need for more tests. Vaccines  Your health care provider may recommend certain vaccines, such as:  Influenza vaccine. This is recommended every year.  Tetanus, diphtheria, and acellular pertussis (Tdap, Td) vaccine. You may need a Td booster every 10 years.  Zoster vaccine. You may need this after age 63.  Pneumococcal 13-valent conjugate (PCV13) vaccine. One dose is recommended after age 76.  Pneumococcal polysaccharide (PPSV23) vaccine. One dose is recommended after age 55. Talk to your health care provider about which screenings and vaccines you need and how often you need them. This information is not intended to replace advice given to you by your health care provider. Make sure you discuss any questions you have with your health care provider. Document Released: 05/30/2015 Document Revised: 01/21/2016 Document Reviewed: 03/04/2015 Elsevier Interactive Patient Education  2017 [employer] Prevention in the Home Falls can cause injuries. They can happen to people of all ages. There are many things you can do to make your home safe and to help prevent falls. What can I do on the outside of my home?  Regularly fix the edges of walkways and driveways and fix any cracks.  Remove anything that might make you trip as you walk through a door, such as a raised step or threshold.  Trim any bushes or trees on the path to your  home.  Use bright outdoor lighting.  Clear any walking paths of anything that might make someone trip, such as rocks or tools.  Regularly check to see if handrails are loose or broken. Make sure that both sides of any steps have handrails.  Any raised decks and porches should have guardrails on the edges.  Have any leaves, snow, or ice cleared regularly.  Use sand or salt on walking paths during winter.  Clean up any spills in your garage right away. This includes oil or grease spills. What can I do in the bathroom?  Use night lights.  Install grab bars by the toilet and in the tub and shower. Do not use towel bars as grab bars.  Use non-skid mats or decals in the tub or shower.  If you need to sit down in the shower, use a plastic, non-slip stool.  Keep the floor dry. Clean up any water that spills on the floor as soon as it happens.  Remove soap buildup in the tub or shower regularly.  Attach bath mats securely with double-sided non-slip rug tape.  Do not have throw rugs and other things on the floor that can make you trip. What can I do in the bedroom?  Use night lights.  Make sure that you have a light by your bed that is easy to reach.  Do not use any sheets or blankets that are too big for your bed. They should not  hang down onto the floor.  Have a firm chair that has side arms. You can use this for support while you get dressed.  Do not have throw rugs and other things on the floor that can make you trip. What can I do in the kitchen?  Clean up any spills right away.  Avoid walking on wet floors.  Keep items that you use a lot in easy-to-reach places.  If you need to reach something above you, use a strong step stool that has a grab bar.  Keep electrical cords out of the way.  Do not use floor polish or wax that makes floors slippery. If you must use wax, use non-skid floor wax.  Do not have throw rugs and other things on the floor that can make you  trip. What can I do with my stairs?  Do not leave any items on the stairs.  Make sure that there are handrails on both sides of the stairs and use them. Fix handrails that are broken or loose. Make sure that handrails are as long as the stairways.  Check any carpeting to make sure that it is firmly attached to the stairs. Fix any carpet that is loose or worn.  Avoid having throw rugs at the top or bottom of the stairs. If you do have throw rugs, attach them to the floor with carpet tape.  Make sure that you have a light switch at the top of the stairs and the bottom of the stairs. If you do not have them, ask someone to add them for you. What else can I do to help prevent falls?  Wear shoes that:  Do not have high heels.  Have rubber bottoms.  Are comfortable and fit you well.  Are closed at the toe. Do not wear sandals.  If you use a stepladder:  Make sure that it is fully opened. Do not climb a closed stepladder.  Make sure that both sides of the stepladder are locked into place.  Ask someone to hold it for you, if possible.  Clearly mark and make sure that you can see:  Any grab bars or handrails.  First and last steps.  Where the edge of each step is.  Use tools that help you move around (mobility aids) if they are needed. These include:  Canes.  Walkers.  Scooters.  Crutches.  Turn on the lights when you go into a dark area. Replace any light bulbs as soon as they burn out.  Set up your furniture so you have a clear path. Avoid moving your furniture around.  If any of your floors are uneven, fix them.  If there are any pets around you, be aware of where they are.  Review your medicines with your doctor. Some medicines can make you feel dizzy. This can increase your chance of falling. Ask your doctor what other things that you can do to help prevent falls. This information is not intended to replace advice given to you by your health care provider. Make  sure you discuss any questions you have with your health care provider. Document Released: 02/27/2009 Document Revised: 10/09/2015 Document Reviewed: 06/07/2014 Elsevier Interactive Patient Education  2017 [employer].

## 2020-02-08 NOTE — Progress Notes (Signed)
. Subjective:   Margaret Foley is a 85 y.o. female who presents for Medicare Annual (Subsequent) preventive examination.  Review of Systems    No ROS. Medicare Wellness Visit. Cardiac Risk Factors include: advanced age (>71men, >32 women);dyslipidemia;family history of premature cardiovascular disease;hypertension     Objective:    Today's Vitals   02/08/20 1400  BP: 118/60  Pulse: (!) 50  Resp: 16  Temp: 98.1 F (36.7 C)  SpO2: 97%  Weight: 172 lb 3.2 oz (78.1 kg)  Height: 5\' 6"  (1.676 m)  PainSc: 0-No pain   Body mass index is 27.79 kg/m.  Advanced Directives 02/08/2020 02/02/2019 12/14/2017 12/02/2016 11/30/2016 10/27/2015 07/21/2015  Does Patient Have a Medical Advance Directive? Yes Yes Yes Yes Yes Yes No  Type of Paramedic of [place];Living will [place];Living will [place];Living will [place];Living will [place];Living will -  Does patient want to make changes to medical advance directive? No - Patient declined - - - - - -  Copy of [place] in Chart? No - copy requested No - copy requested No - copy requested No - copy requested - - -  Would patient like information on creating a medical advance directive? - - - - - - -    Current Medications (verified) Outpatient Encounter Medications as of 02/08/2020  Medication Sig  . Acetaminophen (TYLENOL ARTHRITIS PAIN PO) Take by mouth.  [name] Kitchen amLODipine (NORVASC) 5 MG tablet Take 1 tablet (5 mg total) by mouth 2 (two) times daily. Annual appt due in Nov must see provider for future refills  . atenolol (TENORMIN) 25 MG tablet TAKE 1/2 TABLET DAILY.  . calcium-vitamin D (OSCAL WITH D) 500-200 MG-UNIT per tablet Take 1 tablet by mouth daily.    . cyanocobalamin 1000 MCG tablet Take 100 mcg by mouth daily.   [name] Kitchen ezetimibe (ZETIA) 10 MG tablet Take 1 tablet (10 mg total) by mouth daily.   . hydrALAZINE (APRESOLINE) 10 MG tablet Take 1 tablet (10 mg total) by mouth 3 (three) times daily. Annual appt due in Nov must see provider for future refills  . Multiple Vitamins-Minerals (MULTIVITAMIN WITH MINERALS) tablet Take 1 tablet by mouth daily.  . Plant Sterols and Stanols (CHOLESTOFF PO) Take 1 tablet by mouth daily.   No facility-administered encounter medications on file as of 02/08/2020.    Allergies (verified) Penicillins, Aleve [naproxen], Benazepril hcl, Carvedilol, Indomethacin, Pravastatin sodium, Rosuvastatin, Simvastatin, Tramadol, and Advil [ibuprofen]   History: Past Medical History:  Diagnosis Date  . Anxiety   . Atrial fibrillation ([place])   . Barrett's esophagus   . CAD (coronary artery disease)   . Cancer [facility])    Pre cancerous Uterine Tumor  . Carotid artery occlusion   . Chest pain    atypical normal myovue 2008  . COPD (chronic obstructive pulmonary disease) ([place])   . Cough   . Depression   . Fibromyalgia   . GERD (gastroesophageal reflux disease)   . Heart murmur    av sclerosis and mild ar echo 2008  . Hip pain   . History of hiatal hernia   . Hyperlipidemia   . Hypertension   . Insomnia, unspecified   . Kidney stone   . Lumbago   . Myalgia and myositis, unspecified    statin intolerant  . 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 Osteoarthritis   .  Osteopenia   . Other B-complex deficiencies   . Peripheral artery disease ([place]) 12/02/2016  . Peripheral vascular disease ([place])   . Shortness of breath dyspnea   . Tobacco use disorder   . Unspecified vitamin D deficiency    Past Surgical History:  Procedure Laterality Date  . ABDOMINAL AORTIC ANEURYSM REPAIR N/A 05/02/2015   Procedure: REPAIR ABDOMINAL AORTIC ANEURYSM  ,AORTA BI-ILIAC;  Surgeon: Serafina Mitchell, MD;  Location: [place];  Service: Vascular;  Laterality: N/A;  . ABDOMINAL HYSTERECTOMY  1986   Complete  . cataract surgery  2011  .  COLONOSCOPY    . JOINT REPLACEMENT  2012   Right Shoulder and arm   Family History  Problem Relation Age of Onset  . Arthritis Mother   . Heart disease Mother   . Hypertension Mother   . Heart attack Mother   . Hyperlipidemia Other   . Heart disease Son   . Hypertension Son   . Cancer Father   . Hyperlipidemia Sister   . Cancer Brother   . Colon cancer Neg Hx    Social History   Socioeconomic History  . Marital status: Single    Spouse name: Not on file  . Number of children: 2  . Years of education: Not on file  . Highest education level: Not on file  Occupational History  . Occupation: retired  Tobacco Use  . Smoking status: Current Some Day Smoker    Packs/day: 0.25    Years: 57.00    Pack years: 14.25    Types: Cigarettes  . Smokeless tobacco: Never Used  Vaping Use  . Vaping Use: Never used  Substance and Sexual Activity  . Alcohol use: No    Alcohol/week: 0.0 standard drinks  . Drug use: No  . Sexual activity: Not Currently  Other Topics Concern  . Not on file  Social History Narrative   Regular exercise--yes daily stretching.   Social Determinants of Health   Financial Resource Strain: Low Risk   . Difficulty of Paying Living Expenses: Not hard at all  Food Insecurity: No Food Insecurity  . Worried About Charity fundraiser in the Last Year: Never true  . Ran Out of Food in the Last Year: Never true  Transportation Needs: No Transportation Needs  . Lack of Transportation (Medical): No  . Lack of Transportation (Non-Medical): No  Physical Activity: Sufficiently Active  . Days of Exercise per Week: 5 days  . Minutes of Exercise per Session: 30 min  Stress: No Stress Concern Present  . Feeling of Stress : Not at all  Social Connections: Socially Isolated  . Frequency of Communication with Friends and Family: More than three times a week  . Frequency of Social Gatherings with Friends and Family: Once a week  . Attends Religious Services: Never  .  Active Member of Clubs or Organizations: No  . Attends Archivist Meetings: Never  . Marital Status: Widowed    Tobacco Counseling Ready to quit: Not Answered Counseling given: Not Answered   Clinical Intake:  Pre-visit preparation completed: Yes  Pain : No/denies pain Pain Score: 0-No pain     BMI - recorded: 27.79 Nutritional Status: BMI 25 -29 Overweight Nutritional Risks: None Diabetes: No  How often do you need to have someone help you when you read instructions, pamphlets, or other written materials from your doctor or pharmacy?: 1 - Never What is the last grade level you completed in school?: HSG  Diabetic? no  Interpreter Needed?: No  Information entered by :: Oprah Camarena N. Randa Riss, LPN   Activities of Daily Living In your present state of health, do you have any difficulty performing the following activities: 02/08/2020  Hearing? Y  Comment has decreased hearing  Vision? N  Difficulty concentrating or making decisions? N  Walking or climbing stairs? N  Dressing or bathing? N  Doing errands, shopping? N  Preparing Food and eating ? N  Using the Toilet? N  In the past six months, have you accidently leaked urine? Y  Comment wears a mini pad for protection  Do you have problems with loss of bowel control? N  Managing your Medications? N  Managing your Finances? N  Housekeeping or managing your Housekeeping? N  Some recent data might be hidden    Patient Care Team: Binnie Rail, MD as PCP - General (Internal Medicine) Garald Balding, MD (Orthopedic Surgery) Josue Hector, MD (Cardiology) Charlton Haws, [facility] as Pharmacist (Pharmacist)  Indicate any recent Medical Services you may have received from other than [facility] providers in the past year (date may be approximate).     Assessment:   This is a routine wellness examination for Margaret Foley.  Hearing/Vision screen No exam data present  Dietary issues and exercise activities  discussed: Current Exercise Habits: Home exercise routine, Type of exercise: walking;Other - see comments (yard work, gardening, house work), Time (Minutes): 30, Frequency (Times/Week): 5, Weekly Exercise (Minutes/Week): 150, Intensity: Mild, Exercise limited by: orthopedic condition(s);cardiac condition(s);psychological condition(s)  Goals    . Be as active and independent for as long as possible     Continue to be active, spend time with family, and eat as healthy as I can.    . Patient Stated     Lose weight by increasing physical activity and eating healthy.    . Pharmacy Care Plan     CARE PLAN ENTRY (see longitudinal plan of care for additional care plan information)  Current Barriers:  . Chronic Disease Management support, education, and care coordination needs related to Hypertension, Hyperlipidemia, Osteoporosis, and Osteoarthritis   Hypertension BP Readings from Last 3 Encounters:  07/23/19 127/69  03/27/19 138/64  06/27/18 (!) 171/85 .  Pharmacist Clinical Goal(s): o Over the next 60 days, patient will work with PharmD and providers to maintain BP goal <140/90 . Current regimen:  o Atenolol 25 mg - 1/2 tab daily o Amlodipine 5 mg twice a day o Hydralazine 10 mg 3 times daily . Interventions: o Discussed BP goals and benefits of medication for prevention of heart attack / stroke o Discussed low blood pressure (<110/60) can contribute to dizziness, lightheadedness. Recommended to monitor blood pressure at home to check for low blood pressures . Patient self care activities - Over the next 90 days, patient will: o Check BP as needed, document, and provide at future appointments o Ensure daily salt intake < 2300 mg/day  Hyperlipidemia Lab Results  Component Value Date/Time   LDLCALC 162 (H) 03/27/2019 02:49 PM   LDLDIRECT 176.0 03/24/2017 11:35 AM .  Pharmacist Clinical Goal(s): o Over the next 90 days, patient will work with PharmD and providers to achieve LDL goal <  70 . Current regimen:  o Ezetimibe 10 mg daily o Nature-Made Cholestoff . Interventions: o Discussed cholesterol goals and benefits of medication for prevention of heart attack / stroke . Patient self care activities - Over the next 90 days, patient will: o Continue medications as prescribed o Reduce cholesterol  in diet (see attached)  Osteoporosis . Pharmacist Clinical Goal(s) o Over the next 90 days, patient will work with PharmD and providers to optimize bone health . Current regimen:  o Calcium 1200 mg- Vitamin D 1000 units daily . Interventions: o Discussed benefits of calcium/Vitamin D for bone health to prevent fractures o Recommended to schedule DEXA scan (ordered in Nov 2020 but never scheduled) . Patient self care activities - Over the next 90 days, patient will: o Continue calcium and Vitamin D supplements o Follow up with PCP office to schedule DEXA bone scan  Osteoarthritis . Pharmacist Clinical Goal(s) o Over the next 90 days, patient will work with PharmD and providers to optimize therapy . Current regimen:  o Advil Arthritis . Interventions: o Discussed Advil can damage the kidneys; recommend Tylenol Arthritis instead as it is safe for kidneys . Patient self care activities - Over the next 90 days, patient will: o Take Tylenol Arthritis up to 4 tablets daily for arthritis pain o Avoid NSAIDs - Advil (ibuprofen) and Aleve (naproxen) due to kidneys  Medication management . Pharmacist Clinical Goal(s): o Over the next 90 days, patient will work with PharmD and providers to maintain optimal medication adherence . Current pharmacy: [facility] . Interventions o Comprehensive medication review performed. o Recommend using pill cutter for atenolol o Continue current medication management strategy . Patient self care activities - Over the next 90 days, patient will: o Focus on medication adherence by pill box o Request Pill Cutter from [facility] o Take medications as prescribed o Report any questions or concerns to PharmD and/or provider(s)  Initial goal documentation      Depression Screen PHQ 2/9 Scores 02/08/2020 02/02/2019 12/14/2017 12/02/2016 10/07/2015  PHQ - 2 Score 0 1 1 2  0  PHQ- 9 Score - 2 3 3  -    Fall Risk Fall Risk  02/08/2020 03/27/2019 02/02/2019 12/14/2017 12/03/2016  Falls in the past year? 0 0 0 No No  Comment - - - - Emmi Telephone Survey: data to providers prior to load  Number falls in past yr: 0 0 0 - -  Injury with Fall? 0 - 0 - -  Risk for fall due to : No Fall Risks - Impaired balance/gait - -  Follow up Falls evaluation completed - Falls prevention discussed - -    Any stairs in or around the home? No  If so, are there any without handrails? No  Home free of loose throw rugs in walkways, pet beds, electrical cords, etc? Yes  Adequate lighting in your home to reduce risk of falls? Yes   ASSISTIVE DEVICES UTILIZED TO PREVENT FALLS:  Life alert? No  Use of a cane, walker or w/c? No  Grab bars in the bathroom? No  Shower chair or bench in shower? Yes  Elevated toilet seat or a handicapped toilet? No   TIMED UP AND GO:  Was the test performed? No .  Length of time to ambulate 10 feet: 0 sec.   Gait steady and fast without use of assistive device  Cognitive Function: MMSE - Mini Mental State Exam 12/14/2017 12/02/2016  Orientation to time 5 5  Orientation to Place 5 5  Registration 3 3  Attention/ Calculation 4 4  Recall 0 2  Language- name 2 objects 2 2  Language- repeat 1 1  Language- follow 3 step command 3 3  Language- read & follow direction 1 1  Write a sentence 1 1  Copy design 1 1  Total score 26 28     6CIT Screen 02/08/2020 02/02/2019  What Year? 0 points 0 points  What month? 0 points 0 points  What time? 0 points 0 points  Count back from 20 0 points 0 points  Months in reverse 0 points 0 points  Repeat phrase 2 points 2 points  Total Score 2 2     Immunizations Immunization History  Administered Date(s) Administered  . Fluad Quad(high Dose 65+) 03/27/2019  . Influenza Whole 04/05/2006, 04/10/2007, 05/19/2009, 03/06/2010  . Influenza, High Dose Seasonal PF 06/18/2016, 03/24/2017, 03/24/2018  . PFIZER SARS-COV-2 Vaccination 07/10/2019, 07/31/2019  . Pneumococcal Conjugate-13 12/14/2017  . Pneumococcal Polysaccharide-23 02/03/2009    TDAP status: Due, Education has been provided regarding the importance of this vaccine. Advised may receive this vaccine at local pharmacy or Health Dept. Aware to provide a copy of the vaccination record if obtained from local pharmacy or Health Dept. Verbalized acceptance and understanding. Patient declined. Flu Vaccine status: Up to date Pneumococcal vaccine status: Up to date Covid-19 vaccine status: Completed vaccines  Qualifies for Shingles Vaccine? Yes   Zostavax completed Yes   Shingrix Completed?: No.    Education has been provided regarding the importance of this vaccine. Patient has been advised to call insurance company to determine out of pocket expense if they have not yet received this vaccine. Advised may also receive vaccine at local pharmacy or Health Dept. Verbalized acceptance and understanding.  Screening Tests Health Maintenance  Topic Date Due  . TETANUS/TDAP  Never done  . INFLUENZA VACCINE  12/16/2019  . DEXA SCAN  12/17/2021  . COVID-19 Vaccine  Completed  . PNA vac Low Risk Adult  Completed    Health Maintenance  Health Maintenance Due  Topic Date Due  . TETANUS/TDAP  Never done  . INFLUENZA VACCINE  12/16/2019    Colorectal cancer screening: No longer required.  Mammogram status: No longer required.  Bone Density status: Completed 12/18/2019. Results reflect: Bone density results: OSTEOPENIA. Repeat every 2 years.  Lung Cancer Screening: (Low Dose CT Chest recommended if Age 35-80 years, 30 pack-year currently smoking OR have quit w/in 15years.) does not  qualify.   Lung Cancer Screening Referral: no  Additional Screening:  Hepatitis C Screening: does not qualify; Completed no  Vision Screening: Recommended annual ophthalmology exams for early detection of glaucoma and other disorders of the eye. Is the patient up to date with their annual eye exam?  No  Who is the provider or what is the name of the office in which the patient attends annual eye exams? Patient refused referral for eye exam. Stated no problem with eyes. If pt is not established with a provider, would they like to be referred to a provider to establish care? No .   Dental Screening: Recommended annual dental exams for proper oral hygiene  Community Resource Referral / Chronic Care Management: CRR required this visit?  No   CCM required this visit?  No      Plan:     I have personally reviewed and noted the following in the patient's chart:   . Medical and social history . Use of alcohol, tobacco or illicit drugs  . Current medications and supplements . Functional ability and status . Nutritional status . Physical activity . Advanced directives . List of other physicians . Hospitalizations, surgeries, and ER visits in previous 12 months . Vitals . Screenings to include cognitive, depression, and falls . Referrals and  appointments  In addition, I have reviewed and discussed with patient certain preventive protocols, quality metrics, and best practice recommendations. A written personalized care plan for preventive services as well as general preventive health recommendations were provided to patient.     Sheral Flow, LPN   [id number]   Nurse Notes: n/a

## 2020-02-10 ENCOUNTER — Telehealth: Payer: Self-pay | Admitting: Internal Medicine

## 2020-02-10 MED ORDER — AMLODIPINE BESYLATE 5 MG PO TABS: 5.0000 mg | ORAL_TABLET | Freq: Every day | ORAL | 0 refills | Status: DC

## 2020-02-10 NOTE — Telephone Encounter (Signed)
Please call her - she left a copy of her Bp readings from home and had several episodes of lightheadedness.  Lets try decreasing her amlodipine to once a day only and see if that improves.     Med list updated.

## 2020-02-11 NOTE — Telephone Encounter (Signed)
Notified pt w/MD response.../lmb 

## 2020-02-15 ENCOUNTER — Telehealth: Payer: Medicare Other

## 2020-02-15 NOTE — Chronic Care Management (AMB) (Deleted)
Chronic Care Management Pharmacy  Name: Margaret Foley  MRN: [id number] DOB: December 09, 1934   Chief Complaint/ HPI  Margaret Foley,  85 y.o. , female presents for their Follow-Up CCM visit with the clinical pharmacist via telephone due to COVID-19 Pandemic.  PCP : Binnie Rail, MD Patient Care Team: Binnie Rail, MD as PCP - General (Internal Medicine) Garald Balding, MD (Orthopedic Surgery) Josue Hector, MD (Cardiology) Charlton Haws, [facility] as Pharmacist (Pharmacist)  Their chronic conditions include: Hypertension, Hyperlipidemia, Atrial Fibrillation, Coronary Artery Disease, Chronic Kidney Disease, Osteoporosis, Osteoarthritis, Tobacco use, Overactive Bladder and Barrett's esophagus, Peripheral vascular disease  Lived in [place] in the 60s, moved back around 2000. Pt is divorced now. Has 2 sons, 1 in [place], 1 in [place]. 5 grandchildren ages 53s-30s. She is retired from [employer]. Spends most time at home, housework, yardwork. She does not drive. Son usually takes her to doctor's appts, gets groceries, etc. Naps around 2pm every day. Watches movies at night - likes mysteries, whodunnit; crossword puzzles.  Office Visits: 03/27/19 Dr [name] OV: referred to urology for urinary frequency. No desire to quit smoking. DEXA ordered. Started Zetia.  Consult Visit: 07/23/19 Vasc surg OV: s/p AAA repair (2016), no issues, no med changes. 06/21/19 Dr McDiarmid (urology): f/u for urinary frequency/incontinence  Allergies  Allergen Reactions  . Penicillins Rash    Has patient had a PCN reaction causing immediate rash, facial/tongue/throat swelling, SOB or lightheadedness with hypotension: Yes Has patient had a PCN reaction causing severe rash involving mucus membranes or skin necrosis: No Has patient had a PCN reaction that required hospitalization No Has patient had a PCN reaction occurring within the last 10 years: No If all of the above answers are "NO", then may  proceed with Cephalosporin use.   Tori Milks [Naproxen] Other (See Comments)    Caused scar tissue   . Benazepril Hcl     REACTION: cough  . Carvedilol     REACTION: ear buzzing ??  . Indomethacin   . Pravastatin Sodium     REACTION: achy  . Rosuvastatin     REACTION: buzzing  . Simvastatin     REACTION: myalgia  . Tramadol     Ears ringing?  [name] Kitchen Advil [Ibuprofen] Rash    Medications: Outpatient Encounter Medications as of 02/15/2020  Medication Sig  . Acetaminophen (TYLENOL ARTHRITIS PAIN PO) Take by mouth.  [name] Kitchen amLODipine (NORVASC) 5 MG tablet Take 1 tablet (5 mg total) by mouth daily. Annual appt due in Nov must see provider for future refills  . atenolol (TENORMIN) 25 MG tablet TAKE 1/2 TABLET DAILY.  . calcium-vitamin D (OSCAL WITH D) 500-200 MG-UNIT per tablet Take 1 tablet by mouth daily.    . cyanocobalamin 1000 MCG tablet Take 100 mcg by mouth daily.   [name] Kitchen ezetimibe (ZETIA) 10 MG tablet Take 1 tablet (10 mg total) by mouth daily.  . hydrALAZINE (APRESOLINE) 10 MG tablet Take 1 tablet (10 mg total) by mouth 3 (three) times daily. Annual appt due in Nov must see provider for future refills  . Multiple Vitamins-Minerals (MULTIVITAMIN WITH MINERALS) tablet Take 1 tablet by mouth daily.  . Plant Sterols and Stanols (CHOLESTOFF PO) Take 1 tablet by mouth daily.   No facility-administered encounter medications on file as of 02/15/2020.     Current Diagnosis/Assessment:    Goals Addressed   None     AFIB / Hypertension   Remote hx paroxysmal Afib ~2012. Never on anticoagulation.  Patient is currently rate controlled. Office heart rates are  Pulse Readings from Last 3 Encounters:  02/08/20 (!) 50  07/23/19 (!) 58  03/27/19 70   CHA2DS2-VASc Score = 5  The patient's score is based upon: CHF History: 0 HTN History: 1 Age : 2 Diabetes History: 0 Stroke History: 0 Vascular Disease History: 1 Gender: 1  BP goal is:  <140/90  Office blood pressures are  BP Readings  from Last 3 Encounters:  02/08/20 118/60  07/23/19 127/69  03/27/19 138/64   Kidney Function Lab Results  Component Value Date/Time   CREATININE 1.28 (H) 03/27/2019 02:49 PM   CREATININE 1.79 (H) 03/01/2018 11:47 AM   CREATININE 0.89 04/02/2015 07:20 AM   CREATININE 0.93 10/04/2014 07:20 AM   GFR 39.86 (L) 03/27/2019 02:49 PM   GFRNONAA 25 (L) 05/08/2015 02:48 AM   GFRAA 29 (L) 05/08/2015 02:48 AM   K 4.2 03/27/2019 02:49 PM   K 4.0 03/01/2018 11:47 AM   Patient checks BP at home infrequently Patient home BP readings are ranging: n/a  Patient has failed these meds in past: losartan Patient is currently controlled on the following medications:  . Atenolol 25 mg - 1/2 tab daily . Amlodipine 5 mg daily . Hydralazine 10 mg TID  We discussed:  diet and exercise extensively; pt reports occasional dizziness/lightheadedness, denies falls or syncope. Discussed benefits of checking BP at home to make sure BP is not too low. Pt will begin monitoring when feeling dizzy. Also emphasized adequate hydration/PO intake to prevent hypotension.  Plan  Continue current medications and control with diet and exercise  Monitor BP at home with dizziness  Hyperlipidemia   Noted CAD, PVD. Per chart cannot find evidence of CAD.  ABI 07/23/2019 with evidence of PVD in both lower extremities. LDL goal < 70  Lipid Panel     Component Value Date/Time   CHOL 239 (H) 03/27/2019 1449   TRIG 157.0 (H) 03/27/2019 1449   HDL 45.80 03/27/2019 1449   LDLCALC 162 (H) 03/27/2019 1449   LDLDIRECT 176.0 03/24/2017 1135    Hepatic Function Latest Ref Rng & Units 03/27/2019 09/21/2017 03/24/2017  Total Protein 6.0 - 8.3 g/dL 7.3 7.1 7.5  Albumin 3.5 - 5.2 g/dL 4.3 3.9 4.2  AST 0 - 37 U/L _0 ALT 0 - 35 U/L _1 Alk Phosphatase 39 - 117 U/L 79 74 72  Total Bilirubin 0.2 - 1.2 mg/dL 0.3 0.4 0.5  Bilirubin, Direct 0.0 - 0.3 mg/dL - - -    The ASCVD Risk score ([place]., et al., 2013) failed to  calculate for the following reasons:   The 2013 ASCVD risk score is only valid for ages 74 to 49   Patient has failed these meds in past: pravastatin, rosuvastatin, simvastatin Patient is currently uncontrolled on the following medications:  . Ezetimibe 10 mg daily . Nature-Made Cholesterol supplement  We discussed:  Cholesterol goals; benefits of ezetimibe for ASCVD risk reduction; pt started ezetimibe after most recent cholesterol panel; may continue cholesterol supplement with ezetimibe  Plan  Continue current medications and control with diet and exercise    Osteoporosis   Last DEXA Scan: 09/20/2012  T-Score femoral neck: n/a  T-Score total hip: -1.8  T-Score lumbar spine: -2.3  T-Score forearm radius: -3.2  10-year probability of major osteoporotic fracture: 51.8%  10-year probability of hip fracture: 39.8%  VITD  Date Value Ref Range Status  03/27/2019 26.94 (L) 30.00 - 100.00  ng/mL Final    Patient is a candidate for pharmacologic treatment due to T-Score -1.0 to -2.5 and 10-year risk of major osteoporotic fracture > 20% and T-Score -1.0 to -2.5 and 10-year risk of hip fracture > 3%  Patient has failed these meds in past: n/a Patient is currently uncontrolled on the following medications:  . Calcium-Vitamin D 500-200 mg-IU daily  We discussed:  Recommend [phone] units of vitamin D daily. Recommend 1200 mg of calcium daily from dietary and supplemental sources. DEXA scan was ordered 03/2019 but never scheduled; based on previous DEXA results pt would benefit from bisphosphonate or Prolia. Will await new study before making therapy decisions. -pt was not taking calcium/vitamin D but agreed to restart. She does drink a glass of milk with every meal  Plan  Continue current medications  Recommended to schedule DEXA scan (order expires 2022)  Osteoarthritis   Patient has failed these meds in past: n/a Patient is currently controlled on the following medications:   . Tylenol Arthritis PRN . Advil Arthritis PRN  We discussed:  Pt was taking Advil Arthritis as needed; counseled to avoid NSAIDS due to kidney disease, recommended to take Tylenol Arthritis instead.  Plan  Recommended to stop Advil and take Tylenol instead   Medication Management   Pt uses [facility] for all medications Uses pill box? Yes Pt endorses 100% compliance  We discussed: Pt has medications delivered from [facility] for a small fee since she does not drive. [facility] is a preferred pharmacy with her insurance. Pt is satisfied with the pharmacy.  Pt has been cutting atenolol using a knife and part of the pill is often crushed. Discussed using pill cutter instead  Plan  Continue current medication management strategy  Recommended pill cutter for atenolol   Follow up: *** month phone visit  Charlene Brooke, PharmD, BCACP Clinical Pharmacist [facility] [phone]

## 2020-03-14 ENCOUNTER — Other Ambulatory Visit: Payer: Self-pay | Admitting: Internal Medicine

## 2020-04-13 ENCOUNTER — Other Ambulatory Visit: Payer: Self-pay | Admitting: Internal Medicine

## 2020-05-12 ENCOUNTER — Telehealth: Payer: Self-pay | Admitting: Pharmacist

## 2020-05-12 NOTE — Progress Notes (Addendum)
Chronic Care Management Pharmacy Assistant   Name: Margaret Foley  MRN: [id number] DOB: 01-25-1935  Reason for Encounter: General Adherence Call   PCP : Binnie Rail, MD  Allergies:   Allergies  Allergen Reactions   Penicillins Rash    Has patient had a PCN reaction causing immediate rash, facial/tongue/throat swelling, SOB or lightheadedness with hypotension: Yes Has patient had a PCN reaction causing severe rash involving mucus membranes or skin necrosis: No Has patient had a PCN reaction that required hospitalization No Has patient had a PCN reaction occurring within the last 10 years: No If all of the above answers are "NO", then may proceed with Cephalosporin use.    Aleve [Naproxen] Other (See Comments)    Caused scar tissue    Benazepril Hcl     REACTION: cough   Carvedilol     REACTION: ear buzzing ??   Indomethacin    Pravastatin Sodium     REACTION: achy   Rosuvastatin     REACTION: buzzing   Simvastatin     REACTION: myalgia   Tramadol     Ears ringing?   Advil [Ibuprofen] Rash    Medications: Outpatient Encounter Medications as of 05/12/2020  Medication Sig   Acetaminophen (TYLENOL ARTHRITIS PAIN PO) Take by mouth.   amLODipine (NORVASC) 5 MG tablet Take 1 tablet (5 mg total) by mouth daily. Annual appt due in Nov must see provider for future refills   atenolol (TENORMIN) 25 MG tablet TAKE 1/2 TABLET DAILY.   calcium-vitamin D (OSCAL WITH D) 500-200 MG-UNIT per tablet Take 1 tablet by mouth daily.     cyanocobalamin 1000 MCG tablet Take 100 mcg by mouth daily.    ezetimibe (ZETIA) 10 MG tablet TAKE 1 TABLET ONCE DAILY.   hydrALAZINE (APRESOLINE) 10 MG tablet Take 1 tablet (10 mg total) by mouth 3 (three) times daily. Annual appt due in Nov must see provider for future refills   Multiple Vitamins-Minerals (MULTIVITAMIN WITH MINERALS) tablet Take 1 tablet by mouth daily.   Plant Sterols and Stanols (CHOLESTOFF PO) Take 1 tablet by mouth daily.   No  facility-administered encounter medications on file as of 05/12/2020.    Current Diagnosis: Patient Active Problem List   Diagnosis Date Noted   Urinary frequency 03/27/2019   Urinary incontinence 03/27/2019   Microscopic colitis 03/24/2018   Syncope 09/21/2017   Prediabetes 03/24/2017   CKD (chronic kidney disease) 02/23/2016   Dizziness 11/05/2015   LBBB (left bundle branch block) 04/16/2015   AAA (abdominal aortic aneurysm) without rupture ([place]) 04/08/2014   INSOMNIA, CHRONIC 05/19/2009   PAROXYSMAL ATRIAL FIBRILLATION 02/04/2009   CAROTID ARTERY DISEASE 02/04/2009   MURMUR 02/01/2009   HIP PAIN 10/28/2008   Essential hypertension 12/13/2007   PERIPHERAL VASCULAR DISEASE 04/10/2007   VITAMIN B12 DEFICIENCY 01/05/2007   Vitamin D deficiency 01/05/2007   Hyperlipidemia 01/05/2007   Tobacco abuse 01/05/2007   CORONARY ARTERY DISEASE 01/05/2007   OCCLUSION, VERTEBRAL ARTERY W/O INFARCTION 01/05/2007   Barrett's esophagus 01/05/2007   Osteoarthritis 01/05/2007   LOW BACK PAIN 01/05/2007   Osteoporosis 01/05/2007    Goals Addressed   None     Follow-Up:  Pharmacist Review   A general adherence wellness call was made to Margaret Foley to see how she has been doing since her last encounter with the clinical pharmacist Mendel Ryder. The patient states that she was doing fine and that she has an appointment coming up in January to see Dr. [name] and she  would discuss any issues then. Over all the patient said all is well. I let the patient know that I would pass along the information to [facility].   Wendy Poet, Clinical Pharmacist Assistant [facility]  4 minutes spent in review, coordination, and documentation.

## 2020-05-15 ENCOUNTER — Telehealth: Payer: Self-pay | Admitting: Pharmacist

## 2020-05-15 NOTE — Progress Notes (Signed)
° ° °  Chronic Care Management Pharmacy Assistant   Name: Margaret Foley  MRN: [id number] DOB: [birth year]  Reason for Encounter: Chart Review   PCP : Pincus Sanes, MD  Allergies:   Allergies  Allergen Reactions   Penicillins Rash    Has patient had a PCN reaction causing immediate rash, facial/tongue/throat swelling, SOB or lightheadedness with hypotension: Yes Has patient had a PCN reaction causing severe rash involving mucus membranes or skin necrosis: No Has patient had a PCN reaction that required hospitalization No Has patient had a PCN reaction occurring within the last 10 years: No If all of the above answers are "NO", then may proceed with Cephalosporin use.    Aleve [Naproxen] Other (See Comments)    Caused scar tissue    Benazepril Hcl     REACTION: cough   Carvedilol     REACTION: ear buzzing ??   Indomethacin    Pravastatin Sodium     REACTION: achy   Rosuvastatin     REACTION: buzzing   Simvastatin     REACTION: myalgia   Tramadol     Ears ringing?   Advil [Ibuprofen] Rash    Medications: Outpatient Encounter Medications as of 05/15/2020  Medication Sig   Acetaminophen (TYLENOL ARTHRITIS PAIN PO) Take by mouth.   amLODipine (NORVASC) 5 MG tablet Take 1 tablet (5 mg total) by mouth daily. Annual appt due in Nov must see provider for future refills   atenolol (TENORMIN) 25 MG tablet TAKE 1/2 TABLET DAILY.   calcium-vitamin D (OSCAL WITH D) 500-200 MG-UNIT per tablet Take 1 tablet by mouth daily.     cyanocobalamin 1000 MCG tablet Take 100 mcg by mouth daily.    ezetimibe (ZETIA) 10 MG tablet TAKE 1 TABLET ONCE DAILY.   hydrALAZINE (APRESOLINE) 10 MG tablet Take 1 tablet (10 mg total) by mouth 3 (three) times daily. Annual appt due in Nov must see provider for future refills   Multiple Vitamins-Minerals (MULTIVITAMIN WITH MINERALS) tablet Take 1 tablet by mouth daily.   Plant Sterols and Stanols (CHOLESTOFF PO) Take 1 tablet by mouth  daily.   No facility-administered encounter medications on file as of 05/15/2020.    Current Diagnosis: Patient Active Problem List   Diagnosis Date Noted   Urinary frequency 03/27/2019   Urinary incontinence 03/27/2019   Microscopic colitis 03/24/2018   Syncope 09/21/2017   Prediabetes 03/24/2017   CKD (chronic kidney disease) 02/23/2016   Dizziness 11/05/2015   LBBB (left bundle branch block) 04/16/2015   AAA (abdominal aortic aneurysm) without rupture ([facility]) 04/08/2014   INSOMNIA, CHRONIC 05/19/2009   PAROXYSMAL ATRIAL FIBRILLATION 02/04/2009   CAROTID ARTERY DISEASE 02/04/2009   MURMUR 02/01/2009   HIP PAIN 10/28/2008   Essential hypertension 12/13/2007   PERIPHERAL VASCULAR DISEASE 04/10/2007   VITAMIN B12 DEFICIENCY 01/05/2007   Vitamin D deficiency 01/05/2007   Hyperlipidemia 01/05/2007   Tobacco abuse 01/05/2007   CORONARY ARTERY DISEASE 01/05/2007   OCCLUSION, VERTEBRAL ARTERY W/O INFARCTION 01/05/2007   Barrett's esophagus 01/05/2007   Osteoarthritis 01/05/2007   LOW BACK PAIN 01/05/2007   Osteoporosis 01/05/2007    Goals Addressed   None     Follow-Up:  Pharmacist Review   Reviewed chart for medication changes and adherence.   No gaps in adherence identified. Patient has follow up scheduled with pharmacy team. No further action required.  Horald Pollen, Clinical Pharmacist Assistant [facility]

## 2020-05-21 ENCOUNTER — Other Ambulatory Visit: Payer: Self-pay | Admitting: Internal Medicine

## 2020-05-22 NOTE — Progress Notes (Unsigned)
Subjective:    Patient ID: Margaret Foley, female    DOB: 1934/12/16, 86 y.o.   MRN: [id number]  HPI The patient is here for follow up of their chronic medical problems, including htn, hyperlipidemia, CKD, prediabetes, OP, tobacco abuse  Has some problems with her balance.  If she is walking down the hall she will often walk toward one side or the other.  She is not able to reach above her head because she feels like she is going to fall. Has done PT in the past for her balance.  She avoids things that no put her at risk for falling.  She is not exercising regularly.     Medications and allergies reviewed with patient and updated if appropriate.  Patient Active Problem List   Diagnosis Date Noted  . Drug-induced myopathy 05/23/2020  . Poor balance 05/23/2020  . Urinary frequency 03/27/2019  . Urinary incontinence 03/27/2019  . Microscopic colitis 03/24/2018  . Syncope 09/21/2017  . Prediabetes 03/24/2017  . CKD (chronic kidney disease) 02/23/2016  . Dizziness 11/05/2015  . LBBB (left bundle branch block) 04/16/2015  . AAA (abdominal aortic aneurysm) without rupture ([place]) 04/08/2014  . PAROXYSMAL ATRIAL FIBRILLATION 02/04/2009  . CAROTID ARTERY DISEASE 02/04/2009  . HIP PAIN 10/28/2008  . Essential hypertension 12/13/2007  . PERIPHERAL VASCULAR DISEASE 04/10/2007  . B12 deficiency 01/05/2007  . Vitamin D deficiency 01/05/2007  . Hyperlipidemia 01/05/2007  . Tobacco dependence due to cigarettes 01/05/2007  . CORONARY ARTERY DISEASE 01/05/2007  . 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 Barrett's esophagus 01/05/2007  . Osteoarthritis 01/05/2007  . LOW BACK PAIN 01/05/2007  . Osteoporosis 01/05/2007    Current Outpatient Medications on File Prior to Visit  Medication Sig Dispense Refill  . Acetaminophen (TYLENOL ARTHRITIS PAIN PO) Take by mouth.    . calcium-vitamin D (OSCAL WITH D) 500-200 MG-UNIT per tablet Take 1 tablet by mouth daily.      .  Multiple Vitamins-Minerals (MULTIVITAMIN WITH MINERALS) tablet Take 1 tablet by mouth daily.     No current facility-administered medications on file prior to visit.    Past Medical History:  Diagnosis Date  . Anxiety   . Atrial fibrillation ([place])   . Barrett's esophagus   . CAD (coronary artery disease)   . Cancer [facility])    Pre cancerous Uterine Tumor  . Carotid artery occlusion   . Chest pain    atypical normal myovue 2008  . COPD (chronic obstructive pulmonary disease) ([place])   . Cough   . Depression   . Fibromyalgia   . GERD (gastroesophageal reflux disease)   . Heart murmur    av sclerosis and mild ar echo 2008  . Hip pain   . History of hiatal hernia   . Hyperlipidemia   . Hypertension   . Insomnia, unspecified   . Kidney stone   . Lumbago   . Myalgia and myositis, unspecified    statin intolerant  . 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 Osteoarthritis   . Osteopenia   . Other B-complex deficiencies   . Peripheral artery disease ([place]) 12/02/2016  . Peripheral vascular disease ([place])   . Shortness of breath dyspnea   . Tobacco use disorder   . Unspecified vitamin D deficiency     Past Surgical History:  Procedure Laterality Date  . ABDOMINAL AORTIC ANEURYSM REPAIR N/A 05/02/2015   Procedure: REPAIR ABDOMINAL AORTIC ANEURYSM  ,AORTA BI-ILIAC;  Surgeon: Nada Libman, MD;  Location: [facility] OR;  Service: Vascular;  Laterality: N/A;  . ABDOMINAL HYSTERECTOMY  1986   Complete  . cataract surgery  2011  . COLONOSCOPY    . JOINT REPLACEMENT  2012   Right Shoulder and arm    Social History   Socioeconomic History  . Marital status: Single    Spouse name: Not on file  . Number of children: 2  . Years of education: Not on file  . Highest education level: Not on file  Occupational History  . Occupation: retired  Tobacco Use  . Smoking status: Current Some Day Smoker    Packs/day: 0.25    Years: 57.00    Pack  years: 14.25    Types: Cigarettes  . Smokeless tobacco: Never Used  Vaping Use  . Vaping Use: Never used  Substance and Sexual Activity  . Alcohol use: No    Alcohol/week: 0.0 standard drinks  . Drug use: No  . Sexual activity: Not Currently  Other Topics Concern  . Not on file  Social History Narrative   Regular exercise--yes daily stretching.   Social Determinants of Health   Financial Resource Strain: Low Risk   . Difficulty of Paying Living Expenses: Not hard at all  Food Insecurity: No Food Insecurity  . Worried About Programme researcher, broadcasting/film/video in the Last Year: Never true  . Ran Out of Food in the Last Year: Never true  Transportation Needs: No Transportation Needs  . Lack of Transportation (Medical): No  . Lack of Transportation (Non-Medical): No  Physical Activity: Sufficiently Active  . Days of Exercise per Week: 5 days  . Minutes of Exercise per Session: 30 min  Stress: No Stress Concern Present  . Feeling of Stress : Not at all  Social Connections: Socially Isolated  . Frequency of Communication with Friends and Family: More than three times a week  . Frequency of Social Gatherings with Friends and Family: Once a week  . Attends Religious Services: Never  . Active Member of Clubs or Organizations: No  . Attends Banker Meetings: Never  . Marital Status: Widowed    Family History  Problem Relation Age of Onset  . Arthritis Mother   . Heart disease Mother   . Hypertension Mother   . Heart attack Mother   . Hyperlipidemia Other   . Heart disease Son   . Hypertension Son   . Cancer Father   . Hyperlipidemia Sister   . Cancer Brother   . Colon cancer Neg Hx     Review of Systems  Constitutional: Negative for fever.  Eyes: Negative for visual disturbance.  Respiratory: Negative for cough, shortness of breath and wheezing.   Cardiovascular: Negative for chest pain, palpitations and leg swelling.  Musculoskeletal: Positive for arthralgias (r  shoulder).  Neurological: Positive for dizziness (occ - maybe once a day) and light-headedness (occ). Negative for weakness, numbness and headaches.       Objective:   Vitals:   05/23/20 1401  BP: 128/70  Pulse: 60  Temp: 98.1 F (36.7 C)  SpO2: 97%   BP Readings from Last 3 Encounters:  05/23/20 128/70  02/08/20 118/60  07/23/19 127/69   Wt Readings from Last 3 Encounters:  05/23/20 171 lb 6.4 oz (77.7 kg)  02/08/20 172 lb 3.2 oz (78.1 kg)  07/23/19 180 lb (81.6 kg)   Body mass index is 27.66 kg/m.   Physical Exam    Constitutional: Appears well-developed  and well-nourished. No distress.  HENT:  Head: Normocephalic and atraumatic.  Neck: Neck supple. No tracheal deviation present. No thyromegaly present.  No cervical lymphadenopathy Cardiovascular: Normal rate, regular rhythm and normal heart sounds.   No murmur heard. No carotid bruit .  No edema Pulmonary/Chest: Effort normal and breath sounds normal. No respiratory distress. No has no wheezes. No rales.  Skin: Skin is warm and dry. Not diaphoretic.  Psychiatric: Normal mood and affect. Behavior is normal.      Assessment & Plan:    See Problem List for Assessment and Plan of chronic medical problems.    This visit occurred during the SARS-CoV-2 public health emergency.  Safety protocols were in place, including screening questions prior to the visit, additional usage of staff PPE, and extensive cleaning of exam room while observing appropriate contact time as indicated for disinfecting solutions.

## 2020-05-22 NOTE — Patient Instructions (Addendum)
Consider going physical therapy for your balance.   Monitor your BP at home.   Monitor your dizziness/lightheadedness   Blood work was ordered.     Medications changes include :   none  Your prescription(s) have been submitted to your pharmacy. Please take as directed and contact our office if you believe you are having problem(s) with the medication(s).    Please followup in 6 months

## 2020-05-23 ENCOUNTER — Encounter: Payer: Self-pay | Admitting: Internal Medicine

## 2020-05-23 ENCOUNTER — Ambulatory Visit (INDEPENDENT_AMBULATORY_CARE_PROVIDER_SITE_OTHER): Payer: Medicare HMO | Admitting: Internal Medicine

## 2020-05-23 ENCOUNTER — Encounter (INDEPENDENT_AMBULATORY_CARE_PROVIDER_SITE_OTHER): Payer: Self-pay

## 2020-05-23 ENCOUNTER — Telehealth: Payer: Self-pay | Admitting: Internal Medicine

## 2020-05-23 ENCOUNTER — Other Ambulatory Visit: Payer: Self-pay

## 2020-05-23 VITALS — BP 128/70 | HR 60 | Temp 98.1°F | Ht 66.0 in | Wt 171.4 lb

## 2020-05-23 DIAGNOSIS — R7303 Prediabetes: Secondary | ICD-10-CM

## 2020-05-23 DIAGNOSIS — M609 Myositis, unspecified: Secondary | ICD-10-CM | POA: Insufficient documentation

## 2020-05-23 DIAGNOSIS — I10 Essential (primary) hypertension: Secondary | ICD-10-CM

## 2020-05-23 DIAGNOSIS — R2689 Other abnormalities of gait and mobility: Secondary | ICD-10-CM

## 2020-05-23 DIAGNOSIS — E782 Mixed hyperlipidemia: Secondary | ICD-10-CM

## 2020-05-23 DIAGNOSIS — M810 Age-related osteoporosis without current pathological fracture: Secondary | ICD-10-CM | POA: Diagnosis not present

## 2020-05-23 DIAGNOSIS — F17210 Nicotine dependence, cigarettes, uncomplicated: Secondary | ICD-10-CM

## 2020-05-23 DIAGNOSIS — G720 Drug-induced myopathy: Secondary | ICD-10-CM

## 2020-05-23 DIAGNOSIS — N1832 Chronic kidney disease, stage 3b: Secondary | ICD-10-CM

## 2020-05-23 DIAGNOSIS — E538 Deficiency of other specified B group vitamins: Secondary | ICD-10-CM

## 2020-05-23 DIAGNOSIS — R32 Unspecified urinary incontinence: Secondary | ICD-10-CM

## 2020-05-23 DIAGNOSIS — J449 Chronic obstructive pulmonary disease, unspecified: Secondary | ICD-10-CM | POA: Diagnosis not present

## 2020-05-23 DIAGNOSIS — E559 Vitamin D deficiency, unspecified: Secondary | ICD-10-CM

## 2020-05-23 LAB — COMPREHENSIVE METABOLIC PANEL
ALT: 13 U/L (ref 0–35)
AST: 24 U/L (ref 0–37)
Albumin: 4.3 g/dL (ref 3.5–5.2)
Alkaline Phosphatase: 72 U/L (ref 39–117)
BUN: 37 mg/dL — ABNORMAL HIGH (ref 6–23)
CO2: 28 mEq/L (ref 19–32)
Calcium: 10.2 mg/dL (ref 8.4–10.5)
Chloride: 104 mEq/L (ref 96–112)
Creatinine, Ser: 1.52 mg/dL — ABNORMAL HIGH (ref 0.40–1.20)
GFR: 31.45 mL/min — ABNORMAL LOW (ref >60.00)
Glucose, Bld: 89 mg/dL (ref 70–99)
Potassium: 4.2 mEq/L (ref 3.5–5.1)
Sodium: 140 mEq/L (ref 135–145)
Total Bilirubin: 0.5 mg/dL (ref 0.2–1.2)
Total Protein: 7.5 g/dL (ref 6.0–8.3)

## 2020-05-23 LAB — CBC WITH DIFFERENTIAL/PLATELET
Basophils Absolute: 0 10*3/uL (ref 0.0–0.1)
Basophils Relative: 0.6 % (ref 0.0–3.0)
Eosinophils Absolute: 0.1 10*3/uL (ref 0.0–0.7)
Eosinophils Relative: 1.6 % (ref 0.0–5.0)
HCT: 40.4 % (ref 36.0–46.0)
Hemoglobin: 13.4 g/dL (ref 12.0–15.0)
Lymphocytes Relative: 31.5 % (ref 12.0–46.0)
Lymphs Abs: 2.2 10*3/uL (ref 0.7–4.0)
MCHC: 33.3 g/dL (ref 30.0–36.0)
MCV: 86.8 fl (ref 78.0–100.0)
Monocytes Absolute: 0.7 10*3/uL (ref 0.1–1.0)
Monocytes Relative: 10.1 % (ref 3.0–12.0)
Neutro Abs: 3.9 10*3/uL (ref 1.4–7.7)
Neutrophils Relative %: 56.2 % (ref 43.0–77.0)
Platelets: 201 10*3/uL (ref 150.0–400.0)
RBC: 4.65 Mil/uL (ref 3.87–5.11)
RDW: 14.2 % (ref 11.5–15.5)
WBC: 6.9 10*3/uL (ref 4.0–10.5)

## 2020-05-23 LAB — LIPID PANEL
Cholesterol: 205 mg/dL — ABNORMAL HIGH (ref 0–200)
HDL: 55.1 mg/dL (ref >39.00)
LDL Cholesterol: 121 mg/dL — ABNORMAL HIGH (ref 0–99)
NonHDL: 150.24
Total CHOL/HDL Ratio: 4
Triglycerides: 144 mg/dL (ref 0.0–149.0)
VLDL: 28.8 mg/dL (ref 0.0–40.0)

## 2020-05-23 LAB — HEMOGLOBIN A1C: Hgb A1c MFr Bld: 5.7 % (ref 4.6–6.5)

## 2020-05-23 LAB — VITAMIN D 25 HYDROXY (VIT D DEFICIENCY, FRACTURES): VITD: 29.17 ng/mL — ABNORMAL LOW (ref 30.00–100.00)

## 2020-05-23 LAB — TSH: TSH: 1.5 u[IU]/mL (ref 0.35–4.50)

## 2020-05-23 LAB — VITAMIN B12: Vitamin B-12: 287 pg/mL (ref 211–911)

## 2020-05-23 MED ORDER — MIRABEGRON ER 25 MG PO TB24: 25.0000 mg | ORAL_TABLET | Freq: Every day | ORAL | 5 refills | Status: DC

## 2020-05-23 MED ORDER — AMLODIPINE BESYLATE 5 MG PO TABS: 5.0000 mg | ORAL_TABLET | Freq: Every day | ORAL | 1 refills | Status: DC

## 2020-05-23 MED ORDER — EZETIMIBE 10 MG PO TABS
10.0000 mg | ORAL_TABLET | Freq: Every day | ORAL | 1 refills | Status: DC
Start: 2020-05-23 — End: 2021-07-01

## 2020-05-23 MED ORDER — HYDRALAZINE HCL 10 MG PO TABS: 10.0000 mg | ORAL_TABLET | Freq: Three times a day (TID) | ORAL | 5 refills | Status: DC

## 2020-05-23 MED ORDER — ATENOLOL 25 MG PO TABS: 12.5000 mg | ORAL_TABLET | Freq: Every day | ORAL | 1 refills | Status: DC

## 2020-05-23 NOTE — Assessment & Plan Note (Signed)
Chronic Check B12 level Taking multivitamin

## 2020-05-23 NOTE — Assessment & Plan Note (Signed)
Chronic Check lipid panel  Continue Zetia 10 mg daily due to statin intolerance because of myalgia Regular exercise and healthy diet encouraged

## 2020-05-23 NOTE — Assessment & Plan Note (Signed)
History of myopathy secondary to statins Statin intolerant On Zetia

## 2020-05-23 NOTE — Assessment & Plan Note (Signed)
Chronic Check a1c Low sugar / carb diet Stressed regular exercise  

## 2020-05-23 NOTE — Assessment & Plan Note (Signed)
Chronic No desire to quit

## 2020-05-23 NOTE — Assessment & Plan Note (Signed)
Chronic dexa up to date Stressed regular exercise Taking calcium and vitamin d daily

## 2020-05-23 NOTE — Assessment & Plan Note (Signed)
Acute She notes her balance is not as good and is at risk for falling She has identified several things that could cause falling and she has changed things in her house and does not do certain things to prevent falls Discussed physical therapy and balance exercises-she may consider going back to physical therapy that did help in the past Stressed regular exercise Continue to avoid situations that may increase risk of falls

## 2020-05-23 NOTE — Assessment & Plan Note (Signed)
Chronic She did call today after her visit stating that the incontinence was worse and she did monitor her medication We will prescribe Myrbetriq 25 mg daily-advised that if this does not work or she needs a higher dose she should follow-up so we can discuss further

## 2020-05-23 NOTE — Assessment & Plan Note (Signed)
Chronic CMP 

## 2020-05-23 NOTE — Telephone Encounter (Signed)
  Patient was seen today, she forgot to mention she has become incontinent. She states she wears incontinence pads, but the issue is getting worse She would like medication  prescribed

## 2020-05-23 NOTE — Assessment & Plan Note (Signed)
Chronic BP well controlled Continue amlodipine 5 mg daily, atenolol 12.5 mg daily, hydralazine 10 mg 3 times daily cmp, CBC  Advise her to monitor her BP at home to make sure that it is not dropping and causing some of her lightheadedness/dizziness.  She states when she does check it is typically around 859 systolically

## 2020-05-23 NOTE — Telephone Encounter (Signed)
I sent a prescription to her pharmacy to try.  If this does not work or she needs a higher dose she should come in because there are side effects we need to discuss.

## 2020-05-23 NOTE — Assessment & Plan Note (Signed)
Chronic Taking vitamin D daily Check vitamin D level  

## 2020-05-23 NOTE — Telephone Encounter (Signed)
Spoke with patient today. 

## 2020-06-27 ENCOUNTER — Other Ambulatory Visit: Payer: Self-pay | Admitting: Internal Medicine

## 2020-07-22 ENCOUNTER — Telehealth: Payer: Self-pay | Admitting: Pharmacist

## 2020-07-22 NOTE — Progress Notes (Unsigned)
Chronic Care Management Pharmacy Assistant   Name: Margaret Foley  MRN: [id number] DOB: Oct 19, 1934  Reason for Encounter: Disease State-Hypertension   Conditions to be addressed/monitored: HTN   Medications: Outpatient Encounter Medications as of 07/22/2020  Medication Sig  . Acetaminophen (TYLENOL ARTHRITIS PAIN PO) Take by mouth.  [name] Kitchen amLODipine (NORVASC) 5 MG tablet Take 1 tablet (5 mg total) by mouth daily.  [name] Kitchen atenolol (TENORMIN) 25 MG tablet Take 0.5 tablets (12.5 mg total) by mouth daily.  . calcium-vitamin D (OSCAL WITH D) 500-200 MG-UNIT per tablet Take 1 tablet by mouth daily.    [name] Kitchen ezetimibe (ZETIA) 10 MG tablet Take 1 tablet (10 mg total) by mouth daily.  . hydrALAZINE (APRESOLINE) 10 MG tablet Take 1 tablet (10 mg total) by mouth 3 (three) times daily.  . mirabegron ER (MYRBETRIQ) 25 MG TB24 tablet Take 1 tablet (25 mg total) by mouth daily.  . Multiple Vitamins-Minerals (MULTIVITAMIN WITH MINERALS) tablet Take 1 tablet by mouth daily.   No facility-administered encounter medications on file as of 07/22/2020.      Reviewed chart prior to disease state call. Spoke with patient regarding BP  Recent Office Vitals: BP Readings from Last 3 Encounters:  05/23/20 128/70  02/08/20 118/60  07/23/19 127/69   Pulse Readings from Last 3 Encounters:  05/23/20 60  02/08/20 (!) 50  07/23/19 (!) 58    Wt Readings from Last 3 Encounters:  05/23/20 171 lb 6.4 oz (77.7 kg)  02/08/20 172 lb 3.2 oz (78.1 kg)  07/23/19 180 lb (81.6 kg)     Kidney Function Lab Results  Component Value Date/Time   CREATININE 1.52 (H) 05/23/2020 02:50 PM   CREATININE 1.28 (H) 03/27/2019 02:49 PM   CREATININE 0.89 04/02/2015 07:20 AM   CREATININE 0.93 10/04/2014 07:20 AM   GFR 31.45 (L) 05/23/2020 02:50 PM   GFRNONAA 25 (L) 05/08/2015 02:48 AM   GFRAA 29 (L) 05/08/2015 02:48 AM    BMP Latest Ref Rng & Units 05/23/2020 03/27/2019 03/01/2018  Glucose 70 - 99 mg/dL 89 109(H) 107(H)  BUN 6 - 23  mg/dL 37(H) 32(H) 37(H)  Creatinine 0.40 - 1.20 mg/dL 1.52(H) 1.28(H) 1.79(H)  Sodium 135 - 145 mEq/L 140 141 141  Potassium 3.5 - 5.1 mEq/L 4.2 4.2 4.0  Chloride 96 - 112 mEq/L 104 108 108  CO2 19 - 32 mEq/L 28 26 25   Calcium 8.4 - 10.5 mg/dL 10.2 9.5 9.9    . Current antihypertensive regimen: -amLODipine (NORVASC) 5 MG tablet -atenolol (TENORMIN) 25 MG tablet -hydrALAZINE (APRESOLINE) 10 MG tablet   . How often are you checking your Blood Pressure? {CHL HP BP Monitoring Frequency:[phone]}    . Current home BP readings: ***  . What recent interventions/DTPs have been made by any provider to improve Blood Pressure control .  since last CPP Visit:  05/23/20 OV (Stacy Burns,MD) Patient was seen for a follow-up.. Patient was advised to monitor blood pressure at home to make sure it is not dropping and causing some of her lightheadedness/dizziness.  . Any recent hospitalizations or ED visits since last visit with CPP? No  .  . What diet changes have been made to improve Blood Pressure Control?  o *** . What exercise is being done to improve your Blood Pressure Control?  o ***  Adherence Review: Is the patient currently on ACE/ARB medication? No Does the patient have >5 day gap between last estimated fill dates? Yes   -amLODipine (NORVASC) 5 MG tablet last  fill date 01/22/20 90D -atenolol (TENORMIN) 25 MG tablet last fill date 04/14/20 Marvin   Georgiana Shore ,[place] Pharmacist Assistant [phone]

## 2020-09-04 ENCOUNTER — Other Ambulatory Visit: Payer: Self-pay | Admitting: Internal Medicine

## 2020-10-07 ENCOUNTER — Telehealth: Payer: Self-pay | Admitting: Pharmacist

## 2020-10-07 NOTE — Progress Notes (Addendum)
Chronic Care Management Pharmacy Assistant   Name: Margaret Foley  MRN: [id number] DOB: 1935-05-16  Reason for Encounter: Disease State   Conditions to be addressed/monitored: HTN  Recent office visits:  05/23/20 Burns (PCP) - F/u Hypertension. Start Amlodipine 5mg . D/c Cyancobalamin & Plant sterols and stanols. Consider PT. F/u 6 mos.  Recent consult visits:  None noted  Hospital visits:  None in previous 6 months  Medications: Outpatient Encounter Medications as of 10/07/2020  Medication Sig  . Acetaminophen (TYLENOL ARTHRITIS PAIN PO) Take by mouth.  [name] Kitchen amLODipine (NORVASC) 5 MG tablet TAKE 1 TABLET BY MOUTH TWICE DAILY.  [name] Kitchen atenolol (TENORMIN) 25 MG tablet Take 0.5 tablets (12.5 mg total) by mouth daily.  . calcium-vitamin D (OSCAL WITH D) 500-200 MG-UNIT per tablet Take 1 tablet by mouth daily.    [name] Kitchen ezetimibe (ZETIA) 10 MG tablet Take 1 tablet (10 mg total) by mouth daily.  . hydrALAZINE (APRESOLINE) 10 MG tablet Take 1 tablet (10 mg total) by mouth 3 (three) times daily.  . mirabegron ER (MYRBETRIQ) 25 MG TB24 tablet Take 1 tablet (25 mg total) by mouth daily.  . Multiple Vitamins-Minerals (MULTIVITAMIN WITH MINERALS) tablet Take 1 tablet by mouth daily.   No facility-administered encounter medications on file as of 10/07/2020.   Reviewed chart prior to disease state call. Spoke with patient regarding BP  Recent Office Vitals: BP Readings from Last 3 Encounters:  05/23/20 128/70  02/08/20 118/60  07/23/19 127/69   Pulse Readings from Last 3 Encounters:  05/23/20 60  02/08/20 (!) 50  07/23/19 (!) 58    Wt Readings from Last 3 Encounters:  05/23/20 171 lb 6.4 oz (77.7 kg)  02/08/20 172 lb 3.2 oz (78.1 kg)  07/23/19 180 lb (81.6 kg)     Kidney Function Lab Results  Component Value Date/Time   CREATININE 1.52 (H) 05/23/2020 02:50 PM   CREATININE 1.28 (H) 03/27/2019 02:49 PM   CREATININE 0.89 04/02/2015 07:20 AM   CREATININE 0.93 10/04/2014 07:20 AM   GFR  31.45 (L) 05/23/2020 02:50 PM   GFRNONAA 25 (L) 05/08/2015 02:48 AM   GFRAA 29 (L) 05/08/2015 02:48 AM    BMP Latest Ref Rng & Units 05/23/2020 03/27/2019 03/01/2018  Glucose 70 - 99 mg/dL 89 109(H) 107(H)  BUN 6 - 23 mg/dL 37(H) 32(H) 37(H)  Creatinine 0.40 - 1.20 mg/dL 1.52(H) 1.28(H) 1.79(H)  Sodium 135 - 145 mEq/L 140 141 141  Potassium 3.5 - 5.1 mEq/L 4.2 4.2 4.0  Chloride 96 - 112 mEq/L 104 108 108  CO2 19 - 32 mEq/L 28 26 25   Calcium 8.4 - 10.5 mg/dL 10.2 9.5 9.9    . Current antihypertensive regimen:  ? Atenolol 25 mg - 1/2 tab daily ? Amlodipine 5 mg twice a day ? Hydralazine 10 mg 3 times daily  . How often are you checking your Blood Pressure?   Patient states she have not been checking her BP but will  start on Sunday and keep a log of her readings.  . Current home BP readings: None  . What recent interventions/DTPs have been made by any provider to improve Blood Pressure control since last CPP Visit:   None noted   . Any recent hospitalizations or ED visits since last visit with CPP? No   . What diet changes have been made to improve Blood Pressure Control?  o Patient states she tries to eat healthy and watches what she eats.  . What exercise is being done  to improve your Blood Pressure Control?  o Patient states she walks and do yard work for exercise.  Adherence Review: Is the patient currently on ACE/ARB medication? No Does the patient have >5 day gap between last estimated fill dates? No ? Atenolol  - last fill 04/14/20 90D (needs refill) ? Amlodipine - last fill 01/22/20 90D (pt states filled 09/04/20) ? Hydralazine - last fill 05/23/20 90D (needs refill)  Patient states she needs to call in refills on her Atenolol and Hydralazine and thought she had to see her provider to refill these prescriptions, informed patient that these meds had refills left on them.  Called pharmacy and requested refills for Atenolol and Hydralazine, they will deliver on 10/10/20 on  morning route.  Star Rating Drugs: N/A  Orinda Kenner, RMA Clinical Pharmacists Assistant [phone]  Time Spent: [phone]

## 2020-11-18 ENCOUNTER — Telehealth: Payer: Self-pay | Admitting: Pharmacist

## 2020-11-18 NOTE — Progress Notes (Unsigned)
    Chronic Care Management Pharmacy Assistant   Name: Margaret Foley  MRN: [id number] DOB: 11-Mar-1935    Reason for Encounter: Disease State Hypertension    Recent office visits:  No visits noted  Recent consult visits:  No visits noted  Hospital visits:  None in previous 6 months  Medications: Outpatient Encounter Medications as of 11/18/2020  Medication Sig   Acetaminophen (TYLENOL ARTHRITIS PAIN PO) Take by mouth.   amLODipine (NORVASC) 5 MG tablet TAKE 1 TABLET BY MOUTH TWICE DAILY.   atenolol (TENORMIN) 25 MG tablet Take 0.5 tablets (12.5 mg total) by mouth daily.   calcium-vitamin D (OSCAL WITH D) 500-200 MG-UNIT per tablet Take 1 tablet by mouth daily.     ezetimibe (ZETIA) 10 MG tablet Take 1 tablet (10 mg total) by mouth daily.   hydrALAZINE (APRESOLINE) 10 MG tablet Take 1 tablet (10 mg total) by mouth 3 (three) times daily.   mirabegron ER (MYRBETRIQ) 25 MG TB24 tablet Take 1 tablet (25 mg total) by mouth daily.   Multiple Vitamins-Minerals (MULTIVITAMIN WITH MINERALS) tablet Take 1 tablet by mouth daily.   No facility-administered encounter medications on file as of 11/18/2020.   Reviewed chart prior to disease state call. Spoke with patient regarding BP  Recent Office Vitals: BP Readings from Last 3 Encounters:  05/23/20 128/70  02/08/20 118/60  07/23/19 127/69   Pulse Readings from Last 3 Encounters:  05/23/20 60  02/08/20 (!) 50  07/23/19 (!) 58    Wt Readings from Last 3 Encounters:  05/23/20 171 lb 6.4 oz (77.7 kg)  02/08/20 172 lb 3.2 oz (78.1 kg)  07/23/19 180 lb (81.6 kg)     Kidney Function Lab Results  Component Value Date/Time   CREATININE 1.52 (H) 05/23/2020 02:50 PM   CREATININE 1.28 (H) 03/27/2019 02:49 PM   CREATININE 0.89 04/02/2015 07:20 AM   CREATININE 0.93 10/04/2014 07:20 AM   GFR 31.45 (L) 05/23/2020 02:50 PM   GFRNONAA 25 (L) 05/08/2015 02:48 AM   GFRAA 29 (L) 05/08/2015 02:48 AM    BMP Latest Ref Rng & Units 05/23/2020  03/27/2019 03/01/2018  Glucose 70 - 99 mg/dL 89 109(H) 107(H)  BUN 6 - 23 mg/dL 37(H) 32(H) 37(H)  Creatinine 0.40 - 1.20 mg/dL 1.52(H) 1.28(H) 1.79(H)  Sodium 135 - 145 mEq/L 140 141 141  Potassium 3.5 - 5.1 mEq/L 4.2 4.2 4.0  Chloride 96 - 112 mEq/L 104 108 108  CO2 19 - 32 mEq/L 28 26 25   Calcium 8.4 - 10.5 mg/dL 10.2 9.5 9.9    Current antihypertensive regimen:  Atenolol 25 mg - 1/2 tab daily Amlodipine 5 mg twice a day Hydralazine 10 mg 3 times daily  How often are you checking your Blood Pressure? {CHL HP BP Monitoring [phone]}  Current home BP readings: ***  What recent interventions/DTPs have been made by any provider to improve Blood Pressure control since last CPP Visit: ***  Any recent hospitalizations or ED visits since last visit with CPP? No  What diet changes have been made to improve Blood Pressure Control?  *** What exercise is being done to improve your Blood Pressure Control?   ***  Adherence Review: Is the patient currently on ACE/ARB medication? Yes Does the patient have >5 day gap between last estimated fill dates? {yes/no:20286}   [place] Rating Drugs: Losartan Potassium 50 MG 01/09/18 90DS Ezetimibe 10 MG 06/27/20 30 DS  Georgiana Shore ,[place] Pharmacist Assistant [phone]

## 2020-11-26 ENCOUNTER — Encounter: Payer: Self-pay | Admitting: Internal Medicine

## 2020-11-26 ENCOUNTER — Other Ambulatory Visit: Payer: Self-pay

## 2020-11-26 ENCOUNTER — Ambulatory Visit (INDEPENDENT_AMBULATORY_CARE_PROVIDER_SITE_OTHER): Payer: Medicare HMO | Admitting: Internal Medicine

## 2020-11-26 VITALS — BP 132/78 | HR 68 | Temp 98.1°F | Ht 66.0 in | Wt 171.0 lb

## 2020-11-26 DIAGNOSIS — R5381 Other malaise: Secondary | ICD-10-CM | POA: Diagnosis not present

## 2020-11-26 DIAGNOSIS — R7303 Prediabetes: Secondary | ICD-10-CM | POA: Diagnosis not present

## 2020-11-26 DIAGNOSIS — I10 Essential (primary) hypertension: Secondary | ICD-10-CM | POA: Diagnosis not present

## 2020-11-26 DIAGNOSIS — E782 Mixed hyperlipidemia: Secondary | ICD-10-CM | POA: Diagnosis not present

## 2020-11-26 DIAGNOSIS — N1832 Chronic kidney disease, stage 3b: Secondary | ICD-10-CM | POA: Diagnosis not present

## 2020-11-26 DIAGNOSIS — N3281 Overactive bladder: Secondary | ICD-10-CM

## 2020-11-26 DIAGNOSIS — R2689 Other abnormalities of gait and mobility: Secondary | ICD-10-CM

## 2020-11-26 LAB — COMPREHENSIVE METABOLIC PANEL
ALT: 12 U/L (ref 0–35)
AST: 27 U/L (ref 0–37)
Albumin: 4.2 g/dL (ref 3.5–5.2)
Alkaline Phosphatase: 76 U/L (ref 39–117)
BUN: 32 mg/dL — ABNORMAL HIGH (ref 6–23)
CO2: 28 mEq/L (ref 19–32)
Calcium: 9.8 mg/dL (ref 8.4–10.5)
Chloride: 107 mEq/L (ref 96–112)
Creatinine, Ser: 1.25 mg/dL — ABNORMAL HIGH (ref 0.40–1.20)
GFR: 39.63 mL/min — ABNORMAL LOW (ref >60.00)
Glucose, Bld: 94 mg/dL (ref 70–99)
Potassium: 4.3 mEq/L (ref 3.5–5.1)
Sodium: 142 mEq/L (ref 135–145)
Total Bilirubin: 0.5 mg/dL (ref 0.2–1.2)
Total Protein: 7.3 g/dL (ref 6.0–8.3)

## 2020-11-26 LAB — HEMOGLOBIN A1C: Hgb A1c MFr Bld: 6 % (ref 4.6–6.5)

## 2020-11-26 MED ORDER — NEBIVOLOL HCL 2.5 MG PO TABS: 2.5000 mg | ORAL_TABLET | Freq: Every day | ORAL | 3 refills | Status: DC

## 2020-11-26 NOTE — Progress Notes (Signed)
Subjective:    Patient ID: Margaret Foley, female    DOB: 1934/08/05, 86 y.o.   MRN: [id number]  HPI The patient is here for follow up of their chronic medical problems, including htn, hld, ckd, prediabetes, OP, tobacco abuse, OAB  ? Add extra vitamin d after last visit  Medications and allergies reviewed with patient and updated if appropriate.  Patient Active Problem List   Diagnosis Date Noted   Drug-induced myopathy 05/23/2020   Poor balance 05/23/2020   Overactive bladder 03/27/2019   Urinary incontinence 03/27/2019   Microscopic colitis 03/24/2018   Syncope 09/21/2017   Prediabetes 03/24/2017   CKD (chronic kidney disease) 02/23/2016   Dizziness 11/05/2015   LBBB (left bundle branch block) 04/16/2015   AAA (abdominal aortic aneurysm) without rupture ([place]) 04/08/2014   PAROXYSMAL ATRIAL FIBRILLATION 02/04/2009   CAROTID ARTERY DISEASE 02/04/2009   HIP PAIN 10/28/2008   Essential hypertension 12/13/2007   PERIPHERAL VASCULAR DISEASE 04/10/2007   B12 deficiency 01/05/2007   Vitamin D deficiency 01/05/2007   Hyperlipidemia 01/05/2007   Tobacco dependence due to cigarettes 01/05/2007   CORONARY ARTERY DISEASE 01/05/2007   OCCLUSION, VERTEBRAL ARTERY W/O INFARCTION 01/05/2007   Barrett's esophagus 01/05/2007   Osteoarthritis 01/05/2007   LOW BACK PAIN 01/05/2007   Osteoporosis 01/05/2007    Current Outpatient Medications on File Prior to Visit  Medication Sig Dispense Refill   Acetaminophen (TYLENOL ARTHRITIS PAIN PO) Take by mouth.     amLODipine (NORVASC) 5 MG tablet TAKE 1 TABLET BY MOUTH TWICE DAILY. 180 tablet 0   atenolol (TENORMIN) 25 MG tablet Take 0.5 tablets (12.5 mg total) by mouth daily. 45 tablet 1   calcium-vitamin D (OSCAL WITH D) 500-200 MG-UNIT per tablet Take 1 tablet by mouth daily.       ezetimibe (ZETIA) 10 MG tablet Take 1 tablet (10 mg total) by mouth daily. 90 tablet 1   hydrALAZINE (APRESOLINE) 10 MG tablet Take 1 tablet (10 mg total) by  mouth 3 (three) times daily. 90 tablet 5   mirabegron ER (MYRBETRIQ) 25 MG TB24 tablet Take 1 tablet (25 mg total) by mouth daily. 30 tablet 5   Multiple Vitamins-Minerals (MULTIVITAMIN WITH MINERALS) tablet Take 1 tablet by mouth daily.     No current facility-administered medications on file prior to visit.    Past Medical History:  Diagnosis Date   Anxiety    Atrial fibrillation ([place])    Barrett's esophagus    CAD (coronary artery disease)    Cancer (HCC)    Pre cancerous Uterine Tumor   Carotid artery occlusion    Chest pain    atypical normal myovue 2008   COPD (chronic obstructive pulmonary disease) (HCC)    Cough    Depression    Fibromyalgia    GERD (gastroesophageal reflux disease)    Heart murmur    av sclerosis and mild ar echo 2008   Hip pain    History of hiatal hernia    Hyperlipidemia    Hypertension    Insomnia, unspecified    Kidney stone    Lumbago    Myalgia and myositis, unspecified    statin intolerant   Occlusion and stenosis of vertebral artery without mention of cerebral infarction    Osteoarthritis    Osteopenia    Other B-complex deficiencies    Peripheral artery disease ([place]) 12/02/2016   Peripheral vascular disease (HCC)    Shortness of breath dyspnea    Tobacco use disorder  Unspecified vitamin D deficiency     Past Surgical History:  Procedure Laterality Date   ABDOMINAL AORTIC ANEURYSM REPAIR N/A 05/02/2015   Procedure: REPAIR ABDOMINAL AORTIC ANEURYSM  ,AORTA BI-ILIAC;  Surgeon: Serafina Mitchell, MD;  Location: [facility] OR;  Service: Vascular;  Laterality: N/A;   ABDOMINAL HYSTERECTOMY  1986   Complete   cataract surgery  2011   COLONOSCOPY     JOINT REPLACEMENT  2012   Right Shoulder and arm    Social History   Socioeconomic History   Marital status: Single    Spouse name: Not on file   Number of children: 2   Years of education: Not on file   Highest education level: Not on file  Occupational History   Occupation: retired   Tobacco Use   Smoking status: Some Days    Packs/day: 0.25    Years: 57.00    Pack years: 14.25    Types: Cigarettes   Smokeless tobacco: Never  Vaping Use   Vaping Use: Never used  Substance and Sexual Activity   Alcohol use: No    Alcohol/week: 0.0 standard drinks   Drug use: No   Sexual activity: Not Currently  Other Topics Concern   Not on file  Social History Narrative   Regular exercise--yes daily stretching.   Social Determinants of Health   Financial Resource Strain: Low Risk    Difficulty of Paying Living Expenses: Not hard at all  Food Insecurity: No Food Insecurity   Worried About Charity fundraiser in the Last Year: Never true   Arboriculturist in the Last Year: Never true  Transportation Needs: Not on file  Physical Activity: Sufficiently Active   Days of Exercise per Week: 5 days   Minutes of Exercise per Session: 30 min  Stress: No Stress Concern Present   Feeling of Stress : Not at all  Social Connections: Socially Isolated   Frequency of Communication with Friends and Family: More than three times a week   Frequency of Social Gatherings with Friends and Family: Once a week   Attends Religious Services: Never   Marine scientist or Organizations: No   Attends Archivist Meetings: Never   Marital Status: Widowed    Family History  Problem Relation Age of Onset   Arthritis Mother    Heart disease Mother    Hypertension Mother    Heart attack Mother    Hyperlipidemia Other    Heart disease Son    Hypertension Son    Cancer Father    Hyperlipidemia Sister    Cancer Brother    Colon cancer Neg Hx     Review of Systems  Constitutional:  Negative for chills and fever.  HENT:  Positive for hearing loss and tinnitus.   Respiratory:  Positive for cough (occ). Negative for shortness of breath and wheezing.   Cardiovascular:  Negative for chest pain, palpitations and leg swelling.  Gastrointestinal:  Negative for nausea.       No  gerd  Neurological:  Positive for light-headedness (a little at time, not positional). Negative for headaches.      Objective:   Vitals:   11/26/20 1316  BP: 132/78  Pulse: 68  Temp: 98.1 F (36.7 C)  SpO2: 95%   BP Readings from Last 3 Encounters:  11/26/20 132/78  05/23/20 128/70  02/08/20 118/60   Wt Readings from Last 3 Encounters:  11/26/20 171 lb (77.6 kg)  05/23/20 171 lb  6.4 oz (77.7 kg)  02/08/20 172 lb 3.2 oz (78.1 kg)   Body mass index is 27.6 kg/m.   Physical Exam    Constitutional: Appears well-developed and well-nourished. No distress.  HENT:  Head: Normocephalic and atraumatic.  Neck: Neck supple. No tracheal deviation present. No thyromegaly present.  No cervical lymphadenopathy Cardiovascular: Normal rate, regular rhythm and normal heart sounds.   No murmur heard. No carotid bruit .  No edema Pulmonary/Chest: Effort normal and breath sounds normal. No respiratory distress. No has no wheezes. No rales.  Skin: Skin is warm and dry. Not diaphoretic.  Psychiatric: Normal mood and affect. Behavior is normal.      Assessment & Plan:    See Problem List for Assessment and Plan of chronic medical problems.    This visit occurred during the SARS-CoV-2 public health emergency.  Safety protocols were in place, including screening questions prior to the visit, additional usage of staff PPE, and extensive cleaning of exam room while observing appropriate contact time as indicated for disinfecting solutions.

## 2020-11-26 NOTE — Assessment & Plan Note (Signed)
Chronic Referral to PT

## 2020-11-26 NOTE — Assessment & Plan Note (Signed)
Chronic Check lipid panel  Continue zetia 10 mg qd Regular exercise and healthy diet encouraged

## 2020-11-26 NOTE — Patient Instructions (Addendum)
  Blood work was ordered.     Medications changes include :   stop atenolol and start bystolic 2.5 mg daily for your BP  Your prescription(s) have been submitted to your pharmacy. Please take as directed and contact our office if you believe you are having problem(s) with the medication(s).   A referral was ordered for PT.      Someone from their office will call you to schedule an appointment.    Please followup in 6 months

## 2020-11-26 NOTE — Addendum Note (Signed)
Addended by: Jacobo Forest on: 11/26/2020 02:04 PM   Modules accepted: Orders

## 2020-11-26 NOTE — Assessment & Plan Note (Addendum)
Chronic BP well controlled here but variable at home - ? Related to cutting atenolol Continue amlodipine 5 mg bid, hydralazine 10 mg tid Stop atenolol and start bystolic 2.5 mg daily cmp

## 2020-11-26 NOTE — Assessment & Plan Note (Signed)
Chronic cmp 

## 2020-11-26 NOTE — Assessment & Plan Note (Signed)
Chronic Check a1c Low sugar / carb diet Stressed regular exercise  

## 2020-11-26 NOTE — Assessment & Plan Note (Signed)
Chronic Continue myrbetriq 25 mg qd

## 2020-11-27 LAB — CBC WITH DIFFERENTIAL/PLATELET
Basophils Absolute: 0 10*3/uL (ref 0.0–0.1)
Basophils Relative: 0.7 % (ref 0.0–3.0)
Eosinophils Absolute: 0.1 10*3/uL (ref 0.0–0.7)
Eosinophils Relative: 1.7 % (ref 0.0–5.0)
HCT: 40.2 % (ref 36.0–46.0)
Hemoglobin: 13.5 g/dL (ref 12.0–15.0)
Lymphocytes Relative: 26.4 % (ref 12.0–46.0)
Lymphs Abs: 1.8 10*3/uL (ref 0.7–4.0)
MCHC: 33.7 g/dL (ref 30.0–36.0)
MCV: 87 fl (ref 78.0–100.0)
Monocytes Absolute: 0.7 10*3/uL (ref 0.1–1.0)
Monocytes Relative: 10.2 % (ref 3.0–12.0)
Neutro Abs: 4.1 10*3/uL (ref 1.4–7.7)
Neutrophils Relative %: 61 % (ref 43.0–77.0)
Platelets: 208 10*3/uL (ref 150.0–400.0)
RBC: 4.62 Mil/uL (ref 3.87–5.11)
RDW: 15.6 % — ABNORMAL HIGH (ref 11.5–15.5)
WBC: 6.7 10*3/uL (ref 4.0–10.5)

## 2020-12-26 ENCOUNTER — Ambulatory Visit: Payer: BLUE CROSS/BLUE SHIELD | Admitting: Internal Medicine

## 2021-02-24 ENCOUNTER — Telehealth: Payer: Self-pay | Admitting: Pharmacist

## 2021-02-24 NOTE — Progress Notes (Signed)
Chronic Care Management Pharmacy Assistant   Name: Margaret Foley  MRN: [id number] DOB: 09-Oct-1934  Reason for Encounter: Disease State-Hypertension  Recent office visits:  11/26/20 Margaret Foley (PCP) - Hypertension. Start Nebivolol 2.5 mg. D/c Atenolol 12.5 mg. Referral to Physical Therapy.   Recent consult visits:  None noted  Hospital visits:  None in previous 6 months  Medications: Outpatient Encounter Medications as of 02/24/2021  Medication Sig   Acetaminophen (TYLENOL ARTHRITIS PAIN PO) Take by mouth.   amLODipine (NORVASC) 5 MG tablet TAKE 1 TABLET BY MOUTH TWICE DAILY.   calcium-vitamin D (OSCAL WITH D) 500-200 MG-UNIT per tablet Take 1 tablet by mouth daily.     ezetimibe (ZETIA) 10 MG tablet Take 1 tablet (10 mg total) by mouth daily.   hydrALAZINE (APRESOLINE) 10 MG tablet Take 1 tablet (10 mg total) by mouth 3 (three) times daily.   mirabegron ER (MYRBETRIQ) 25 MG TB24 tablet Take 1 tablet (25 mg total) by mouth daily.   Multiple Vitamins-Minerals (MULTIVITAMIN WITH MINERALS) tablet Take 1 tablet by mouth daily.   nebivolol (BYSTOLIC) 2.5 MG tablet Take 1 tablet (2.5 mg total) by mouth daily.   No facility-administered encounter medications on file as of 02/24/2021.   Reviewed chart prior to disease state call. Spoke with patient regarding BP  Recent Office Vitals: BP Readings from Last 3 Encounters:  11/26/20 132/78  05/23/20 128/70  02/08/20 118/60   Pulse Readings from Last 3 Encounters:  11/26/20 68  05/23/20 60  02/08/20 (!) 50    Wt Readings from Last 3 Encounters:  11/26/20 171 lb (77.6 kg)  05/23/20 171 lb 6.4 oz (77.7 kg)  02/08/20 172 lb 3.2 oz (78.1 kg)     Kidney Function Lab Results  Component Value Date/Time   CREATININE 1.25 (H) 11/26/2020 02:05 PM   CREATININE 1.52 (H) 05/23/2020 02:50 PM   CREATININE 0.89 04/02/2015 07:20 AM   CREATININE 0.93 10/04/2014 07:20 AM   GFR 39.63 (L) 11/26/2020 02:05 PM   GFRNONAA 25 (L) 05/08/2015 02:48  AM   GFRAA 29 (L) 05/08/2015 02:48 AM    BMP Latest Ref Rng & Units 11/26/2020 05/23/2020 03/27/2019  Glucose 70 - 99 mg/dL 94 89 109(H)  BUN 6 - 23 mg/dL 32(H) 37(H) 32(H)  Creatinine 0.40 - 1.20 mg/dL 1.25(H) 1.52(H) 1.28(H)  Sodium 135 - 145 mEq/L 142 140 141  Potassium 3.5 - 5.1 mEq/L 4.3 4.2 4.2  Chloride 96 - 112 mEq/L 107 104 108  CO2 19 - 32 mEq/L 28 28 26   Calcium 8.4 - 10.5 mg/dL 9.8 10.2 9.5    Current antihypertensive regimen:  Atenolol 25 mg - 1/2 tab daily - D/C 11/26/20 Amlodipine 5 mg twice a day Hydralazine 10 mg 3 times daily  How often are you checking your Blood Pressure?  1-2x per week  Current home BP readings:  Patient states her systolic runs around 761/607 and her diastolic runs between 70 and 80.  What recent interventions/DTPs have been made by any provider to improve Blood Pressure control since last CPP Visit:   None noted  Any recent hospitalizations or ED visits since last visit with CPP?  No  What diet changes have been made to improve Blood Pressure Control?  Patient states she has been eating much healthier she stop drinking coffee and eating ice cream which are two of her favorites.  What exercise is being done to improve your Blood Pressure Control?  Patient states she still cook, iron, mop and  do her own yard work to stay active. Patient states she was going to physical therapy but had to stop.  Adherence Review: Is the patient currently on ACE/ARB medication? No Does the patient have >5 day gap between last estimated fill dates? Yes Atenolol 25 mg - filled 10/09/20 90D (Discontinued) Amlodipine 5 mg - filled 09/04/20 90D Hydralazine 10 mg - filled 10/09/20 90D  Patient states she threw out some of her empty pill bottles and she don't know what all meds she should be taking. Patient states she would like to schedule a follow up appointment to go over current medications.  Star Rating Drugs: None Noted  Margaret Foley, [place] Clinical  Pharmacists Assistant [phone]

## 2021-03-10 NOTE — Progress Notes (Signed)
Called patient to schedule a follow up appointment. Patient has a Telephone appointment set for Oct. 27th @ 2 pm.  Orinda Kenner, [place] Clinical Pharmacists Assistant [phone]

## 2021-03-12 ENCOUNTER — Ambulatory Visit (INDEPENDENT_AMBULATORY_CARE_PROVIDER_SITE_OTHER): Payer: Medicare HMO

## 2021-03-12 NOTE — Progress Notes (Signed)
Chronic Care Management Pharmacy Note  03/12/2021 Name:  Margaret Foley MRN:  [id number] DOB:  10-03-1934  Summary: -Patient reports that since last PCP visit she has stopped taking all of her medications with the exception of nebivolol. -Patient denies having any issues or AE while taking the medications, stopped because she felt that she had been taking too many medications -Patient reports that since stopping her medications she has also stopped monitoring blood pressures   Recommendations/Changes made from today's visit: -Recommending for patient to restart ezetimibe and her calcium and vitamin D supplement.   -Advised for patient to restart monitoring BP, possible that nebivolol is all that is needed for control, patient will start monitoring daily - aware to reach out should BP >140/90  Subjective: Margaret Foley is an 86 y.o. year old female who is a primary patient of Burns, Claudina Lick, MD.  The CCM team was consulted for assistance with disease management and care coordination needs.    Engaged with patient by telephone for follow up visit in response to provider referral for pharmacy case management and/or care coordination services.   Consent to Services:  The patient was given the following information about Chronic Care Management services today, agreed to services, and gave verbal consent: 1. CCM service includes personalized support from designated clinical staff supervised by the primary care provider, including individualized plan of care and coordination with other care providers 2. 24/7 contact phone numbers for assistance for urgent and routine care needs. 3. Service will only be billed when office clinical staff spend 20 minutes or more in a month to coordinate care. 4. Only one practitioner may furnish and bill the service in a calendar month. 5.The patient may stop CCM services at any time (effective at the end of the month) by phone call to the office staff. 6. The  patient will be responsible for cost sharing (co-pay) of up to 20% of the service fee (after annual deductible is met). Patient agreed to services and consent obtained.  Patient Care Team: Binnie Rail, MD as PCP - General (Internal Medicine) Garald Balding, MD (Orthopedic Surgery) Josue Hector, MD (Cardiology) Charlton Haws, [facility] as Pharmacist (Pharmacist)  Recent office visits: 11/26/2020 - Dr. [name] - f/u - patient to stop atenolol and start bystolic 2.4MP daily - referred to PT   Recent consult visits: none  Hospital visits: None in previous 6 months  Objective:  Lab Results  Component Value Date   CREATININE 1.25 (H) 11/26/2020   BUN 32 (H) 11/26/2020   GFR 39.63 (L) 11/26/2020   GFRNONAA 25 (L) 05/08/2015   GFRAA 29 (L) 05/08/2015   NA 142 11/26/2020   K 4.3 11/26/2020   CALCIUM 9.8 11/26/2020   CO2 28 11/26/2020   GLUCOSE 94 11/26/2020    Lab Results  Component Value Date/Time   HGBA1C 6.0 11/26/2020 02:05 PM   HGBA1C 5.7 05/23/2020 02:50 PM   GFR 39.63 (L) 11/26/2020 02:05 PM   GFR 31.45 (L) 05/23/2020 02:50 PM    Last diabetic Eye exam:  No results found for: HMDIABEYEEXA  Last diabetic Foot exam:  No results found for: HMDIABFOOTEX   Lab Results  Component Value Date   CHOL 205 (H) 05/23/2020   HDL 55.10 05/23/2020   LDLCALC 121 (H) 05/23/2020   LDLDIRECT 176.0 03/24/2017   TRIG 144.0 05/23/2020   CHOLHDL 4 05/23/2020    Hepatic Function Latest Ref Rng & Units 11/26/2020 05/23/2020 03/27/2019  Total Protein  6.0 - 8.3 g/dL 7.3 7.5 7.3  Albumin 3.5 - 5.2 g/dL 4.2 4.3 4.3  AST 0 - 37 U/L _0 ALT 0 - 35 U/L _1 Alk Phosphatase 39 - 117 U/L 76 72 79  Total Bilirubin 0.2 - 1.2 mg/dL 0.5 0.5 0.3  Bilirubin, Direct 0.0 - 0.3 mg/dL - - -    Lab Results  Component Value Date/Time   TSH 1.50 05/23/2020 02:50 PM   TSH 2.94 03/27/2019 02:49 PM    CBC Latest Ref Rng & Units 11/26/2020 05/23/2020 03/27/2019  WBC 4.0 - 10.5 K/uL  6.7 6.9 6.9  Hemoglobin 12.0 - 15.0 g/dL 13.5 13.4 13.2  Hematocrit 36.0 - 46.0 % 40.2 40.4 39.5  Platelets 150.0 - 400.0 K/uL 208.0 201.0 212.0    Lab Results  Component Value Date/Time   VD25OH 29.17 (L) 05/23/2020 02:50 PM   VD25OH 26.94 (L) 03/27/2019 02:49 PM    Clinical ASCVD: No  The ASCVD Risk score (Arnett DK, et al., 2019) failed to calculate for the following reasons:   The 2019 ASCVD risk score is only valid for ages 72 to 91    Depression screen PHQ 2/9 02/08/2020 02/02/2019 12/14/2017  Decreased Interest 0 0 0  Down, Depressed, Hopeless 0 1 1  PHQ - 2 Score 0 1 1  Altered sleeping - 0 1  Tired, decreased energy - 1 1  Change in appetite - 0 0  Feeling bad or failure about yourself  - 0 0  Trouble concentrating - 0 0  Moving slowly or fidgety/restless - 0 0  Suicidal thoughts - 0 0  PHQ-9 Score - 2 3  Difficult doing work/chores - Not difficult at all Not difficult at all     Social History   Tobacco Use  Smoking Status Some Days   Packs/day: 0.25   Years: 57.00   Pack years: 14.25   Types: Cigarettes  Smokeless Tobacco Never   BP Readings from Last 3 Encounters:  11/26/20 132/78  05/23/20 128/70  02/08/20 118/60   Pulse Readings from Last 3 Encounters:  11/26/20 68  05/23/20 60  02/08/20 (!) 50   Wt Readings from Last 3 Encounters:  11/26/20 171 lb (77.6 kg)  05/23/20 171 lb 6.4 oz (77.7 kg)  02/08/20 172 lb 3.2 oz (78.1 kg)   BMI Readings from Last 3 Encounters:  11/26/20 27.60 kg/m  05/23/20 27.66 kg/m  02/08/20 27.79 kg/m    Assessment/Interventions: Review of patient past medical history, allergies, medications, health status, including review of consultants reports, laboratory and other test data, was performed as part of comprehensive evaluation and provision of chronic care management services.   SDOH:  (Social Determinants of Health) assessments and interventions performed: Yes  SDOH Screenings   Alcohol Screen: Not on file   Depression (PHQ2-9): Not on file  Financial Resource Strain: Not on file  Food Insecurity: Not on file  Housing: Not on file  Physical Activity: Not on file  Social Connections: Not on file  Stress: Not on file  Tobacco Use: High Risk   Smoking Tobacco Use: Some Days   Smokeless Tobacco Use: Never   Passive Exposure: Not on file  Transportation Needs: Not on file    CCM Care Plan  Allergies  Allergen Reactions   Penicillins Rash    Has patient had a PCN reaction causing immediate rash, facial/tongue/throat swelling, SOB or lightheadedness with hypotension: Yes Has patient had a PCN reaction causing severe rash involving  mucus membranes or skin necrosis: No Has patient had a PCN reaction that required hospitalization No Has patient had a PCN reaction occurring within the last 10 years: No If all of the above answers are "NO", then may proceed with Cephalosporin use.    Aleve [Naproxen] Other (See Comments)    Caused scar tissue    Benazepril Hcl     REACTION: cough   Carvedilol     REACTION: ear buzzing ??   Indomethacin    Pravastatin Sodium     REACTION: achy   Rosuvastatin     REACTION: buzzing   Simvastatin     REACTION: myalgia   Tramadol     Ears ringing?   Advil [Ibuprofen] Rash    Medications Reviewed Today     Reviewed by Tomasa Blase, [facility] (Pharmacist) on 03/12/21 at 1410  Med List Status: <None>   Medication Order Taking? Sig Documenting Provider Last Dose Status Informant  Acetaminophen (TYLENOL ARTHRITIS PAIN PO) [id number]  Take by mouth. [provider]  Active   amLODipine (NORVASC) 5 MG tablet [id number] No TAKE 1 TABLET BY MOUTH TWICE DAILY.  Patient not taking: Reported on 03/12/2021   Binnie Rail, MD Not Taking Active   calcium-vitamin D (OSCAL WITH D) 500-200 MG-UNIT per tablet [id number] No Take 1 tablet by mouth daily.    Patient not taking: Reported on 03/12/2021   [provider] Not Taking Active Self  ezetimibe  (ZETIA) 10 MG tablet [id number] No Take 1 tablet (10 mg total) by mouth daily.  Patient not taking: Reported on 03/12/2021   Binnie Rail, MD Not Taking Active   hydrALAZINE (APRESOLINE) 10 MG tablet [id number] No Take 1 tablet (10 mg total) by mouth 3 (three) times daily.  Patient not taking: Reported on 03/12/2021   Binnie Rail, MD Not Taking Active   mirabegron ER (MYRBETRIQ) 25 MG TB24 tablet [id number] No Take 1 tablet (25 mg total) by mouth daily.  Patient not taking: Reported on 03/12/2021   Binnie Rail, MD Not Taking Active   Multiple Vitamins-Minerals (MULTIVITAMIN WITH MINERALS) tablet [id number]  Take 1 tablet by mouth daily. [provider]  Active Self  nebivolol (BYSTOLIC) 2.5 MG tablet [id number] Yes Take 1 tablet (2.5 mg total) by mouth daily. Binnie Rail, MD Taking Active             Patient Active Problem List   Diagnosis Date Noted   Physical deconditioning 11/26/2020   Drug-induced myopathy 05/23/2020   Poor balance 05/23/2020   Overactive bladder 03/27/2019   Urinary incontinence 03/27/2019   Microscopic colitis 03/24/2018   Syncope 09/21/2017   Prediabetes 03/24/2017   CKD (chronic kidney disease) 02/23/2016   Dizziness 11/05/2015   LBBB (left bundle branch block) 04/16/2015   AAA (abdominal aortic aneurysm) without rupture ([place]) 04/08/2014   PAROXYSMAL ATRIAL FIBRILLATION 02/04/2009   CAROTID ARTERY DISEASE 02/04/2009   HIP PAIN 10/28/2008   Essential hypertension 12/13/2007   PERIPHERAL VASCULAR DISEASE 04/10/2007   B12 deficiency 01/05/2007   Vitamin D deficiency 01/05/2007   Hyperlipidemia 01/05/2007   Tobacco dependence due to cigarettes 01/05/2007   CORONARY ARTERY DISEASE 01/05/2007   OCCLUSION, VERTEBRAL ARTERY W/O INFARCTION 01/05/2007   Barrett's esophagus 01/05/2007   Osteoarthritis 01/05/2007   LOW BACK PAIN 01/05/2007   Osteoporosis 01/05/2007    Immunization History  Administered Date(s) Administered   Fluad  Quad(high Dose 65+) 03/27/2019   Influenza Whole 04/05/2006, 04/10/2007, 05/19/2009, 03/06/2010   Influenza,  High Dose Seasonal PF 06/18/2016, 03/24/2017, 03/24/2018, 02/16/2020   PFIZER(Purple Top)SARS-COV-2 Vaccination 07/10/2019, 07/31/2019   Pneumococcal Conjugate-13 12/14/2017   Pneumococcal Polysaccharide-23 02/03/2009    Conditions to be addressed/monitored:  Hypertension and Hyperlipidemia  Care Plan : [place]  Updates made by Tomasa Blase, RPH since 03/12/2021 12:00 AM     Problem: Hypertension and Hyperlipidemia   Priority: High  Onset Date: 03/12/2021     Long-Range Goal: Disease Managemenent   Start Date: 03/12/2021  Expected End Date: 03/12/2022  This Visit's Progress: On track  Priority: High  Note:   Current Barriers:  Unable to independently monitor therapeutic efficacy  Pharmacist Clinical Goal(s):  Patient will achieve adherence to monitoring guidelines and medication adherence to achieve therapeutic efficacy adhere to prescribed medication regimen as evidenced by taking medications as prescribed through collaboration with PharmD and provider.   Interventions: 1:1 collaboration with Binnie Rail, MD regarding development and update of comprehensive plan of care as evidenced by provider attestation and co-signature Inter-disciplinary care team collaboration (see longitudinal plan of care) Comprehensive medication review performed; medication list updated in electronic medical record  Hypertension (BP goal <140/90) -Unknown  -Current treatment: Nebivolol 2.78m - 1 tablet daily  Amlodipine 574m- 1 tablet twice daily - not currently taking  Hydralazine 1021m 1 tablet 3 times daily  - not currently taking  -Medications previously tried: atenolol, losartan, benazepril, carvedilol  -Current home readings: n/a -Current dietary habits: reports that she has stopped drinking caffeinated beverages, has been eating a healthier diet -Current  exercise habits: reports to keeping up with her housework and yard work with the exception of mowing her lawn  -Denies hypotensive/hypertensive symptoms -Educated on BP goals and benefits of medications for prevention of heart attack, stroke and kidney damage; Daily salt intake goal < 2300 mg; Exercise goal of 150 minutes per week; Importance of home blood pressure monitoring; Proper BP monitoring technique; -Counseled to monitor BP at home daily, document, and provide log at future appointments -Counseled on diet and exercise extensively Recommended to continue current medication  Hyperlipidemia: (LDL goal < 100) -Not ideally controlled Lab Results  Component Value Date   LDLCALC 121 (H) 05/23/2020  -Current treatment: Ezetimibe 44m2m1 tablet daily - not currently taking  -Medications previously tried: simvastatin, rosuvastatin, pravastatin   -Current dietary patterns: reports that she has changed her diet and is eating healthier, more fresh foods -Current exercise habits: reports to keeping up with her housework and yard work with the exception of mowing her lawn  -Educated on Cholesterol goals;  Importance of limiting foods high in cholesterol; -Counseled on diet and exercise extensively Recommended to restart ezetimibe 44mg66mly   Chronic Kidney Disease (Goal: Prevention of disease progression) -Controlled -Last eGFR - 39.63 mL/min -Current treatment  N/a - avoidance of nephrotoxic agents / adequate BP control to prevent disease progression  -Counseled on avoidance of NSAIDs - use of APAP as needed for acute pains/ fever  Health Maintenance -Vaccine gaps: tdap, shingles, COVID booster, and influenza vaccine  -Current therapy:  Myrbetriq 25mg 32mtablet daily - not currently taking  Multivitamin - 1 tablet daily  - not currently taking  Calcium+VitD 500mg-263mits- 1 tablet daily - -Recommended for patient to restart calcium and vitamin D supplement  Patient  Goals/Self-Care Activities Patient will:  - take medications as prescribed check blood pressure daily, document, and provide at future appointments  Follow Up Plan: Telephone follow up appointment with care management team  member scheduled for: 4 weeks  The patient has been provided with contact information for the care management team and has been advised to call with any health related questions or concerns.       Medication Assistance: None required.  Patient affirms current coverage meets needs.  Patient's preferred pharmacy is:  [place], [place] [address] Phone: [phone] Fax: [phone]   Uses pill box? No - does not feel it is necessary  Pt endorses 10-20% compliance  Care Plan and Follow Up Patient Decision:  Patient agrees to Care Plan and Follow-up.  Plan: Telephone follow up appointment with care management team member scheduled for:  4 weeks  The patient has been provided with contact information for the care management team and has been advised to call with any health related questions or concerns.   Tomasa Blase, PharmD Clinical Pharmacist, [place]

## 2021-03-12 NOTE — Patient Instructions (Signed)
[name],  It was great to talk to you today!  Please call me with any questions or concerns.  Visit Information  PATIENT GOALS:  Goals Addressed             This Visit's Progress    Track and Manage My Blood Pressure-Hypertension       Timeframe:  Long-Range Goal Priority:  High Start Date:  03/12/21                           Expected End Date: 03/12/22                      Follow Up Date 04/12/21   - check blood pressure daily - choose a place to take my blood pressure (home, clinic or office, retail store) - write blood pressure results in a log or diary    Why is this important?   You won't feel high blood pressure, but it can still hurt your blood vessels.  High blood pressure can cause heart or kidney problems. It can also cause a stroke.  Making lifestyle changes like losing a little weight or eating less salt will help.  Checking your blood pressure at home and at different times of the day can help to control blood pressure.  If the doctor prescribes medicine remember to take it the way the doctor ordered.  Call the office if you cannot afford the medicine or if there are questions about it.          The patient verbalized understanding of instructions, educational materials, and care plan provided today and declined offer to receive copy of patient instructions, educational materials, and care plan.   Telephone follow up appointment with care management team member scheduled for: The patient has been provided with contact information for the care management team and has been advised to call with any health related questions or concerns.   Tomasa Blase, PharmD Clinical Pharmacist, [place]

## 2021-03-16 DIAGNOSIS — I10 Essential (primary) hypertension: Secondary | ICD-10-CM | POA: Diagnosis not present

## 2021-03-16 DIAGNOSIS — E782 Mixed hyperlipidemia: Secondary | ICD-10-CM | POA: Diagnosis not present

## 2021-04-13 ENCOUNTER — Telehealth: Payer: BLUE CROSS/BLUE SHIELD

## 2021-04-13 NOTE — Progress Notes (Deleted)
Chronic Care Management Pharmacy Note  04/13/2021 Name:  Margaret Foley MRN:  [id number] DOB:  [birth year]  Summary: -Patient reports that since last PCP visit she has stopped taking all of her medications with the exception of nebivolol. -Patient denies having any issues or AE while taking the medications, stopped because she felt that she had been taking too many medications -Patient reports that since stopping her medications she has also stopped monitoring blood pressures   Recommendations/Changes made from today's visit: -Recommending for patient to restart ezetimibe and her calcium and vitamin D supplement.   -Advised for patient to restart monitoring BP, possible that nebivolol is all that is needed for control, patient will start monitoring daily - aware to reach out should BP >140/90  Subjective: Margaret Foley is an 86 y.o. year old female who is a primary patient of Burns, Claudina Lick, MD.  The CCM team was consulted for assistance with disease management and care coordination needs.    Engaged with patient by telephone for follow up visit in response to provider referral for pharmacy case management and/or care coordination services.   Consent to Services:  The patient was given the following information about Chronic Care Management services today, agreed to services, and gave verbal consent: 1. CCM service includes personalized support from designated clinical staff supervised by the primary care provider, including individualized plan of care and coordination with other care providers 2. 24/7 contact phone numbers for assistance for urgent and routine care needs. 3. Service will only be billed when office clinical staff spend 20 minutes or more in a month to coordinate care. 4. Only one practitioner may furnish and bill the service in a calendar month. 5.The patient may stop CCM services at any time (effective at the end of the month) by phone call to the office staff. 6. The  patient will be responsible for cost sharing (co-pay) of up to 20% of the service fee (after annual deductible is met). Patient agreed to services and consent obtained.  Patient Care Team: Binnie Rail, MD as PCP - General (Internal Medicine) Garald Balding, MD (Orthopedic Surgery) Josue Hector, MD (Cardiology) Charlton Haws, [facility] as Pharmacist (Pharmacist)  Recent office visits: None since last visit   Recent consult visits: None since last visit   Hospital visits: None in previous 6 months  Objective:  Lab Results  Component Value Date   CREATININE 1.25 (H) 11/26/2020   BUN 32 (H) 11/26/2020   GFR 39.63 (L) 11/26/2020   GFRNONAA 25 (L) 05/08/2015   GFRAA 29 (L) 05/08/2015   NA 142 11/26/2020   K 4.3 11/26/2020   CALCIUM 9.8 11/26/2020   CO2 28 11/26/2020   GLUCOSE 94 11/26/2020    Lab Results  Component Value Date/Time   HGBA1C 6.0 11/26/2020 02:05 PM   HGBA1C 5.7 05/23/2020 02:50 PM   GFR 39.63 (L) 11/26/2020 02:05 PM   GFR 31.45 (L) 05/23/2020 02:50 PM    Last diabetic Eye exam:  No results found for: HMDIABEYEEXA  Last diabetic Foot exam:  No results found for: HMDIABFOOTEX   Lab Results  Component Value Date   CHOL 205 (H) 05/23/2020   HDL 55.10 05/23/2020   LDLCALC 121 (H) 05/23/2020   LDLDIRECT 176.0 03/24/2017   TRIG 144.0 05/23/2020   CHOLHDL 4 05/23/2020    Hepatic Function Latest Ref Rng & Units 11/26/2020 05/23/2020 03/27/2019  Total Protein 6.0 - 8.3 g/dL 7.3 7.5 7.3  Albumin 3.5 - 5.2  g/dL 4.2 4.3 4.3  AST 0 - 37 U/L _0 ALT 0 - 35 U/L _1 Alk Phosphatase 39 - 117 U/L 76 72 79  Total Bilirubin 0.2 - 1.2 mg/dL 0.5 0.5 0.3  Bilirubin, Direct 0.0 - 0.3 mg/dL - - -    Lab Results  Component Value Date/Time   TSH 1.50 05/23/2020 02:50 PM   TSH 2.94 03/27/2019 02:49 PM    CBC Latest Ref Rng & Units 11/26/2020 05/23/2020 03/27/2019  WBC 4.0 - 10.5 K/uL 6.7 6.9 6.9  Hemoglobin 12.0 - 15.0 g/dL 13.5 13.4 13.2   Hematocrit 36.0 - 46.0 % 40.2 40.4 39.5  Platelets 150.0 - 400.0 K/uL 208.0 201.0 212.0    Lab Results  Component Value Date/Time   VD25OH 29.17 (L) 05/23/2020 02:50 PM   VD25OH 26.94 (L) 03/27/2019 02:49 PM    Clinical ASCVD: No  The ASCVD Risk score (Arnett DK, et al., 2019) failed to calculate for the following reasons:   The 2019 ASCVD risk score is only valid for ages 84 to 7    Depression screen PHQ 2/9 02/08/2020 02/02/2019 12/14/2017  Decreased Interest 0 0 0  Down, Depressed, Hopeless 0 1 1  PHQ - 2 Score 0 1 1  Altered sleeping - 0 1  Tired, decreased energy - 1 1  Change in appetite - 0 0  Feeling bad or failure about yourself  - 0 0  Trouble concentrating - 0 0  Moving slowly or fidgety/restless - 0 0  Suicidal thoughts - 0 0  PHQ-9 Score - 2 3  Difficult doing work/chores - Not difficult at all Not difficult at all     Social History   Tobacco Use  Smoking Status Some Days   Packs/day: 0.25   Years: 57.00   Pack years: 14.25   Types: Cigarettes  Smokeless Tobacco Never   BP Readings from Last 3 Encounters:  11/26/20 132/78  05/23/20 128/70  02/08/20 118/60   Pulse Readings from Last 3 Encounters:  11/26/20 68  05/23/20 60  02/08/20 (!) 50   Wt Readings from Last 3 Encounters:  11/26/20 171 lb (77.6 kg)  05/23/20 171 lb 6.4 oz (77.7 kg)  02/08/20 172 lb 3.2 oz (78.1 kg)   BMI Readings from Last 3 Encounters:  11/26/20 27.60 kg/m  05/23/20 27.66 kg/m  02/08/20 27.79 kg/m    Assessment/Interventions: Review of patient past medical history, allergies, medications, health status, including review of consultants reports, laboratory and other test data, was performed as part of comprehensive evaluation and provision of chronic care management services.   SDOH:  (Social Determinants of Health) assessments and interventions performed: Yes  SDOH Screenings   Alcohol Screen: Not on file  Depression (PHQ2-9): Not on file  Financial Resource  Strain: Not on file  Food Insecurity: Not on file  Housing: Not on file  Physical Activity: Not on file  Social Connections: Not on file  Stress: Not on file  Tobacco Use: High Risk   Smoking Tobacco Use: Some Days   Smokeless Tobacco Use: Never   Passive Exposure: Not on file  Transportation Needs: Not on file    CCM Care Plan  Allergies  Allergen Reactions   Penicillins Rash    Has patient had a PCN reaction causing immediate rash, facial/tongue/throat swelling, SOB or lightheadedness with hypotension: Yes Has patient had a PCN reaction causing severe rash involving mucus membranes or skin necrosis: No Has patient had a PCN reaction  that required hospitalization No Has patient had a PCN reaction occurring within the last 10 years: No If all of the above answers are "NO", then may proceed with Cephalosporin use.    Aleve [Naproxen] Other (See Comments)    Caused scar tissue    Benazepril Hcl     REACTION: cough   Carvedilol     REACTION: ear buzzing ??   Indomethacin    Pravastatin Sodium     REACTION: achy   Rosuvastatin     REACTION: buzzing   Simvastatin     REACTION: myalgia   Tramadol     Ears ringing?   Advil [Ibuprofen] Rash    Medications Reviewed Today     Reviewed by Tomasa Blase, [facility] (Pharmacist) on 03/12/21 at 1410  Med List Status: <None>   Medication Order Taking? Sig Documenting Provider Last Dose Status Informant  Acetaminophen (TYLENOL ARTHRITIS PAIN PO) [id number]  Take by mouth. [provider]  Active   amLODipine (NORVASC) 5 MG tablet [id number] No TAKE 1 TABLET BY MOUTH TWICE DAILY.  Patient not taking: Reported on 03/12/2021   Binnie Rail, MD Not Taking Active   calcium-vitamin D (OSCAL WITH D) 500-200 MG-UNIT per tablet [id number] No Take 1 tablet by mouth daily.    Patient not taking: Reported on 03/12/2021   [provider] Not Taking Active Self  ezetimibe (ZETIA) 10 MG tablet [id number] No Take 1 tablet (10 mg  total) by mouth daily.  Patient not taking: Reported on 03/12/2021   Binnie Rail, MD Not Taking Active   hydrALAZINE (APRESOLINE) 10 MG tablet [id number] No Take 1 tablet (10 mg total) by mouth 3 (three) times daily.  Patient not taking: Reported on 03/12/2021   Binnie Rail, MD Not Taking Active   mirabegron ER (MYRBETRIQ) 25 MG TB24 tablet [id number] No Take 1 tablet (25 mg total) by mouth daily.  Patient not taking: Reported on 03/12/2021   Binnie Rail, MD Not Taking Active   Multiple Vitamins-Minerals (MULTIVITAMIN WITH MINERALS) tablet [id number]  Take 1 tablet by mouth daily. [provider]  Active Self  nebivolol (BYSTOLIC) 2.5 MG tablet [id number] Yes Take 1 tablet (2.5 mg total) by mouth daily. Binnie Rail, MD Taking Active             Patient Active Problem List   Diagnosis Date Noted   Physical deconditioning 11/26/2020   Drug-induced myopathy 05/23/2020   Poor balance 05/23/2020   Overactive bladder 03/27/2019   Urinary incontinence 03/27/2019   Microscopic colitis 03/24/2018   Syncope 09/21/2017   Prediabetes 03/24/2017   CKD (chronic kidney disease) 02/23/2016   Dizziness 11/05/2015   LBBB (left bundle branch block) 04/16/2015   AAA (abdominal aortic aneurysm) without rupture ([place]) 04/08/2014   PAROXYSMAL ATRIAL FIBRILLATION 02/04/2009   CAROTID ARTERY DISEASE 02/04/2009   HIP PAIN 10/28/2008   Essential hypertension 12/13/2007   PERIPHERAL VASCULAR DISEASE 04/10/2007   B12 deficiency 01/05/2007   Vitamin D deficiency 01/05/2007   Hyperlipidemia 01/05/2007   Tobacco dependence due to cigarettes 01/05/2007   CORONARY ARTERY DISEASE 01/05/2007   OCCLUSION, VERTEBRAL ARTERY W/O INFARCTION 01/05/2007   Barrett's esophagus 01/05/2007   Osteoarthritis 01/05/2007   LOW BACK PAIN 01/05/2007   Osteoporosis 01/05/2007    Immunization History  Administered Date(s) Administered   Fluad Quad(high Dose 65+) 03/27/2019   Influenza Whole 04/05/2006,  04/10/2007, 05/19/2009, 03/06/2010   Influenza, High Dose Seasonal PF 06/18/2016, 03/24/2017, 03/24/2018, 02/16/2020   PFIZER(Purple Top)SARS-COV-2  Vaccination 07/10/2019, 07/31/2019   Pneumococcal Conjugate-13 12/14/2017   Pneumococcal Polysaccharide-23 02/03/2009    Conditions to be addressed/monitored:  Hypertension and Hyperlipidemia  There are no care plans that you recently modified to display for this patient.     Medication Assistance: None required.  Patient affirms current coverage meets needs.  Patient's preferred pharmacy is:  [facility], [place] [address] Phone: [phone] Fax: [phone]   Uses pill box? No - does not feel it is necessary  Pt endorses 10-20% compliance  Care Plan and Follow Up Patient Decision:  Patient agrees to Care Plan and Follow-up.  Plan: Telephone follow up appointment with care management team member scheduled for:  ***  The patient has been provided with contact information for the care management team and has been advised to call with any health related questions or concerns.   Tomasa Blase, PharmD Clinical Pharmacist, [place]   Current chart prep = 4 minutes

## 2021-06-04 ENCOUNTER — Telehealth: Payer: Self-pay

## 2021-06-04 NOTE — Progress Notes (Signed)
° ° °  Chronic Care Management Pharmacy Assistant   Name: Margaret Foley  MRN: [id number] DOB: 1934-07-17   Reason for Encounter: Disease State   Conditions to be addressed/monitored: HTN   Recent office visits:  None ID  Recent consult visits:  None ID  Hospital visits:  None in previous 6 months  Medications: Outpatient Encounter Medications as of 06/04/2021  Medication Sig   Acetaminophen (TYLENOL ARTHRITIS PAIN PO) Take by mouth.   amLODipine (NORVASC) 5 MG tablet TAKE 1 TABLET BY MOUTH TWICE DAILY. (Patient not taking: Reported on 03/12/2021)   calcium-vitamin D (OSCAL WITH D) 500-200 MG-UNIT per tablet Take 1 tablet by mouth daily.   (Patient not taking: Reported on 03/12/2021)   ezetimibe (ZETIA) 10 MG tablet Take 1 tablet (10 mg total) by mouth daily. (Patient not taking: Reported on 03/12/2021)   hydrALAZINE (APRESOLINE) 10 MG tablet Take 1 tablet (10 mg total) by mouth 3 (three) times daily. (Patient not taking: Reported on 03/12/2021)   mirabegron ER (MYRBETRIQ) 25 MG TB24 tablet Take 1 tablet (25 mg total) by mouth daily. (Patient not taking: Reported on 03/12/2021)   Multiple Vitamins-Minerals (MULTIVITAMIN WITH MINERALS) tablet Take 1 tablet by mouth daily.   nebivolol (BYSTOLIC) 2.5 MG tablet Take 1 tablet (2.5 mg total) by mouth daily.   No facility-administered encounter medications on file as of 06/04/2021.    Recent Office Vitals: BP Readings from Last 3 Encounters:  11/26/20 132/78  05/23/20 128/70  02/08/20 118/60   Pulse Readings from Last 3 Encounters:  11/26/20 68  05/23/20 60  02/08/20 (!) 50    Wt Readings from Last 3 Encounters:  11/26/20 171 lb (77.6 kg)  05/23/20 171 lb 6.4 oz (77.7 kg)  02/08/20 172 lb 3.2 oz (78.1 kg)     Kidney Function Lab Results  Component Value Date/Time   CREATININE 1.25 (H) 11/26/2020 02:05 PM   CREATININE 1.52 (H) 05/23/2020 02:50 PM   CREATININE 0.89 04/02/2015 07:20 AM   CREATININE 0.93 10/04/2014 07:20  AM   GFR 39.63 (L) 11/26/2020 02:05 PM   GFRNONAA 25 (L) 05/08/2015 02:48 AM   GFRAA 29 (L) 05/08/2015 02:48 AM    BMP Latest Ref Rng & Units 11/26/2020 05/23/2020 03/27/2019  Glucose 70 - 99 mg/dL 94 89 109(H)  BUN 6 - 23 mg/dL 32(H) 37(H) 32(H)  Creatinine 0.40 - 1.20 mg/dL 1.25(H) 1.52(H) 1.28(H)  Sodium 135 - 145 mEq/L 142 140 141  Potassium 3.5 - 5.1 mEq/L 4.3 4.2 4.2  Chloride 96 - 112 mEq/L 107 104 108  CO2 19 - 32 mEq/L 28 28 26   Calcium 8.4 - 10.5 mg/dL 9.8 10.2 9.5   Reviewed chart prior to disease state call. Unable to speak with patient regarding BP. Made 3 attempts and could not leave message for stayed busy  Current antihypertensive regimen:  Nebivolol 2.5 mg Amlodipine 5 mg Hydralazine 10 mg   What recent interventions/DTPs have been made by any provider to improve Blood Pressure control since last CPP Visit: none noted  Any recent hospitalizations or ED visits since last visit with CPP? No   Adherence Review: Is the patient currently on ACE/ARB medication? No Does the patient have >5 day gap between last estimated fill dates? No   Care Gaps: Colonoscopy-NA Diabetic Foot Exam-NA Mammogram-NA Ophthalmology-NA Dexa Scan - 12/18/19 Annual Well Visit - NA Micro albumin-NA Hemoglobin A1c- 11/26/20  Star Rating Drugs: None ID  [place] Pharmacist Assistant [phone]

## 2021-06-26 ENCOUNTER — Ambulatory Visit (INDEPENDENT_AMBULATORY_CARE_PROVIDER_SITE_OTHER): Payer: Medicare HMO

## 2021-06-26 ENCOUNTER — Ambulatory Visit (HOSPITAL_COMMUNITY)
Admission: EM | Admit: 2021-06-26 | Discharge: 2021-06-26 | Disposition: A | Discharge status: Home / Self Care | Payer: Medicare HMO | Attending: Physician Assistant | Admitting: Physician Assistant

## 2021-06-26 ENCOUNTER — Encounter (HOSPITAL_COMMUNITY): Payer: Self-pay

## 2021-06-26 ENCOUNTER — Other Ambulatory Visit: Payer: Self-pay

## 2021-06-26 DIAGNOSIS — M25552 Pain in left hip: Secondary | ICD-10-CM | POA: Diagnosis not present

## 2021-06-26 DIAGNOSIS — M545 Low back pain, unspecified: Secondary | ICD-10-CM

## 2021-06-26 DIAGNOSIS — S32010A Wedge compression fracture of first lumbar vertebra, initial encounter for closed fracture: Secondary | ICD-10-CM

## 2021-06-26 DIAGNOSIS — M47816 Spondylosis without myelopathy or radiculopathy, lumbar region: Secondary | ICD-10-CM | POA: Diagnosis not present

## 2021-06-26 DIAGNOSIS — W19XXXA Unspecified fall, initial encounter: Secondary | ICD-10-CM | POA: Diagnosis not present

## 2021-06-26 DIAGNOSIS — M1612 Unilateral primary osteoarthritis, left hip: Secondary | ICD-10-CM | POA: Diagnosis not present

## 2021-06-26 NOTE — ED Provider Notes (Signed)
[place]    CSN: [id number] Arrival date & time: 06/26/21  1825      History   Chief Complaint Chief Complaint  Patient presents with   Fall   Back Pain    HPI Margaret Foley is a 87 y.o. female.   Patient presents today with a 2-day history of severe left lower back/hip pain.  Reports that she fell while getting out of the shower 2 days ago and has had ongoing pain since that time.  She is confident that she did not hit her head and denies any loss of consciousness, headache, dizziness, amnesia surrounding event, nausea, vomiting.  She does not take any blood thinning medication.  She reports that pain is minimal when she is resting but becomes a 10 when she tries to change position from sitting to standing or lying to standing.  Pain is localized to left lumbar back with radiation into hip, described as sharp, no alleviating factors identified.  She denies previous spinal injury or hip injury.  She does have a history of osteoporosis and is not currently on any bisphosphonates or other medications.  She lives alone.  She has been taking Tylenol with minimal improvement of symptoms.   Past Medical History:  Diagnosis Date   Anxiety    Atrial fibrillation ([place])    Barrett's esophagus    CAD (coronary artery disease)    Cancer (HCC)    Pre cancerous Uterine Tumor   Carotid artery occlusion    Chest pain    atypical normal myovue 2008   COPD (chronic obstructive pulmonary disease) (HCC)    Cough    Depression    Fibromyalgia    GERD (gastroesophageal reflux disease)    Heart murmur    av sclerosis and mild ar echo 2008   Hip pain    History of hiatal hernia    Hyperlipidemia    Hypertension    Insomnia, unspecified    Kidney stone    Lumbago    Myalgia and myositis, unspecified    statin intolerant   Occlusion and stenosis of vertebral artery without mention of cerebral infarction    Osteoarthritis    Osteopenia    Other B-complex deficiencies     Peripheral artery disease ([place]) 12/02/2016   Peripheral vascular disease (HCC)    Shortness of breath dyspnea    Tobacco use disorder    Unspecified vitamin D deficiency     Patient Active Problem List   Diagnosis Date Noted   Physical deconditioning 11/26/2020   Drug-induced myopathy 05/23/2020   Poor balance 05/23/2020   Overactive bladder 03/27/2019   Urinary incontinence 03/27/2019   Microscopic colitis 03/24/2018   Syncope 09/21/2017   Prediabetes 03/24/2017   CKD (chronic kidney disease) 02/23/2016   Dizziness 11/05/2015   LBBB (left bundle branch block) 04/16/2015   AAA (abdominal aortic aneurysm) without rupture ([place]) 04/08/2014   PAROXYSMAL ATRIAL FIBRILLATION 02/04/2009   CAROTID ARTERY DISEASE 02/04/2009   HIP PAIN 10/28/2008   Essential hypertension 12/13/2007   PERIPHERAL VASCULAR DISEASE 04/10/2007   B12 deficiency 01/05/2007   Vitamin D deficiency 01/05/2007   Hyperlipidemia 01/05/2007   Tobacco dependence due to cigarettes 01/05/2007   CORONARY ARTERY DISEASE 01/05/2007   OCCLUSION, VERTEBRAL ARTERY W/O INFARCTION 01/05/2007   Barrett's esophagus 01/05/2007   Osteoarthritis 01/05/2007   LOW BACK PAIN 01/05/2007   Osteoporosis 01/05/2007    Past Surgical History:  Procedure Laterality Date   ABDOMINAL AORTIC ANEURYSM REPAIR N/A 05/02/2015  Procedure: REPAIR ABDOMINAL AORTIC ANEURYSM  ,AORTA BI-ILIAC;  Surgeon: Serafina Mitchell, MD;  Location: [facility];  Service: Vascular;  Laterality: N/A;   ABDOMINAL HYSTERECTOMY  1986   Complete   cataract surgery  2011   COLONOSCOPY     JOINT REPLACEMENT  2012   Right Shoulder and arm    OB History   No obstetric history on file.      Home Medications    Prior to Admission medications   Medication Sig Start Date End Date Taking? Authorizing Provider  Acetaminophen (TYLENOL ARTHRITIS PAIN PO) Take by mouth.    [provider]  amLODipine (NORVASC) 5 MG tablet TAKE 1 TABLET BY MOUTH TWICE  DAILY. Patient not taking: Reported on 03/12/2021 09/04/20   Binnie Rail, MD  calcium-vitamin D (OSCAL WITH D) 500-200 MG-UNIT per tablet Take 1 tablet by mouth daily.   Patient not taking: Reported on 03/12/2021    [provider]  ezetimibe (ZETIA) 10 MG tablet Take 1 tablet (10 mg total) by mouth daily. Patient not taking: Reported on 03/12/2021 05/23/20   Binnie Rail, MD  hydrALAZINE (APRESOLINE) 10 MG tablet Take 1 tablet (10 mg total) by mouth 3 (three) times daily. Patient not taking: Reported on 03/12/2021 05/23/20   Binnie Rail, MD  mirabegron ER (MYRBETRIQ) 25 MG TB24 tablet Take 1 tablet (25 mg total) by mouth daily. Patient not taking: Reported on 03/12/2021 05/23/20   Binnie Rail, MD  Multiple Vitamins-Minerals (MULTIVITAMIN WITH MINERALS) tablet Take 1 tablet by mouth daily.    [provider]  nebivolol (BYSTOLIC) 2.5 MG tablet Take 1 tablet (2.5 mg total) by mouth daily. 11/26/20   Binnie Rail, MD    Family History Family History  Problem Relation Age of Onset   Arthritis Mother    Heart disease Mother    Hypertension Mother    Heart attack Mother    Hyperlipidemia Other    Heart disease Son    Hypertension Son    Cancer Father    Hyperlipidemia Sister    Cancer Brother    Colon cancer Neg Hx     Social History Social History   Tobacco Use   Smoking status: Some Days    Packs/day: 0.25    Years: 57.00    Pack years: 14.25    Types: Cigarettes   Smokeless tobacco: Never  Vaping Use   Vaping Use: Never used  Substance Use Topics   Alcohol use: No    Alcohol/week: 0.0 standard drinks   Drug use: No     Allergies   Penicillins, Aleve [naproxen], Benazepril hcl, Carvedilol, Indomethacin, Pravastatin sodium, Rosuvastatin, Simvastatin, Tramadol, and Advil [ibuprofen]   Review of Systems Review of Systems  Constitutional:  Positive for activity change. Negative for appetite change, fatigue and fever.  Eyes:  Negative for  photophobia and visual disturbance.  Respiratory:  Negative for cough and shortness of breath.   Cardiovascular:  Negative for chest pain.  Gastrointestinal:  Negative for abdominal pain, diarrhea, nausea and vomiting.  Musculoskeletal:  Positive for arthralgias, back pain and gait problem. Negative for myalgias.  Neurological:  Negative for dizziness, light-headedness and headaches.    Physical Exam Triage Vital Signs ED Triage Vitals  Enc Vitals Group     BP 06/26/21 1904 (!) 145/77     Pulse Rate 06/26/21 1904 82     Resp 06/26/21 1904 15     Temp --      Temp  Source 06/26/21 1904 Oral     SpO2 06/26/21 1904 94 %     Weight --      Height --      Head Circumference --      Peak Flow --      Pain Score 06/26/21 1903 10     Pain Loc --      Pain Edu? --      Excl. in [place]? --    No data found.  Updated Vital Signs BP (!) 145/77 (BP Location: Right Arm)    Pulse 82    Resp 15    SpO2 94%   Visual Acuity Right Eye Distance:   Left Eye Distance:   Bilateral Distance:    Right Eye Near:   Left Eye Near:    Bilateral Near:     Physical Exam Vitals reviewed.  Constitutional:      General: She is awake. She is not in acute distress.    Appearance: Normal appearance. She is well-developed. She is not ill-appearing.     Comments: Very pleasant female appears stated age in no acute distress sitting comfortably in exam room in a wheelchair  HENT:     Head: Normocephalic and atraumatic. No raccoon eyes, Battle's sign or contusion.     Mouth/Throat:     Pharynx: Uvula midline. No oropharyngeal exudate or posterior oropharyngeal erythema.  Eyes:     Extraocular Movements: Extraocular movements intact.     Pupils: Pupils are equal, round, and reactive to light.  Cardiovascular:     Rate and Rhythm: Normal rate and regular rhythm.     Heart sounds: Normal heart sounds, S1 normal and S2 normal. No murmur heard. Pulmonary:     Effort: Pulmonary effort is normal.     Breath  sounds: Normal breath sounds. No wheezing, rhonchi or rales.     Comments: Clear to auscultation bilaterally Abdominal:     General: Bowel sounds are normal.     Palpations: Abdomen is soft.     Tenderness: There is no abdominal tenderness.     Comments: Benign abdominal exam  Musculoskeletal:     Cervical back: Normal range of motion and neck supple. No tenderness or bony tenderness. No spinous process tenderness or muscular tenderness.     Thoracic back: No tenderness or bony tenderness.     Lumbar back: Tenderness and bony tenderness present. Decreased range of motion.     Left hip: Tenderness and bony tenderness present. No deformity. Normal range of motion. Normal strength.     Comments: Pain percussion of lumbar vertebrae.  No deformity or step-off noted.  Tenderness palpation over left lumbar paraspinal muscles and iliac crest.  Strength 5/5 bilateral lower extremities.  No significant bruising on exam.  Psychiatric:        Behavior: Behavior is cooperative.     UC Treatments / Results  Labs (all labs ordered are listed, but only abnormal results are displayed) Labs Reviewed - No data to display  EKG   Radiology DG Lumbar Spine Complete  Result Date: 06/26/2021 CLINICAL DATA:  Fall getting out of bathtub 2 days ago, back pain EXAM: LUMBAR SPINE - COMPLETE 4+ VIEW COMPARISON:  CTA abdomen/pelvis dated 04/07/2015 FINDINGS: Five lumbar-type vertebral bodies. Normal lumbar lordosis. Mild superior endplate compression fracture deformity at L1, age indeterminate but new from 2016. Approximately 15% loss of height. No retropulsion. Vertebral body heights are otherwise maintained. Visualized bony pelvis appears intact. IMPRESSION: Mild superior endplate compression fracture deformity  at L1, age indeterminate but possibly new. Correlate with the site of the patient's pain. Electronically Signed   By: Julian Hy M.D.   On: 06/26/2021 19:55   DG Hip Unilat With Pelvis 2-3 Views  Left  Result Date: 06/26/2021 CLINICAL DATA:  Fall and hip pain. EXAM: DG HIP (WITH OR WITHOUT PELVIS) 2-3V LEFT COMPARISON:  None. FINDINGS: There is no acute fracture or dislocation. The bones are osteopenic. Moderate arthritic changes of the hips. There is degenerative changes of the visualized lower lumbar spine. The soft tissues are unremarkable. IMPRESSION: No acute fracture or dislocation. Electronically Signed   By: Anner Crete M.D.   On: 06/26/2021 19:53    Procedures Procedures (including critical care time)  Medications Ordered in UC Medications - No data to display  Initial Impression / Assessment and Plan / UC Course  I have reviewed the triage vital signs and the nursing notes.  Pertinent labs & imaging results that were available during my care of the patient were reviewed by me and considered in my medical decision making (see chart for details).  Clinical Course as of 06/26/21 2021  Fri Jun 26, 2021  2007 DG Lumbar Spine Complete [ER]    Clinical Course User Index [ER] Velera Lansdale, Derry Skill, PA-C    Discussed with patient that if she had any concern for head injury she would need to go to the emergency room for head CT given age, however, she reports that she is absolutely confident that she did not hit her head and declined emergency evaluation today.  X-rays of lumbar back and hip obtained given tenderness on exam and mechanism of injury which showed no osseous abnormality of hip but did show mild superior endplate compression fracture at L1.  Patient denies any red flag signs or symptoms.  Discussed that given her advanced age and that she lives alone I am uncomfortable prescribing opioid medication due to increased risk of fall.  We will stick with Tylenol for pain relief and we discussed maximum dosage of this medication of 4 g/day.  Patient is agreeable to this as pain is primarily present with position changes.  Discussed that if symptoms or not improving we can consider  kyphoplasty and she was given contact information for local orthopedic clinic with instruction to call to schedule an appointment if symptoms or not improving over the weekend.  Discussed alarm symptoms that warrant going to the emergency room including severe pain particularly at rest, bowel/bladder continence, bowel/bladder retention, lower extremity weakness, saddle anesthesia.  Recommended follow-up with PCP first thing next week for reevaluation.  Strict return precautions given to which patient and family member expressed understanding.  Final Clinical Impressions(s) / UC Diagnoses   Final diagnoses:  Fall  Compression fracture of L1 vertebra, initial encounter [facility])     Discharge Instructions      She has a compression fracture at L1.  Please use Tylenol for pain relief.  You can take 1000 mg every 8 hours or 650 mg every 4-6 hours (preferably closer to 6 hours).  If you develop any worsening symptoms including severe pain particularly at rest, difficulty going to the bathroom, going to the bathroom on yourself without noticing it, numbness on the inside of your legs, weakness you need to go directly to the emergency room.  If symptoms have not improved by early next week please follow-up with orthopedic specialist for further evaluation and management.     ED Prescriptions   None  PDMP not reviewed this encounter.   Terrilee Croak, PA-C 06/26/21 2021

## 2021-06-26 NOTE — Discharge Instructions (Addendum)
She has a compression fracture at L1.  Please use Tylenol for pain relief.  You can take 1000 mg every 8 hours or 650 mg every 4-6 hours (preferably closer to 6 hours).  If you develop any worsening symptoms including severe pain particularly at rest, difficulty going to the bathroom, going to the bathroom on yourself without noticing it, numbness on the inside of your legs, weakness you need to go directly to the emergency room.  If symptoms have not improved by early next week please follow-up with orthopedic specialist for further evaluation and management.

## 2021-06-26 NOTE — ED Triage Notes (Signed)
Pt reports falling getting out of the bath tub 2 days ago. States she has excruciating pain on her back and states it has been painful sitting down and standing up.

## 2021-06-29 ENCOUNTER — Encounter: Payer: Self-pay | Admitting: Internal Medicine

## 2021-06-29 NOTE — Progress Notes (Signed)
Subjective:    Patient ID: Margaret Foley, female    DOB: [birth year], 87 y.o.   MRN: [id number]  This visit occurred during the SARS-CoV-2 public health emergency.  Safety protocols were in place, including screening questions prior to the visit, additional usage of staff PPE, and extensive cleaning of exam room while observing appropriate contact time as indicated for disinfecting solutions.     HPI The patient is here for follow up of their chronic medical problems, including htn, hld, ckd, prediabetes, OP, tobacco abuse, OAB   She is not longer taking her prescribed medications.  She just stopped getting them refilled and just ended up stopping them.  She does not think that she needs them.    Referral to ortho?- she started having pain in her back after her fall on 2/10.  She did go to the emergency room and was found to have a L1 compression fracture.  She is having pain in her left lower back pain.  Her pain is radiating across her back, which is new which is why she is concerned.  She has difficulty getting up from a chair.  Taking tylenol.  No pain into legs.  No N/T.    Checks BP at home occasionally - usually 135-137/?  She has urinary incontinence.  She has to wear a pad.    Medications and allergies reviewed with patient and updated if appropriate.  Patient Active Problem List   Diagnosis Date Noted   Physical deconditioning 11/26/2020   Drug-induced myopathy 05/23/2020   Poor balance 05/23/2020   Overactive bladder 03/27/2019   Urinary incontinence 03/27/2019   Microscopic colitis 03/24/2018   Syncope 09/21/2017   Prediabetes 03/24/2017   CKD (chronic kidney disease) 02/23/2016   Dizziness 11/05/2015   LBBB (left bundle branch block) 04/16/2015   AAA (abdominal aortic aneurysm) without rupture ([place]) 04/08/2014   PAROXYSMAL ATRIAL FIBRILLATION 02/04/2009   CAROTID ARTERY DISEASE 02/04/2009   HIP PAIN 10/28/2008   Essential hypertension 12/13/2007    PERIPHERAL VASCULAR DISEASE 04/10/2007   B12 deficiency 01/05/2007   Vitamin D deficiency 01/05/2007   Hyperlipidemia 01/05/2007   Tobacco dependence due to cigarettes 01/05/2007   CORONARY ARTERY DISEASE 01/05/2007   OCCLUSION, VERTEBRAL ARTERY W/O INFARCTION 01/05/2007   Barrett's esophagus 01/05/2007   Osteoarthritis 01/05/2007   LOW BACK PAIN 01/05/2007   Osteoporosis 01/05/2007    Current Outpatient Medications on File Prior to Visit  Medication Sig Dispense Refill   Acetaminophen (TYLENOL ARTHRITIS PAIN PO) Take by mouth.     calcium-vitamin D (OSCAL WITH D) 500-200 MG-UNIT per tablet Take 1 tablet by mouth daily.   (Patient not taking: Reported on 07/01/2021)     No current facility-administered medications on file prior to visit.    Past Medical History:  Diagnosis Date   Anxiety    Atrial fibrillation ([place])    Barrett's esophagus    CAD (coronary artery disease)    Cancer (HCC)    Pre cancerous Uterine Tumor   Carotid artery occlusion    Chest pain    atypical normal myovue 2008   COPD (chronic obstructive pulmonary disease) (HCC)    Cough    Depression    Fibromyalgia    GERD (gastroesophageal reflux disease)    Heart murmur    av sclerosis and mild ar echo 2008   Hip pain    History of hiatal hernia    Hyperlipidemia    Hypertension    Insomnia, unspecified  Kidney stone    Lumbago    Myalgia and myositis, unspecified    statin intolerant   Occlusion and stenosis of vertebral artery without mention of cerebral infarction    Osteoarthritis    Osteopenia    Other B-complex deficiencies    Peripheral artery disease ([place]) 12/02/2016   Peripheral vascular disease (HCC)    Shortness of breath dyspnea    Tobacco use disorder    Unspecified vitamin D deficiency     Past Surgical History:  Procedure Laterality Date   ABDOMINAL AORTIC ANEURYSM REPAIR N/A 05/02/2015   Procedure: REPAIR ABDOMINAL AORTIC ANEURYSM  ,AORTA BI-ILIAC;  Surgeon: Serafina Mitchell, MD;  Location: [facility] OR;  Service: Vascular;  Laterality: N/A;   ABDOMINAL HYSTERECTOMY  1986   Complete   cataract surgery  2011   COLONOSCOPY     JOINT REPLACEMENT  2012   Right Shoulder and arm    Social History   Socioeconomic History   Marital status: Single    Spouse name: Not on file   Number of children: 2   Years of education: Not on file   Highest education level: Not on file  Occupational History   Occupation: retired  Tobacco Use   Smoking status: Some Days    Packs/day: 0.25    Years: 57.00    Pack years: 14.25    Types: Cigarettes   Smokeless tobacco: Never  Vaping Use   Vaping Use: Never used  Substance and Sexual Activity   Alcohol use: No    Alcohol/week: 0.0 standard drinks   Drug use: No   Sexual activity: Not Currently  Other Topics Concern   Not on file  Social History Narrative   Regular exercise--yes daily stretching.   Social Determinants of Health   Financial Resource Strain: Not on file  Food Insecurity: Not on file  Transportation Needs: Not on file  Physical Activity: Not on file  Stress: Not on file  Social Connections: Not on file    Family History  Problem Relation Age of Onset   Arthritis Mother    Heart disease Mother    Hypertension Mother    Heart attack Mother    Hyperlipidemia Other    Heart disease Son    Hypertension Son    Cancer Father    Hyperlipidemia Sister    Cancer Brother    Colon cancer Neg Hx     Review of Systems  Constitutional:  Negative for chills and fever.  Respiratory:  Negative for cough, shortness of breath and wheezing.   Cardiovascular:  Negative for chest pain, palpitations and leg swelling.  Gastrointestinal:  Negative for abdominal pain.  Musculoskeletal:  Positive for back pain.  Neurological:  Positive for light-headedness. Negative for weakness and headaches.      Objective:   Vitals:   07/01/21 1007  BP: 140/84  Pulse: 77  Temp: 98.1 F (36.7 C)  SpO2: 97%   BP  Readings from Last 3 Encounters:  07/01/21 140/84  06/26/21 (!) 145/77  11/26/20 132/78   Wt Readings from Last 3 Encounters:  07/01/21 183 lb (83 kg)  11/26/20 171 lb (77.6 kg)  05/23/20 171 lb 6.4 oz (77.7 kg)   Body mass index is 29.54 kg/m.   Physical Exam    Constitutional: Appears well-developed and well-nourished. No distress.  HENT:  Head: Normocephalic and atraumatic.  Neck: Neck supple. No tracheal deviation present. No thyromegaly present.  No cervical lymphadenopathy Cardiovascular: Normal rate, regular rhythm and  normal heart sounds.  2/6 systolic murmur heard. No carotid bruit .  No edema Pulmonary/Chest: Effort normal and breath sounds normal. No respiratory distress. No has no wheezes. No rales.  Skin: Skin is warm and dry. Not diaphoretic.  Psychiatric: Normal mood and affect. Behavior is normal.    DG Lumbar Spine Complete CLINICAL DATA:  Fall getting out of bathtub 2 days ago, back pain  EXAM: LUMBAR SPINE - COMPLETE 4+ VIEW  COMPARISON:  CTA abdomen/pelvis dated 04/07/2015  FINDINGS: Five lumbar-type vertebral bodies.  Normal lumbar lordosis.  Mild superior endplate compression fracture deformity at L1, age indeterminate but new from 2016. Approximately 15% loss of height. No retropulsion.  Vertebral body heights are otherwise maintained.  Visualized bony pelvis appears intact.  IMPRESSION: Mild superior endplate compression fracture deformity at L1, age indeterminate but possibly new. Correlate with the site of the patient's pain.  Electronically Signed   By: Julian Hy M.D.   On: 06/26/2021 19:55 DG Hip Unilat With Pelvis 2-3 Views Left CLINICAL DATA:  Fall and hip pain.  EXAM: DG HIP (WITH OR WITHOUT PELVIS) 2-3V LEFT  COMPARISON:  None.  FINDINGS: There is no acute fracture or dislocation. The bones are osteopenic. Moderate arthritic changes of the hips. There is degenerative changes of the visualized lower lumbar spine.  The soft tissues are unremarkable.  IMPRESSION: No acute fracture or dislocation.  Electronically Signed   By: Anner Crete M.D.   On: 06/26/2021 19:53    Assessment & Plan:    She is due for follow-up with vascular for her abdominal aortic aneurysm, which has been repaired.  Advised her to call and schedule an appointment   I spent 20 minutes dedicated to the care of this patient on the date of this encounter including review of recent labs, imaging and procedures, speciality notes, obtaining history, communicating with the patient, ordering referrals, tests, and documenting clinical information in the EHR   See Problem List for Assessment and Plan of chronic medical problems.

## 2021-06-30 ENCOUNTER — Telehealth: Payer: Self-pay

## 2021-06-30 NOTE — Chronic Care Management (AMB) (Signed)
Chronic Care Management Pharmacy Assistant   Name: Margaret Foley  MRN: [id number] DOB: Aug 31, 1934  Margaret Foley is an 87 y.o. year old female who presents for his follow-up CCM visit with the clinical pharmacist.  Reason for Encounter: Disease State   Conditions to be addressed/monitored: HTN   Recent office visits:  07/01/21 Binnie Rail, MD-PCP (Hypertension) Blood work was ordered.  Medications changes include: Restart your calcium and vitamin d daily  Recent consult visits:  None ID  Hospital visits:  Medication Reconciliation was completed by comparing discharge summary, patients EMR and Pharmacy list, and upon discussion with patient.  Admitted to the hospital on 06/26/21 due to fall and back pain. Discharge date was 06/26/21. Discharged from [facility].   Medications that remain the same after Hospital Discharge:??  -All other medications will remain the same.    Medications: Outpatient Encounter Medications as of 06/30/2021  Medication Sig   Acetaminophen (TYLENOL ARTHRITIS PAIN PO) Take by mouth.   amLODipine (NORVASC) 5 MG tablet TAKE 1 TABLET BY MOUTH TWICE DAILY. (Patient not taking: Reported on 03/12/2021)   calcium-vitamin D (OSCAL WITH D) 500-200 MG-UNIT per tablet Take 1 tablet by mouth daily.   (Patient not taking: Reported on 03/12/2021)   ezetimibe (ZETIA) 10 MG tablet Take 1 tablet (10 mg total) by mouth daily. (Patient not taking: Reported on 03/12/2021)   hydrALAZINE (APRESOLINE) 10 MG tablet Take 1 tablet (10 mg total) by mouth 3 (three) times daily. (Patient not taking: Reported on 03/12/2021)   mirabegron ER (MYRBETRIQ) 25 MG TB24 tablet Take 1 tablet (25 mg total) by mouth daily. (Patient not taking: Reported on 03/12/2021)   Multiple Vitamins-Minerals (MULTIVITAMIN WITH MINERALS) tablet Take 1 tablet by mouth daily.   nebivolol (BYSTOLIC) 2.5 MG tablet Take 1 tablet (2.5 mg total) by mouth daily.   No  facility-administered encounter medications on file as of 06/30/2021.   Reviewed chart prior to disease state call. Spoke with patient regarding BP  Recent Office Vitals: BP Readings from Last 3 Encounters:  06/26/21 (!) 145/77  11/26/20 132/78  05/23/20 128/70   Pulse Readings from Last 3 Encounters:  06/26/21 82  11/26/20 68  05/23/20 60    Wt Readings from Last 3 Encounters:  11/26/20 171 lb (77.6 kg)  05/23/20 171 lb 6.4 oz (77.7 kg)  02/08/20 172 lb 3.2 oz (78.1 kg)     Kidney Function Lab Results  Component Value Date/Time   CREATININE 1.25 (H) 11/26/2020 02:05 PM   CREATININE 1.52 (H) 05/23/2020 02:50 PM   CREATININE 0.89 04/02/2015 07:20 AM   CREATININE 0.93 10/04/2014 07:20 AM   GFR 39.63 (L) 11/26/2020 02:05 PM   GFRNONAA 25 (L) 05/08/2015 02:48 AM   GFRAA 29 (L) 05/08/2015 02:48 AM    BMP Latest Ref Rng & Units 11/26/2020 05/23/2020 03/27/2019  Glucose 70 - 99 mg/dL 94 89 109(H)  BUN 6 - 23 mg/dL 32(H) 37(H) 32(H)  Creatinine 0.40 - 1.20 mg/dL 1.25(H) 1.52(H) 1.28(H)  Sodium 135 - 145 mEq/L 142 140 141  Potassium 3.5 - 5.1 mEq/L 4.3 4.2 4.2  Chloride 96 - 112 mEq/L 107 104 108  CO2 19 - 32 mEq/L 28 28 26   Calcium 8.4 - 10.5 mg/dL 9.8 10.2 9.5    Current antihypertensive regimen:  Nebivolol 2.5 mg Amlodipine 5 mg Hydralazine 10 mg Patient states that she is not taking any blood pressure medication. States that her blood pressure is fine  How often are you checking your Blood Pressure?  Patient states that she hs not been taking blood pressure in a while.  Current home BP readings: Patient had blood pressure taken at Dr. [name] yesterday   What recent interventions/DTPs have been made by any provider to improve Blood Pressure control since last CPP Visit: None noted  Any recent hospitalizations or ED visits since last visit with CPP? Yes, patient had fall 06/26/21, went to urgent care  What diet changes have been made to improve Blood Pressure Control?   Patient states she has not made any changes to her diet  What exercise is being done to improve your Blood Pressure Control?  Patient states that she does not exercise because of the pain in her back  Adherence Review: Is the patient currently on ACE/ARB medication? No Does the patient have >5 day gap between last estimated fill dates? No   Care Gaps: Colonoscopy-NA Diabetic Foot Exam-NA Mammogram-NA Ophthalmology-NA Dexa Scan - 12/18/19 Annual Well Visit - NA Micro albumin-NA Hemoglobin A1c- 11/26/20  Star Rating Drugs: None ID  [place] Pharmacist Assistant [phone]

## 2021-07-01 ENCOUNTER — Ambulatory Visit (INDEPENDENT_AMBULATORY_CARE_PROVIDER_SITE_OTHER): Payer: Medicare HMO | Admitting: Internal Medicine

## 2021-07-01 ENCOUNTER — Other Ambulatory Visit: Payer: Self-pay

## 2021-07-01 VITALS — BP 140/84 | HR 77 | Temp 98.1°F | Ht 66.0 in | Wt 183.0 lb

## 2021-07-01 DIAGNOSIS — E538 Deficiency of other specified B group vitamins: Secondary | ICD-10-CM | POA: Diagnosis not present

## 2021-07-01 DIAGNOSIS — E782 Mixed hyperlipidemia: Secondary | ICD-10-CM | POA: Diagnosis not present

## 2021-07-01 DIAGNOSIS — N1832 Chronic kidney disease, stage 3b: Secondary | ICD-10-CM

## 2021-07-01 DIAGNOSIS — R7303 Prediabetes: Secondary | ICD-10-CM | POA: Diagnosis not present

## 2021-07-01 DIAGNOSIS — I10 Essential (primary) hypertension: Secondary | ICD-10-CM

## 2021-07-01 DIAGNOSIS — N3281 Overactive bladder: Secondary | ICD-10-CM | POA: Diagnosis not present

## 2021-07-01 DIAGNOSIS — F17210 Nicotine dependence, cigarettes, uncomplicated: Secondary | ICD-10-CM | POA: Diagnosis not present

## 2021-07-01 DIAGNOSIS — S32010A Wedge compression fracture of first lumbar vertebra, initial encounter for closed fracture: Secondary | ICD-10-CM | POA: Diagnosis not present

## 2021-07-01 DIAGNOSIS — M810 Age-related osteoporosis without current pathological fracture: Secondary | ICD-10-CM | POA: Diagnosis not present

## 2021-07-01 DIAGNOSIS — J449 Chronic obstructive pulmonary disease, unspecified: Secondary | ICD-10-CM | POA: Diagnosis not present

## 2021-07-01 LAB — HEMOGLOBIN A1C: Hgb A1c MFr Bld: 5.8 % (ref 4.6–6.5)

## 2021-07-01 LAB — CBC WITH DIFFERENTIAL/PLATELET
Basophils Absolute: 0 10*3/uL (ref 0.0–0.1)
Basophils Relative: 0.7 % (ref 0.0–3.0)
Eosinophils Absolute: 0.1 10*3/uL (ref 0.0–0.7)
Eosinophils Relative: 2.8 % (ref 0.0–5.0)
HCT: 43.7 % (ref 36.0–46.0)
Hemoglobin: 14.2 g/dL (ref 12.0–15.0)
Lymphocytes Relative: 27.3 % (ref 12.0–46.0)
Lymphs Abs: 1.3 10*3/uL (ref 0.7–4.0)
MCHC: 32.5 g/dL (ref 30.0–36.0)
MCV: 88.1 fl (ref 78.0–100.0)
Monocytes Absolute: 0.6 10*3/uL (ref 0.1–1.0)
Monocytes Relative: 11.6 % (ref 3.0–12.0)
Neutro Abs: 2.7 10*3/uL (ref 1.4–7.7)
Neutrophils Relative %: 57.6 % (ref 43.0–77.0)
Platelets: 214 10*3/uL (ref 150.0–400.0)
RBC: 4.96 Mil/uL (ref 3.87–5.11)
RDW: 15.5 % (ref 11.5–15.5)
WBC: 4.8 10*3/uL (ref 4.0–10.5)

## 2021-07-01 LAB — VITAMIN B12: Vitamin B-12: 405 pg/mL (ref 211–911)

## 2021-07-01 LAB — VITAMIN D 25 HYDROXY (VIT D DEFICIENCY, FRACTURES): VITD: 28.3 ng/mL — ABNORMAL LOW (ref 30.00–100.00)

## 2021-07-01 NOTE — Patient Instructions (Addendum)
° ° ° °  Blood work was ordered.     Medications changes include :    Restart your calcium and vitamin d daily  You should consider going on a medication for your bones since you have osteoporosis to help improve the strength and help prevent further fractures.     A referral was ordered for neurosurgery to discuss a procedure to help with the pain from your vertebral compression fracture.     Someone from that office will call you to schedule an appointment.    Return in about 6 months (around 12/29/2021) for follow up.

## 2021-07-01 NOTE — Assessment & Plan Note (Signed)
Chronic Check A1c 

## 2021-07-01 NOTE — Assessment & Plan Note (Signed)
Chronic Not currently taking any supplementation Check B12 level

## 2021-07-01 NOTE — Assessment & Plan Note (Signed)
Chronic Wears a pad No longer taking medication and did not want to try a new medication

## 2021-07-01 NOTE — Assessment & Plan Note (Signed)
Chronic CMP 

## 2021-07-01 NOTE — Assessment & Plan Note (Signed)
Chronic No longer taking Zetia Check lipid panel and we can reevaluate if she needs this

## 2021-07-01 NOTE — Assessment & Plan Note (Signed)
Chronic She is not interested in quitting smoking at this time

## 2021-07-01 NOTE — Assessment & Plan Note (Addendum)
Chronic New L1 compression fracture after fall Advised starting a medication to help strengthen bones Advised restarting calcium and vitamin d daily She will think about the above and let me know if she wants to consider medication

## 2021-07-01 NOTE — Assessment & Plan Note (Addendum)
Chronic Has been on 3 medications in the past for blood pressure, but is no longer taking any of them Blood pressure looks reasonably controlled here today Advised her to monitor her blood pressure at home to make sure that it is well controlled We will monitor off medication

## 2021-07-02 ENCOUNTER — Other Ambulatory Visit: Payer: Self-pay | Admitting: Internal Medicine

## 2021-07-02 DIAGNOSIS — E7849 Other hyperlipidemia: Secondary | ICD-10-CM

## 2021-07-02 LAB — COMPREHENSIVE METABOLIC PANEL
ALT: 21 U/L (ref 0–35)
AST: 34 U/L (ref 0–37)
Albumin: 4.3 g/dL (ref 3.5–5.2)
Alkaline Phosphatase: 86 U/L (ref 39–117)
BUN: 22 mg/dL (ref 6–23)
CO2: 27 mEq/L (ref 19–32)
Calcium: 9.9 mg/dL (ref 8.4–10.5)
Chloride: 104 mEq/L (ref 96–112)
Creatinine, Ser: 1.31 mg/dL — ABNORMAL HIGH (ref 0.40–1.20)
GFR: 37.3 mL/min — ABNORMAL LOW (ref >60.00)
Glucose, Bld: 95 mg/dL (ref 70–99)
Potassium: 4.1 mEq/L (ref 3.5–5.1)
Sodium: 142 mEq/L (ref 135–145)
Total Bilirubin: 0.4 mg/dL (ref 0.2–1.2)
Total Protein: 8.4 g/dL — ABNORMAL HIGH (ref 6.0–8.3)

## 2021-07-02 LAB — LDL CHOLESTEROL, DIRECT: Direct LDL: 145 mg/dL

## 2021-07-02 LAB — LIPID PANEL
Cholesterol: 229 mg/dL — ABNORMAL HIGH (ref 0–200)
HDL: 45.7 mg/dL (ref >39.00)
NonHDL: 182.86
Total CHOL/HDL Ratio: 5
Triglycerides: 211 mg/dL — ABNORMAL HIGH (ref 0.0–149.0)
VLDL: 42.2 mg/dL — ABNORMAL HIGH (ref 0.0–40.0)

## 2021-07-02 MED ORDER — ROSUVASTATIN CALCIUM 10 MG PO TABS: 10.0000 mg | ORAL_TABLET | Freq: Every day | ORAL | 3 refills | Status: DC

## 2021-08-19 ENCOUNTER — Telehealth: Payer: Self-pay

## 2021-08-19 NOTE — Progress Notes (Signed)
? ? ?Chronic Care Management ?Pharmacy Assistant  ? ?Name: Margaret Foley  MRN: [id number] DOB: Dec 04, 1934 ? ?Margaret Foley is an 87 y.o. year old female who was called for her follow-up assessment call. ? ?Reason for Encounter: Disease State ?  ?Conditions to be addressed/monitored: ?HTN ? ? ?Recent office visits:  ?07/01/21 Binnie Rail, MD-PCP (Essential hypertension) Blood work was ordered.  Medications changes:Restart your calcium and vitamin d daily ? ?Recent consult visits:  ?None ID ? ?Hospital visits:  ?None since the last coordination call ? ?Medications: ?Outpatient Encounter Medications as of 08/19/2021  ?Medication Sig  ? Acetaminophen (TYLENOL ARTHRITIS PAIN PO) Take by mouth.  ? calcium-vitamin D (OSCAL WITH D) 500-200 MG-UNIT per tablet Take 1 tablet by mouth daily.   (Patient not taking: Reported on 07/01/2021)  ? rosuvastatin (CRESTOR) 10 MG tablet Take 1 tablet (10 mg total) by mouth daily.  ? ?No facility-administered encounter medications on file as of 08/19/2021.  ? ? ?Recent Office Vitals: ?BP Readings from Last 3 Encounters:  ?07/01/21 140/84  ?06/26/21 (!) 145/77  ?11/26/20 132/78  ? ?Pulse Readings from Last 3 Encounters:  ?07/01/21 77  ?06/26/21 82  ?11/26/20 68  ?  ?Wt Readings from Last 3 Encounters:  ?07/01/21 183 lb (83 kg)  ?11/26/20 171 lb (77.6 kg)  ?05/23/20 171 lb 6.4 oz (77.7 kg)  ?  ? ?Kidney Function ?Lab Results  ?Component Value Date/Time  ? CREATININE 1.31 (H) 07/01/2021 10:59 AM  ? CREATININE 1.25 (H) 11/26/2020 02:05 PM  ? CREATININE 0.89 04/02/2015 07:20 AM  ? CREATININE 0.93 10/04/2014 07:20 AM  ? GFR 37.30 (L) 07/01/2021 10:59 AM  ? GFRNONAA 25 (L) 05/08/2015 02:48 AM  ? GFRAA 29 (L) 05/08/2015 02:48 AM  ? ? ? ?  Latest Ref Rng & Units 07/01/2021  ? 10:59 AM 11/26/2020  ?  2:05 PM 05/23/2020  ?  2:50 PM  ?BMP  ?Glucose 70 - 99 mg/dL 95   94   89    ?BUN 6 - 23 mg/dL 22   32   37    ?Creatinine 0.40 - 1.20 mg/dL 1.31   1.25   1.52    ?Sodium 135 - 145 mEq/L 142   142   140     ?Potassium 3.5 - 5.1 mEq/L 4.1   4.3   4.2    ?Chloride 96 - 112 mEq/L 104   107   104    ?CO2 19 - 32 mEq/L '27   28   28    '$ ?Calcium 8.4 - 10.5 mg/dL 9.9   9.8   10.2    ? ? ?Have you had any problems recently with your health?Patient states that she is doing well and does not have any new health issues. She states that she is only on one medications which is rosuvastatin. ? ?Have you had any problems with your pharmacy?Patient states that sh has used [facility] for years and has not had any problems getting her medications or the cost of medications ? ?What issues or side effects are you having with your medications?Patient states that she does not have any side effects at all ? ?What would you like me to pass along to [facility] for them to help you with?Patient states that she is doing well and ready to get out in her yard and do some yard work  ? ?What can we do to take care of you better? Patient states that she does not  need anything at this time ? ?Adherence Review: ?Is the patient currently on ACE/ARB medication? No ?Does the patient have >5 day gap between last estimated fill dates? No ? ?  ?Care Gaps: ?Colonoscopy-NA ?Diabetic Foot Exam-NA ?Mammogram-NA ?Ophthalmology-NA ?Dexa Scan - 12/18/19 ?Annual Well Visit - NA ?Micro albumin-NA ?Hemoglobin A1c- 07/01/21 ? ?Star Rating Drugs: ?None ID ? ?Ethelene Hal ?Clinical Pharmacist Assistant ?[phone]  ?

## 2021-09-21 ENCOUNTER — Telehealth: Payer: Medicare HMO

## 2021-12-30 NOTE — Progress Notes (Signed)
      Subjective:    Patient ID: Margaret Foley, female    DOB: 1934/05/29, 87 y.o.   MRN: [id number]     HPI Margaret Foley is here for follow up of her chronic medical problems, including htn, hld, CKD, prediabetes, OP, tobacco abuse, OAB  Refer to nephro?  Medications and allergies reviewed with patient and updated if appropriate.  Current Outpatient Medications on File Prior to Visit  Medication Sig Dispense Refill   Acetaminophen (TYLENOL ARTHRITIS PAIN PO) Take by mouth.     calcium-vitamin D (OSCAL WITH D) 500-200 MG-UNIT per tablet Take 1 tablet by mouth daily.   (Patient not taking: Reported on 07/01/2021)     rosuvastatin (CRESTOR) 10 MG tablet Take 1 tablet (10 mg total) by mouth daily. 90 tablet 3   No current facility-administered medications on file prior to visit.     Review of Systems     Objective:  There were no vitals filed for this visit. BP Readings from Last 3 Encounters:  07/01/21 140/84  06/26/21 (!) 145/77  11/26/20 132/78   Wt Readings from Last 3 Encounters:  07/01/21 183 lb (83 kg)  11/26/20 171 lb (77.6 kg)  05/23/20 171 lb 6.4 oz (77.7 kg)   There is no height or weight on file to calculate BMI.    Physical Exam     Lab Results  Component Value Date   WBC 4.8 07/01/2021   HGB 14.2 07/01/2021   HCT 43.7 07/01/2021   PLT 214.0 07/01/2021   GLUCOSE 95 07/01/2021   CHOL 229 (H) 07/01/2021   TRIG 211.0 (H) 07/01/2021   HDL 45.70 07/01/2021   LDLDIRECT 145.0 07/01/2021   LDLCALC 121 (H) 05/23/2020   ALT 21 07/01/2021   AST 34 07/01/2021   NA 142 07/01/2021   K 4.1 07/01/2021   CL 104 07/01/2021   CREATININE 1.31 (H) 07/01/2021   BUN 22 07/01/2021   CO2 27 07/01/2021   TSH 1.50 05/23/2020   INR 1.54 (H) 05/02/2015   HGBA1C 5.8 07/01/2021     Assessment & Plan:    See Problem List for Assessment and Plan of chronic medical problems.   This encounter was created in error - please disregard.

## 2021-12-30 NOTE — Patient Instructions (Signed)
     Blood work was ordered.     Medications changes include :   none   Your prescription(s) have been sent to your pharmacy.    A referral was ordered for [facility] for a colonoscopy.     Someone from that office will call you to schedule an appointment.    Return in about 6 months (around 06/17/2022) for Physical Exam.   [facility] Phone: [phone]  

## 2022-01-04 ENCOUNTER — Encounter: Payer: Self-pay | Admitting: Internal Medicine

## 2022-01-04 ENCOUNTER — Encounter: Payer: Medicare HMO | Admitting: Internal Medicine

## 2022-01-04 DIAGNOSIS — N1832 Chronic kidney disease, stage 3b: Secondary | ICD-10-CM

## 2022-01-04 DIAGNOSIS — N3281 Overactive bladder: Secondary | ICD-10-CM

## 2022-01-04 DIAGNOSIS — M8000XD Age-related osteoporosis with current pathological fracture, unspecified site, subsequent encounter for fracture with routine healing: Secondary | ICD-10-CM

## 2022-01-04 DIAGNOSIS — I10 Essential (primary) hypertension: Secondary | ICD-10-CM

## 2022-01-04 DIAGNOSIS — R7303 Prediabetes: Secondary | ICD-10-CM

## 2022-01-04 DIAGNOSIS — E782 Mixed hyperlipidemia: Secondary | ICD-10-CM

## 2022-01-11 ENCOUNTER — Telehealth: Payer: Self-pay

## 2022-01-11 NOTE — Telephone Encounter (Signed)
LM for patient to return my call to schedule Medicare Wellness visit (AWV-S) at any time with either Nurse AWV or Nurse Health Advisor.

## 2022-02-24 ENCOUNTER — Telehealth: Payer: Medicare HMO

## 2022-04-27 ENCOUNTER — Telehealth: Payer: Self-pay | Admitting: Internal Medicine

## 2022-04-27 NOTE — Telephone Encounter (Signed)
Line continues to be busy, unable to leave a message for patient to call back to schedule Medicare Annual Wellness Visit   Last AWV  02/08/20  Please schedule at anytime with [facility] if patient calls the office back.     Any questions, please call me at [phone]

## 2022-06-10 ENCOUNTER — Telehealth: Payer: Self-pay

## 2022-06-10 NOTE — Telephone Encounter (Signed)
Line continued to be busy, unable to reach patient or leave a message for patient to call back to schedule Medicare Annual Wellness Visit   Last AWV  02/08/20  Please schedule at anytime with [facility] if patient calls the office back.    30 Minutes appointment   Any questions, please call me at [phone]

## 2022-07-17 IMAGING — DX DG LUMBAR SPINE COMPLETE 4+V
5 series · 5 of 5 positions shown · non-contrast
Comparison: CTA abdomen/pelvis dated 04/07/2015

CLINICAL DATA: Fall getting out of bathtub 2 days ago, back pain

EXAM:
LUMBAR SPINE - COMPLETE 4+ VIEW

[l-spine ap]
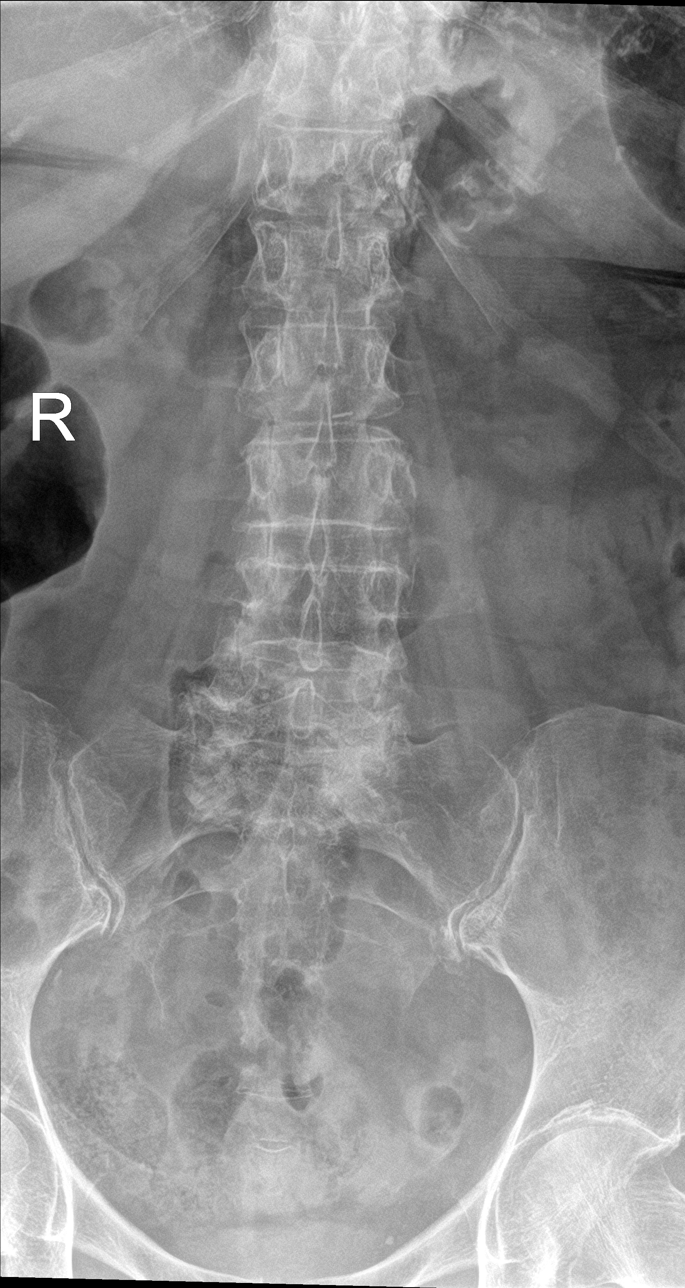

[l-spine obl (1 of 2)]
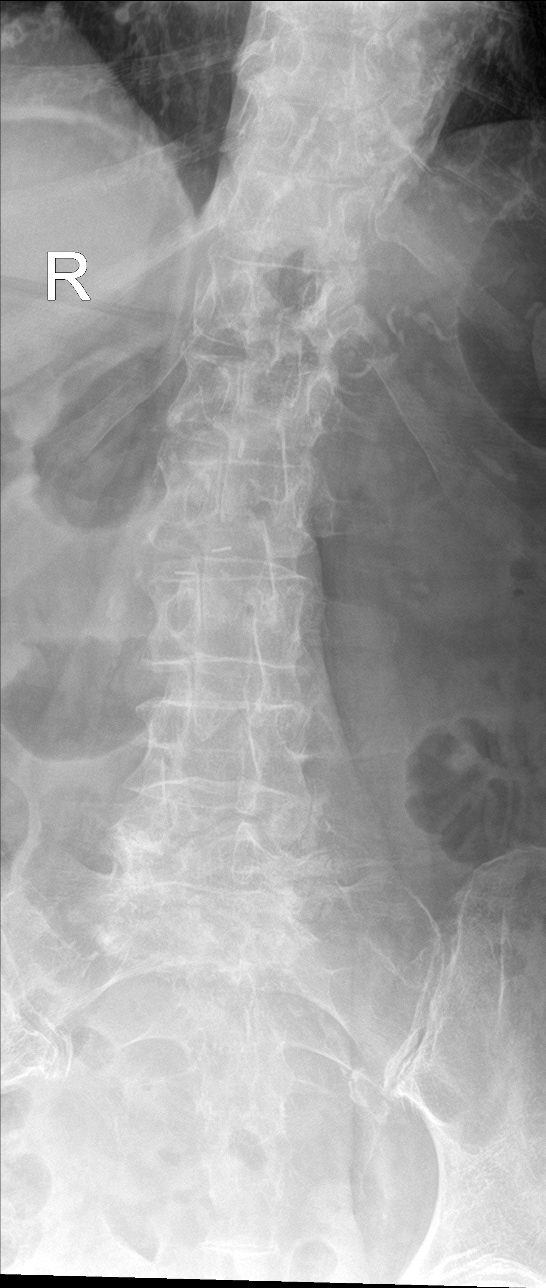

[l-spine obl (2 of 2)]
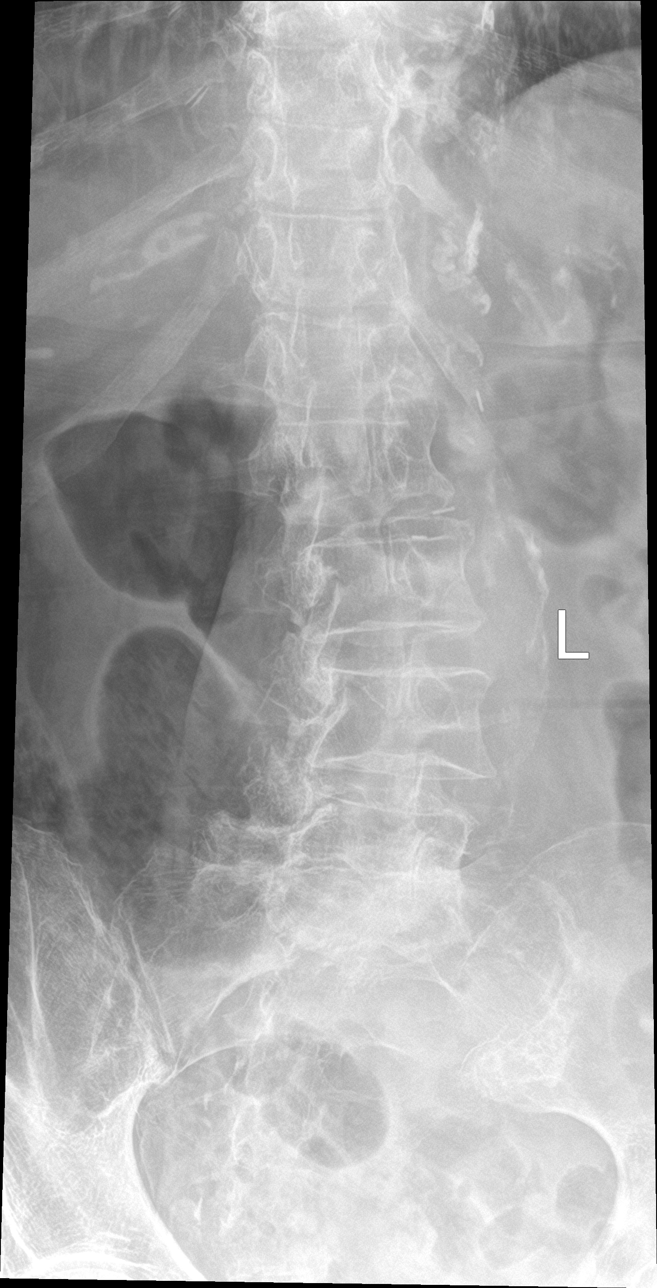

[l-spine lat (1 of 2)]
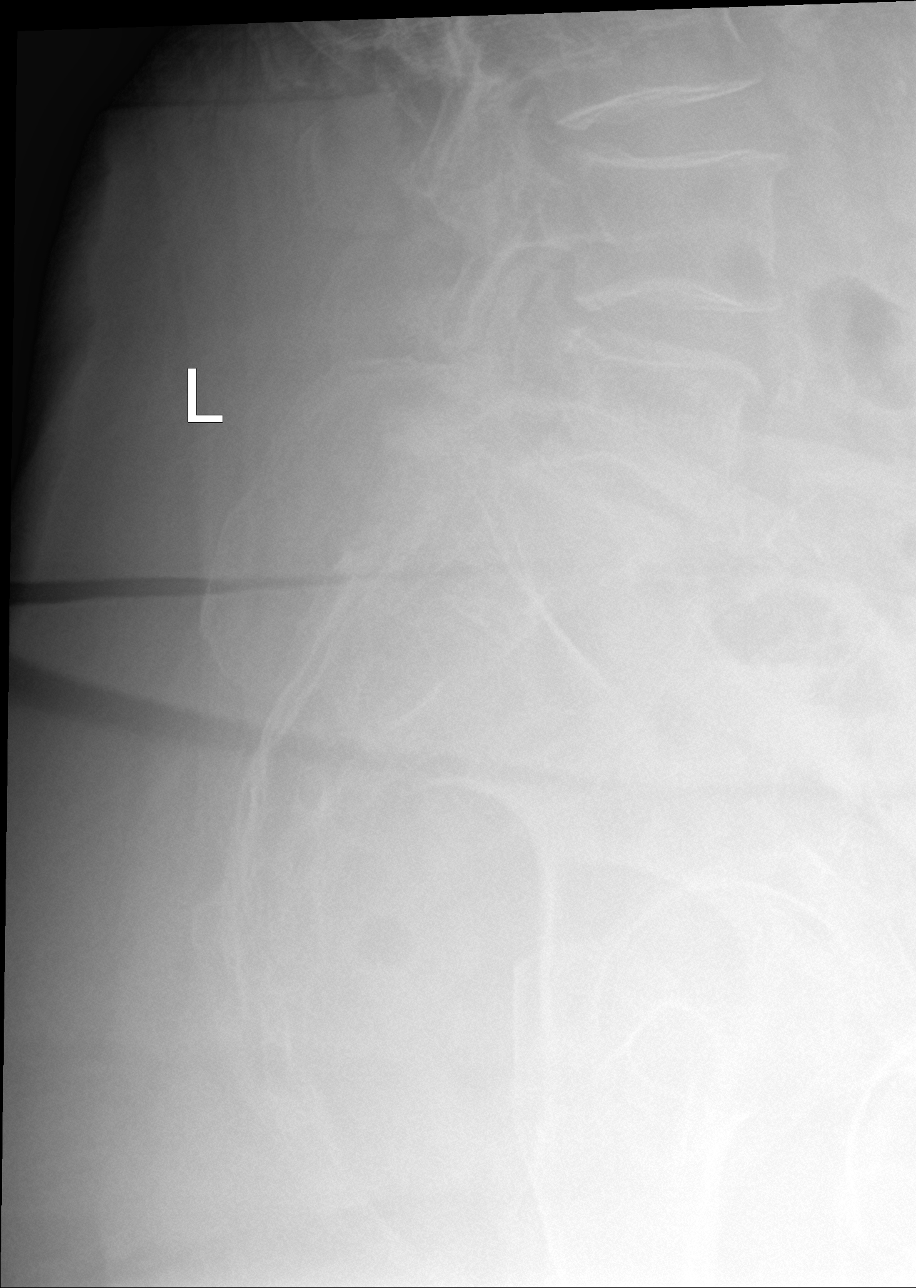

[l-spine lat (2 of 2)]
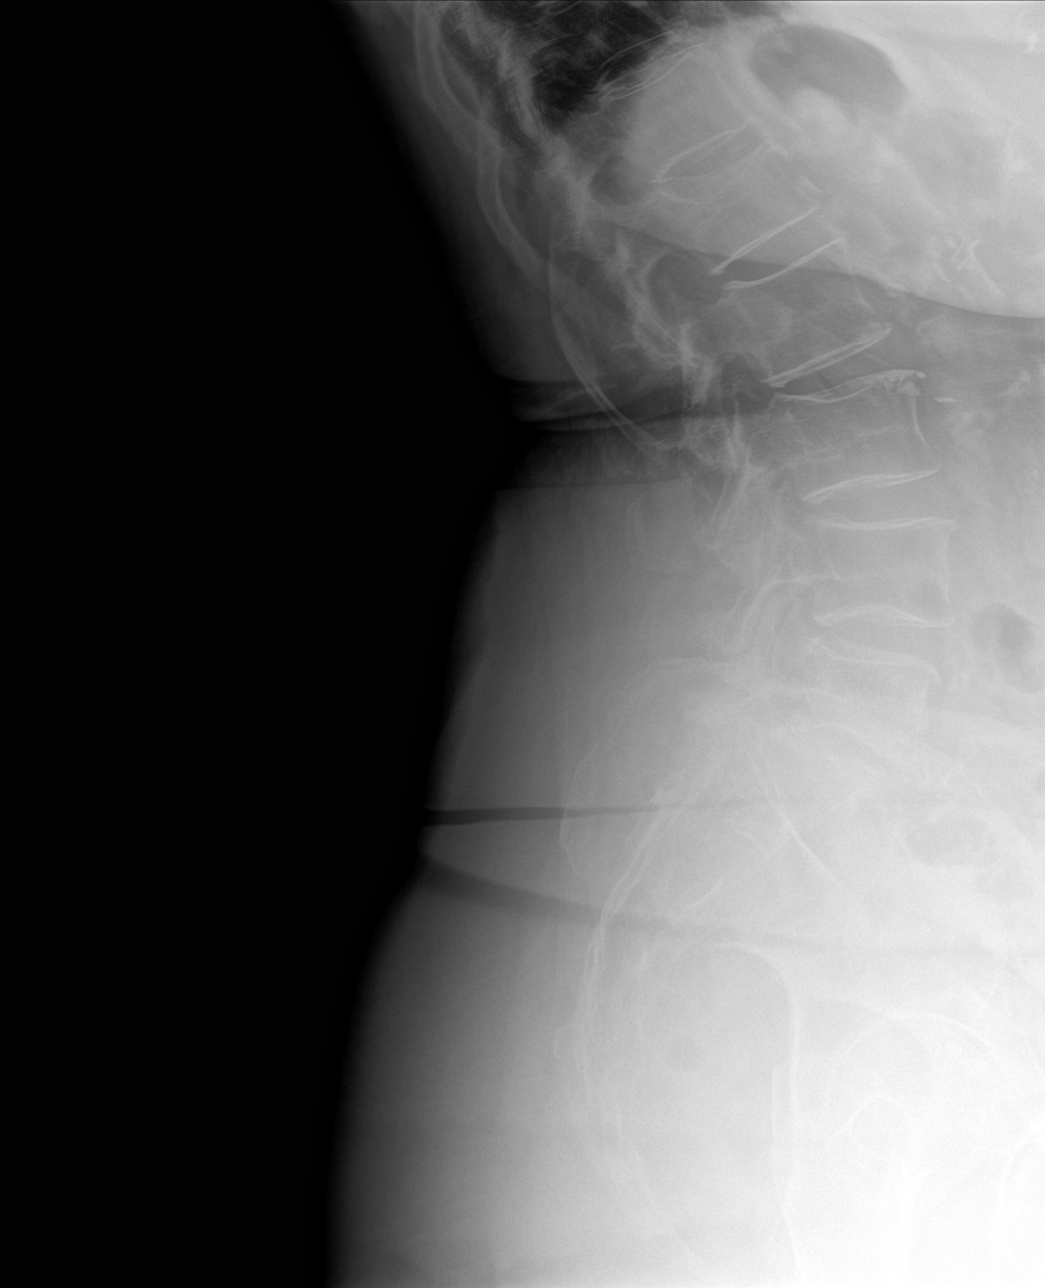

[5 of 5 positions shown; findings below may reference images not displayed]

FINDINGS: Five lumbar-type vertebral bodies.

Normal lumbar lordosis.

Mild superior endplate compression fracture deformity at L1, age
indeterminate but new from 8904. Approximately 15% loss of height.
No retropulsion.

Vertebral body heights are otherwise maintained.

Visualized bony pelvis appears intact.
IMPRESSION: Mild superior endplate compression fracture deformity at L1, age
indeterminate but possibly new. Correlate with the site of the
patient's pain.

## 2022-10-03 ENCOUNTER — Encounter: Payer: Self-pay | Admitting: Internal Medicine

## 2022-10-03 NOTE — Patient Instructions (Addendum)
      Blood work was ordered.   The lab is on the first floor.    Medications changes include :   restart your cholesterol medication (crestor) and calcium and vitamin daily.  Use the cream in your groin area for the yeast infection    A referral was ordered for PT  and a Gynecologist and someone will call you to schedule an appointment.     Return in about 6 months (around 04/06/2023) for Physical Exam.

## 2022-10-03 NOTE — Progress Notes (Unsigned)
Subjective:    Patient ID: Margaret Foley, female    DOB: [birth year], 88 y.o.   MRN: [id number]     HPI Margaret Foley is here for follow up of her chronic medical problems.  Has noticed a bulge in her vagina.  When she stands from sitting she can feel it.  No pain.  No difficulty  urinating.    Does a little yard work, house work -- no exercise.  She is unsteady.  She is not currently taking any medications or supplements.  Medications and allergies reviewed with patient and updated if appropriate.  Current Outpatient Medications on File Prior to Visit  Medication Sig Dispense Refill   Acetaminophen (TYLENOL ARTHRITIS PAIN PO) Take by mouth. (Patient not taking: Reported on 10/04/2022)     calcium-vitamin D (OSCAL WITH D) 500-200 MG-UNIT per tablet Take 1 tablet by mouth daily.   (Patient not taking: Reported on 07/01/2021)     rosuvastatin (CRESTOR) 10 MG tablet Take 1 tablet (10 mg total) by mouth daily. (Patient not taking: Reported on 10/04/2022) 90 tablet 3   No current facility-administered medications on file prior to visit.     Review of Systems  Constitutional:  Negative for fever.  Respiratory:  Negative for cough, shortness of breath and wheezing.   Cardiovascular:  Negative for chest pain, palpitations and leg swelling.  Gastrointestinal:  Negative for constipation and diarrhea.  Genitourinary:  Positive for frequency. Negative for difficulty urinating, dysuria and hematuria.       Feels she is emptying her bladder  Musculoskeletal:  Positive for gait problem (balance is off - leans to the right). Negative for arthralgias (no knee or pain) and back pain.  Neurological:  Negative for weakness, light-headedness, numbness and headaches.       Objective:   Vitals:   10/04/22 1000  BP: 136/74  Pulse: 73  Temp: 98 F (36.7 C)  SpO2: 94%   BP Readings from Last 3 Encounters:  10/04/22 136/74  07/01/21 140/84  06/26/21 (!) 145/77   Wt Readings from Last 3  Encounters:  10/04/22 180 lb (81.6 kg)  07/01/21 183 lb (83 kg)  11/26/20 171 lb (77.6 kg)   Body mass index is 29.05 kg/m.    Physical Exam Constitutional:      General: She is not in acute distress.    Appearance: Normal appearance.  HENT:     Head: Normocephalic and atraumatic.  Eyes:     Conjunctiva/sclera: Conjunctivae normal.  Cardiovascular:     Rate and Rhythm: Normal rate and regular rhythm.     Heart sounds: Normal heart sounds.  Pulmonary:     Effort: Pulmonary effort is normal. No respiratory distress.     Breath sounds: Normal breath sounds. No wheezing.  Genitourinary:    Comments: Prolapse evident in vagina Musculoskeletal:     Cervical back: Neck supple.     Right lower leg: No edema.     Left lower leg: No edema.  Lymphadenopathy:     Cervical: No cervical adenopathy.  Skin:    General: Skin is warm and dry.     Findings: Rash (Tinea bilateral groin regions and perineum) present.  Neurological:     Mental Status: She is alert. Mental status is at baseline.  Psychiatric:        Mood and Affect: Mood normal.        Behavior: Behavior normal.        Lab Results  Component Value  Date   WBC 4.8 07/01/2021   HGB 14.2 07/01/2021   HCT 43.7 07/01/2021   PLT 214.0 07/01/2021   GLUCOSE 95 07/01/2021   CHOL 229 (H) 07/01/2021   TRIG 211.0 (H) 07/01/2021   HDL 45.70 07/01/2021   LDLDIRECT 145.0 07/01/2021   LDLCALC 121 (H) 05/23/2020   ALT 21 07/01/2021   AST 34 07/01/2021   NA 142 07/01/2021   K 4.1 07/01/2021   CL 104 07/01/2021   CREATININE 1.31 (H) 07/01/2021   BUN 22 07/01/2021   CO2 27 07/01/2021   TSH 1.50 05/23/2020   INR 1.54 (H) 05/02/2015   HGBA1C 5.8 07/01/2021     Assessment & Plan:    See Problem List for Assessment and Plan of chronic medical problems.

## 2022-10-04 ENCOUNTER — Ambulatory Visit (INDEPENDENT_AMBULATORY_CARE_PROVIDER_SITE_OTHER): Payer: Medicare HMO | Admitting: Internal Medicine

## 2022-10-04 VITALS — BP 136/74 | HR 73 | Temp 98.0°F | Ht 66.0 in | Wt 180.0 lb

## 2022-10-04 DIAGNOSIS — I10 Essential (primary) hypertension: Secondary | ICD-10-CM

## 2022-10-04 DIAGNOSIS — B356 Tinea cruris: Secondary | ICD-10-CM

## 2022-10-04 DIAGNOSIS — R5381 Other malaise: Secondary | ICD-10-CM | POA: Diagnosis not present

## 2022-10-04 DIAGNOSIS — M8000XD Age-related osteoporosis with current pathological fracture, unspecified site, subsequent encounter for fracture with routine healing: Secondary | ICD-10-CM | POA: Diagnosis not present

## 2022-10-04 DIAGNOSIS — R2689 Other abnormalities of gait and mobility: Secondary | ICD-10-CM

## 2022-10-04 DIAGNOSIS — E538 Deficiency of other specified B group vitamins: Secondary | ICD-10-CM | POA: Diagnosis not present

## 2022-10-04 DIAGNOSIS — N1832 Chronic kidney disease, stage 3b: Secondary | ICD-10-CM | POA: Diagnosis not present

## 2022-10-04 DIAGNOSIS — R7303 Prediabetes: Secondary | ICD-10-CM

## 2022-10-04 DIAGNOSIS — N8110 Cystocele, unspecified: Secondary | ICD-10-CM

## 2022-10-04 DIAGNOSIS — E782 Mixed hyperlipidemia: Secondary | ICD-10-CM

## 2022-10-04 LAB — CBC WITH DIFFERENTIAL/PLATELET
Basophils Absolute: 0 10*3/uL (ref 0.0–0.1)
Basophils Relative: 0.6 % (ref 0.0–3.0)
Eosinophils Absolute: 0.1 10*3/uL (ref 0.0–0.7)
Eosinophils Relative: 1.8 % (ref 0.0–5.0)
HCT: 43.6 % (ref 36.0–46.0)
Hemoglobin: 14.8 g/dL (ref 12.0–15.0)
Lymphocytes Relative: 26.1 % (ref 12.0–46.0)
Lymphs Abs: 1.6 10*3/uL (ref 0.7–4.0)
MCHC: 33.9 g/dL (ref 30.0–36.0)
MCV: 87.8 fl (ref 78.0–100.0)
Monocytes Absolute: 0.7 10*3/uL (ref 0.1–1.0)
Monocytes Relative: 11.5 % (ref 3.0–12.0)
Neutro Abs: 3.8 10*3/uL (ref 1.4–7.7)
Neutrophils Relative %: 60 % (ref 43.0–77.0)
Platelets: 206 10*3/uL (ref 150.0–400.0)
RBC: 4.97 Mil/uL (ref 3.87–5.11)
RDW: 15.5 % (ref 11.5–15.5)
WBC: 6.2 10*3/uL (ref 4.0–10.5)

## 2022-10-04 LAB — LIPID PANEL
Cholesterol: 215 mg/dL — ABNORMAL HIGH (ref 0–200)
HDL: 52.3 mg/dL (ref >39.00)
LDL Cholesterol: 144 mg/dL — ABNORMAL HIGH (ref 0–99)
NonHDL: 162.64
Total CHOL/HDL Ratio: 4
Triglycerides: 95 mg/dL (ref 0.0–149.0)
VLDL: 19 mg/dL (ref 0.0–40.0)

## 2022-10-04 LAB — COMPREHENSIVE METABOLIC PANEL
ALT: 11 U/L (ref 0–35)
AST: 34 U/L (ref 0–37)
Albumin: 4.1 g/dL (ref 3.5–5.2)
Alkaline Phosphatase: 78 U/L (ref 39–117)
BUN: 27 mg/dL — ABNORMAL HIGH (ref 6–23)
CO2: 28 mEq/L (ref 19–32)
Calcium: 10.3 mg/dL (ref 8.4–10.5)
Chloride: 105 mEq/L (ref 96–112)
Creatinine, Ser: 1.2 mg/dL (ref 0.40–1.20)
GFR: 41.08 mL/min — ABNORMAL LOW (ref >60.00)
Glucose, Bld: 103 mg/dL — ABNORMAL HIGH (ref 70–99)
Potassium: 4.6 mEq/L (ref 3.5–5.1)
Sodium: 141 mEq/L (ref 135–145)
Total Bilirubin: 0.5 mg/dL (ref 0.2–1.2)
Total Protein: 7.5 g/dL (ref 6.0–8.3)

## 2022-10-04 LAB — HEMOGLOBIN A1C: Hgb A1c MFr Bld: 5.7 % (ref 4.6–6.5)

## 2022-10-04 MED ORDER — ROSUVASTATIN CALCIUM 10 MG PO TABS: 10.0000 mg | ORAL_TABLET | Freq: Every day | ORAL | 3 refills | Status: DC

## 2022-10-04 MED ORDER — KETOCONAZOLE 2 % EX CREA: 1.0000 | TOPICAL_CREAM | Freq: Every day | CUTANEOUS | 0 refills | Status: DC

## 2022-10-04 NOTE — Assessment & Plan Note (Signed)
New There is evidence of vaginal prolapse on exam Refer to GYN for further evaluation and treatment if needed

## 2022-10-04 NOTE — Assessment & Plan Note (Signed)
Chronic °Has been on 3 medications in the past for blood pressure, but is no longer taking any of them °Blood pressure looks reasonably controlled here today °Advised her to monitor her blood pressure at home to make sure that it is well controlled °We will monitor off medication °

## 2022-10-04 NOTE — Assessment & Plan Note (Signed)
Chronic DEXA due-deferred for today-will discuss again in 6 months She is a high fall risk given her unsteadiness Advised for her to go back to taking calcium and vitamin D daily PT to help with balance, fall prevention

## 2022-10-04 NOTE — Assessment & Plan Note (Addendum)
Chronic No longer taking Zetia Not taking Crestor daily-advised that she restart this and discussed plaque she has in some of her arteries and prevention of heart attack and stroke Crestor 10 mg daily-new prescription sent to pharmacy Will check lipid panel today

## 2022-10-04 NOTE — Assessment & Plan Note (Signed)
Chronic Has a long history of physical deconditioning and poor balance She does do some housework and yard work, but is fairly sedentary Very unsteady and high risk of falls Referred for physical therapy

## 2022-10-04 NOTE — Assessment & Plan Note (Signed)
New This was seen on exam-she denies any itching or symptoms Start ketoconazole cream once daily-I do not think she would be good at a twice daily regimen

## 2022-10-04 NOTE — Assessment & Plan Note (Signed)
Chronic Has a long history of physical deconditioning and poor balance She does do some housework and yard work, but is fairly sedentary Very unsteady and high risk of falls Referred for physical therapy 

## 2022-10-04 NOTE — Assessment & Plan Note (Addendum)
Chronic Not currently seeing nephrology Will check CMP She does not take anything for pain-discussed avoiding NSAIDs Discussed increased water intake

## 2022-10-04 NOTE — Assessment & Plan Note (Signed)
Chronic Check A1c 

## 2022-10-04 NOTE — Assessment & Plan Note (Signed)
Chronic Not supplementing

## 2022-10-05 LAB — VITAMIN D 25 HYDROXY (VIT D DEFICIENCY, FRACTURES): VITD: 27.47 ng/mL — ABNORMAL LOW (ref 30.00–100.00)

## 2022-10-18 ENCOUNTER — Telehealth: Payer: Self-pay | Admitting: Internal Medicine

## 2022-10-18 NOTE — Telephone Encounter (Signed)
Pt called  wanting referral sent to another location because the current location is not taking new patients.Please called son Briara Mcgowin at [phone] with concerns and appt scheduling pt is unable to answer phones calls.

## 2022-10-19 ENCOUNTER — Telehealth: Payer: Self-pay | Admitting: Internal Medicine

## 2022-10-19 NOTE — Telephone Encounter (Signed)
Spoke with son and number given for medical records.

## 2022-10-19 NOTE — Telephone Encounter (Signed)
[phone] 

## 2022-10-19 NOTE — Telephone Encounter (Signed)
Pt son want his mother medical records fax over to [facility]. Pt son ask if we can fax him the form for his mother to fill out so we can release the medical records. Pt son fax number is ATN to:  [facility]ina [phone] and the Gynecologist fax number is [phone]

## 2022-10-28 DIAGNOSIS — N816 Rectocele: Secondary | ICD-10-CM | POA: Diagnosis not present

## 2023-04-04 ENCOUNTER — Ambulatory Visit: Payer: Medicare HMO | Admitting: Internal Medicine

## 2023-04-26 DIAGNOSIS — N819 Female genital prolapse, unspecified: Secondary | ICD-10-CM | POA: Diagnosis not present

## 2023-04-26 DIAGNOSIS — N816 Rectocele: Secondary | ICD-10-CM | POA: Diagnosis not present

## 2023-06-28 ENCOUNTER — Encounter: Payer: Self-pay | Admitting: Sports Medicine

## 2023-06-28 ENCOUNTER — Ambulatory Visit (INDEPENDENT_AMBULATORY_CARE_PROVIDER_SITE_OTHER): Payer: Medicare Other | Admitting: Sports Medicine

## 2023-06-28 VITALS — BP 135/88 | HR 83 | Temp 96.4°F | Resp 18 | Ht 66.0 in | Wt 173.4 lb

## 2023-06-28 DIAGNOSIS — R2689 Other abnormalities of gait and mobility: Secondary | ICD-10-CM | POA: Diagnosis not present

## 2023-06-28 DIAGNOSIS — R413 Other amnesia: Secondary | ICD-10-CM | POA: Diagnosis not present

## 2023-06-28 DIAGNOSIS — E559 Vitamin D deficiency, unspecified: Secondary | ICD-10-CM | POA: Diagnosis not present

## 2023-06-28 DIAGNOSIS — N1831 Chronic kidney disease, stage 3a: Secondary | ICD-10-CM | POA: Diagnosis not present

## 2023-06-28 DIAGNOSIS — R32 Unspecified urinary incontinence: Secondary | ICD-10-CM

## 2023-06-28 NOTE — Progress Notes (Signed)
Careteam: Patient Care Team: Venita Sheffield, MD as PCP - General (Internal Medicine) Valeria Batman, MD (Inactive) (Orthopedic Surgery) Wendall Stade, MD (Cardiology) Kathyrn Sheriff, [facility] (Inactive) as Pharmacist (Pharmacist)  PLACE OF SERVICE:  [facility]  Advanced Directive information Does Patient Have a Medical Advance Directive?: No, Would patient like information on creating a medical advance directive?: No - Patient declined  Allergies  Allergen Reactions   Penicillins Rash    Has patient had a PCN reaction causing immediate rash, facial/tongue/throat swelling, SOB or lightheadedness with hypotension: Yes Has patient had a PCN reaction causing severe rash involving mucus membranes or skin necrosis: No Has patient had a PCN reaction that required hospitalization No Has patient had a PCN reaction occurring within the last 10 years: No If all of the above answers are "NO", then may proceed with Cephalosporin use.    Aleve [Naproxen] Other (See Comments)    Caused scar tissue    Benazepril Hcl     REACTION: cough   Carvedilol     REACTION: ear buzzing ??   Indomethacin    Pravastatin Sodium     REACTION: achy   Rosuvastatin     REACTION: buzzing   Simvastatin     REACTION: myalgia   Tramadol     Ears ringing?   Advil [Ibuprofen] Rash    Chief Complaint  Patient presents with   Establish Care    New patient. Patient did not bring medication because she not medication currently at the moment      Discussed the use of AI scribe software for clinical note transcription with the patient, who gave verbal consent to proceed.  History of Present Illness   Margaret Foley is an 89 year old female who presents to establish care C/o balance issues and memory problems. She is accompanied by her son. Pt lives with other son.  She has been experiencing balance issues for the past year, feeling unsteady on her feet, particularly when walking down the  hall. No dizziness, lightheadedness, or falls are reported. She has a walker at home but does not use it regularly. She has not engaged in physical therapy in the past year.  She reports gradual memory problems, noting difficulty remembering recent conversations and names unless they are particularly interesting or memorable. She stopped driving due to these memory issues and age-related concerns. Her son has been assisting with managing finances and other responsibilities over the past few years. There is a family history of memory issues in older age, but no significant history of dementia or stroke. Mild microvascular changes were noted on a 2017 MRI.  She  c/o rectocele  experiences frequent urination, which she associates with a prolapse. No pain during urination or bowel movement issues. She has normal bowel movements but reports urinating frequently. She has stopped drinking coffee, which she previously consumed in large amounts.        Review of Systems:  Review of Systems  Constitutional:  Negative for chills and fever.  HENT:  Negative for congestion and sore throat.   Eyes:  Negative for double vision.  Respiratory:  Negative for cough, sputum production and shortness of breath.   Cardiovascular:  Negative for chest pain, palpitations and leg swelling.  Gastrointestinal:  Negative for abdominal pain, heartburn and nausea.  Genitourinary:  Positive for frequency. Negative for dysuria and hematuria.  Musculoskeletal:  Negative for falls and myalgias.  Neurological:  Negative for dizziness, sensory change and focal weakness.  Psychiatric/Behavioral:  Positive for memory loss. Negative for depression.    Negative unless indicated in HPI.   Past Medical History:  Diagnosis Date   Anxiety    Atrial fibrillation (HCC)    Barrett's esophagus    CAD (coronary artery disease)    Cancer (HCC)    Pre cancerous Uterine Tumor   Carotid artery occlusion    Chest pain    atypical normal  myovue 2008   COPD (chronic obstructive pulmonary disease) (HCC)    Cough    Depression    Fibromyalgia    GERD (gastroesophageal reflux disease)    Heart murmur    av sclerosis and mild ar echo 2008   Hip pain    History of hiatal hernia    Hyperlipidemia    Hypertension    Insomnia, unspecified    Kidney stone    Lumbago    Myalgia and myositis, unspecified    statin intolerant   Occlusion and stenosis of vertebral artery without mention of cerebral infarction    Osteoarthritis    Osteopenia    Other B-complex deficiencies    Peripheral artery disease (HCC) 12/02/2016   Peripheral vascular disease (HCC)    Shortness of breath dyspnea    Tobacco use disorder    Unspecified vitamin D deficiency    Past Surgical History:  Procedure Laterality Date   ABDOMINAL AORTIC ANEURYSM REPAIR N/A 05/02/2015   Procedure: REPAIR ABDOMINAL AORTIC ANEURYSM  ,AORTA BI-ILIAC;  Surgeon: Nada Libman, MD;  Location: [facility] OR;  Service: Vascular;  Laterality: N/A;   ABDOMINAL HYSTERECTOMY  1986   Complete   cataract surgery  2011   COLONOSCOPY     JOINT REPLACEMENT  2012   Right Shoulder and arm   Social History:   reports that she has been smoking cigarettes. She has a 14.3 pack-year smoking history. She has never used smokeless tobacco. She reports that she does not drink alcohol and does not use drugs.  Family History  Problem Relation Age of Onset   Arthritis Mother    Heart disease Mother    Hypertension Mother    Heart attack Mother    Hyperlipidemia Other    Heart disease Son    Hypertension Son    Cancer Father    Hyperlipidemia Sister    Cancer Brother    Colon cancer Neg Hx     Medications: Patient's Medications  New Prescriptions   No medications on file  Previous Medications   CALCIUM-VITAMIN D (OSCAL WITH D) 500-200 MG-UNIT PER TABLET    Take 1 tablet by mouth daily.     KETOCONAZOLE (NIZORAL) 2 % CREAM    Apply 1 Application topically daily.   ROSUVASTATIN  (CRESTOR) 10 MG TABLET    Take 1 tablet (10 mg total) by mouth daily.  Modified Medications   No medications on file  Discontinued Medications   No medications on file    Physical Exam: Vitals:   06/28/23 1355  BP: 135/88  Pulse: 83  Resp: 18  Temp: (!) 96.4 F (35.8 C)  SpO2: 97%  Weight: 173 lb 6.4 oz (78.7 kg)  Height: 5\' 6"  (1.676 m)   Body mass index is 27.99 kg/m. BP Readings from Last 3 Encounters:  06/28/23 135/88  10/04/22 136/74  07/01/21 140/84   Wt Readings from Last 3 Encounters:  06/28/23 173 lb 6.4 oz (78.7 kg)  10/04/22 180 lb (81.6 kg)  07/01/21 183 lb (83 kg)    Physical Exam  Constitutional:      Appearance: Normal appearance.  HENT:     Head: Normocephalic and atraumatic.  Cardiovascular:     Rate and Rhythm: Normal rate and regular rhythm.  Pulmonary:     Effort: Pulmonary effort is normal. No respiratory distress.     Breath sounds: Normal breath sounds. No wheezing.  Abdominal:     General: Bowel sounds are normal. There is no distension.     Tenderness: There is no abdominal tenderness. There is no guarding or rebound.     Comments:    Musculoskeletal:        General: No swelling or tenderness.  Skin:    General: Skin is dry.  Neurological:     Mental Status: She is alert. Mental status is at baseline.     Sensory: No sensory deficit.     Motor: No weakness.     Labs reviewed: Basic Metabolic Panel: Recent Labs    10/04/22 1056  NA 141  K 4.6  CL 105  CO2 28  GLUCOSE 103*  BUN 27*  CREATININE 1.20  CALCIUM 10.3   Liver Function Tests: Recent Labs    10/04/22 1056  AST 34  ALT 11  ALKPHOS 78  BILITOT 0.5  PROT 7.5  ALBUMIN 4.1   No results for input(s): "LIPASE", "AMYLASE" in the last 8760 hours. No results for input(s): "AMMONIA" in the last 8760 hours. CBC: Recent Labs    10/04/22 1056  WBC 6.2  NEUTROABS 3.8  HGB 14.8  HCT 43.6  MCV 87.8  PLT 206.0   Lipid Panel: Recent Labs    10/04/22 1056   CHOL 215*  HDL 52.30  LDLCALC 144*  TRIG 95.0  CHOLHDL 4   TSH: No results for input(s): "TSH" in the last 8760 hours. A1C: Lab Results  Component Value Date   HGBA1C 5.7 10/04/2022    Assessment and Plan    1. Memory deficits (Primary) Gradual memory problems reported over the past few years, affecting daily activities such as medication management and bill paying. No current medications. MRI from 2017 showed mild microvascular changes. -Order complete blood work including blood counts, thyroid, kidney function, and electrolytes. -Perform memory screening test today. -Order neuropsychological testing for further evaluation. Mmse 23/30 - CBC (no diff) - Basic Metabolic Panel with eGFR - TSH - Vitamin D, 25-hydroxy - Ambulatory referral to Neuropsychology  2. Balance problems Patient reports unsteadiness on feet for the past year, no falls or joint pain. No dizziness or lightheadedness reported. -Order Physical Therapy for balance training and strengthening.  3. Urinary incontinence, unspecified type Increased urinary frequency reported, possibly due to vaginal prolapse or age-related muscle tone loss. -Consider Kegel exercises for pelvic muscle strengthening. -Address further after Gynecology evaluation and treatment of vaginal prolapse.  4. CKD stage 3a, GFR 45-59 ml/min (HCC) Avoid nephrotoxic meds Will check bmp       Return in about 3 months (around 09/25/2023).:

## 2023-06-29 LAB — BASIC METABOLIC PANEL WITH GFR
BUN/Creatinine Ratio: 18 (calc) (ref 6–22)
BUN: 21 mg/dL (ref 7–25)
CO2: 28 mmol/L (ref 20–32)
Calcium: 9.8 mg/dL (ref 8.6–10.4)
Chloride: 106 mmol/L (ref 98–110)
Creat: 1.16 mg/dL — ABNORMAL HIGH (ref 0.60–0.95)
Glucose, Bld: 92 mg/dL (ref 65–99)
Potassium: 4.4 mmol/L (ref 3.5–5.3)
Sodium: 143 mmol/L (ref 135–146)
eGFR: 46 mL/min/{1.73_m2} — ABNORMAL LOW (ref >=60)

## 2023-06-29 LAB — CBC
HCT: 44.3 % (ref 35.0–45.0)
Hemoglobin: 14.7 g/dL (ref 11.7–15.5)
MCH: 29.5 pg (ref 27.0–33.0)
MCHC: 33.2 g/dL (ref 32.0–36.0)
MCV: 89 fL (ref 80.0–100.0)
MPV: 10.4 fL (ref 7.5–12.5)
Platelets: 217 10*3/uL (ref 140–400)
RBC: 4.98 10*6/uL (ref 3.80–5.10)
RDW: 13.8 % (ref 11.0–15.0)
WBC: 6.6 10*3/uL (ref 3.8–10.8)

## 2023-06-29 LAB — TSH: TSH: 1.67 m[IU]/L (ref 0.40–4.50)

## 2023-06-29 LAB — VITAMIN D 25 HYDROXY (VIT D DEFICIENCY, FRACTURES): Vit D, 25-Hydroxy: 37 ng/mL (ref 30–100)

## 2023-07-22 ENCOUNTER — Other Ambulatory Visit (HOSPITAL_COMMUNITY)
Admission: RE | Admit: 2023-07-22 | Discharge: 2023-07-22 | Disposition: A | Discharge status: Home / Self Care | Source: Other Acute Inpatient Hospital | Attending: Obstetrics and Gynecology | Admitting: Obstetrics and Gynecology

## 2023-07-22 ENCOUNTER — Ambulatory Visit: Payer: Medicare HMO | Admitting: Obstetrics and Gynecology

## 2023-07-22 ENCOUNTER — Encounter: Payer: Self-pay | Admitting: Obstetrics and Gynecology

## 2023-07-22 ENCOUNTER — Other Ambulatory Visit (HOSPITAL_COMMUNITY)
Admission: RE | Admit: 2023-07-22 | Discharge: 2023-07-22 | Disposition: A | Discharge status: Home / Self Care | Source: Ambulatory Visit | Attending: Obstetrics and Gynecology | Admitting: Obstetrics and Gynecology

## 2023-07-22 VITALS — BP 180/81 | HR 71 | Ht 62.0 in | Wt 170.0 lb

## 2023-07-22 DIAGNOSIS — B379 Candidiasis, unspecified: Secondary | ICD-10-CM | POA: Diagnosis not present

## 2023-07-22 DIAGNOSIS — R82998 Other abnormal findings in urine: Secondary | ICD-10-CM | POA: Diagnosis not present

## 2023-07-22 DIAGNOSIS — N993 Prolapse of vaginal vault after hysterectomy: Secondary | ICD-10-CM

## 2023-07-22 DIAGNOSIS — N393 Stress incontinence (female) (male): Secondary | ICD-10-CM

## 2023-07-22 DIAGNOSIS — R350 Frequency of micturition: Secondary | ICD-10-CM

## 2023-07-22 LAB — POCT URINALYSIS DIPSTICK
Bilirubin, UA: NEGATIVE
Blood, UA: NEGATIVE
Glucose, UA: NEGATIVE
Ketones, UA: NEGATIVE
Nitrite, UA: NEGATIVE
Protein, UA: NEGATIVE
Spec Grav, UA: 1.02 (ref 1.010–1.025)
Urobilinogen, UA: 0.2 U/dL
pH, UA: 6 (ref 5.0–8.0)

## 2023-07-22 MED ORDER — FLUCONAZOLE 150 MG PO TABS: 150.0000 mg | ORAL_TABLET | Freq: Once | ORAL | 0 refills | Status: AC

## 2023-07-22 MED ORDER — NYSTATIN 100000 UNIT/GM EX POWD: 1.0000 | Freq: Two times a day (BID) | CUTANEOUS | 11 refills | Status: DC

## 2023-07-22 NOTE — Assessment & Plan Note (Signed)
-   For treatment of stress urinary incontinence,  non-surgical options include expectant management, weight loss, physical therapy, as well as a pessary.  Surgical options include a midurethral sling, Burch urethropexy, and transurethral injection of a bulking agent. - Will plan for urodynamics to demonstrate leakage and determine need for a sling.

## 2023-07-22 NOTE — Patient Instructions (Addendum)
 URODYNAMICS (UDS) TEST INFORMATION  IMPORTANT: Please try to arrive with a comfortably full bladder!   What is UDS? Urodynamics is a bladder test used to evaluate how your bladder and urethra (tube you urinate out of) work to help find out the cause of your bladder symptoms and evaluate your bladder function in order to make the best treatment plan for you.   What to expect? A nurse will perform the test and will be with you during the entire exam. First we will have to empty your bladder on a special toilet.  After you have emptied your bladder, very small catheters (plastic tubing) will be placed into your bladder and into your vagina (or rectum). These special small catheters measure pressure to help measure your bladder function.  Your bladder will be gently filled with water and you will be asked to cough and strain at several different points during the test.   You will then be asked to empty your bladder in the special toilet with the catheters in place. Most patients can urinate (pee) easily with the catheters in place since the catheters are so small. In total this procedure lasts about 45 minutes to 1 hour.  After your test is completed, you will return (or possibly be seen the same day) to review the results, talk about treatment options and make a plan moving forward.   For the yeast infection:  - Take diflucan for one dose - Then apply powder in the groin and skin folds  Can apply aquaphor or coconut oil on the vagina or vulvar tissues to help with moisture and prevent irritation

## 2023-07-22 NOTE — Progress Notes (Signed)
 New Patient Evaluation and Consultation  Referring Provider: Zelphia Cairo, MD PCP: Venita Sheffield, MD Date of Service: 07/22/2023  SUBJECTIVE Chief Complaint: New Patient (Initial Visit) Margaret Foley is a 89 y.o. female here for a consult for prolapse. Pt did not leave a urine sample. PVR was 11)  History of Present Illness: Margaret Foley is a 89 y.o. White or Caucasian female seen in consultation at the request of Dr Renaldo Fiddler for evaluation of prolapse.    Review of records significant for: Stage IV prolapse noted  Urinary Symptoms: Leaks urine with cough/ sneeze, lifting, and with urgency Leaks is variable Pad use:  pads  Patient is bothered by UI symptoms.  Day time voids 10.  Nocturia: occaional  Voiding dysfunction:  does not empty bladder well Patient does not use a catheter to empty bladder.  When urinating, patient feels a weak stream, difficulty starting urine stream, and the need to urinate multiple times in a row Drinks: orange juice, milk, water, iced tea- not a lot of fluid total  UTIs:  0  UTI's in the last year.   Denies history of blood in urine. Has history of kidney stones    Pelvic Organ Prolapse Symptoms:                  Patient Admits to a feeling of a bulge the vaginal area. It has been present for 4 months.  Patient Admits to seeing a bulge.  This bulge is bothersome.   Bowel Symptom: Bowel movements: 3-4 time(s) per day Stool consistency: soft, or loose Straining: no.  Splinting: no.  Incomplete evacuation: yes.  Patient Denies accidental bowel leakage / fecal incontinence Bowel regimen: diet  Sexual Function Sexually active: no.    Pelvic Pain Denies pelvic pain  Past Medical History:  Past Medical History:  Diagnosis Date   Anxiety    Atrial fibrillation (HCC)    Barrett's esophagus    CAD (coronary artery disease)    Cancer (HCC)    Pre cancerous Uterine Tumor   Carotid artery occlusion    Chest pain     atypical normal myovue 2008   COPD (chronic obstructive pulmonary disease) (HCC)    Cough    Depression    Fibromyalgia    GERD (gastroesophageal reflux disease)    Heart murmur    av sclerosis and mild ar echo 2008   Hip pain    History of hiatal hernia    Hyperlipidemia    Hypertension    Insomnia, unspecified    Kidney stone    Lumbago    Myalgia and myositis, unspecified    statin intolerant   Occlusion and stenosis of vertebral artery without mention of cerebral infarction    Osteoarthritis    Osteopenia    Other B-complex deficiencies    Peripheral artery disease (HCC) 12/02/2016   Peripheral vascular disease (HCC)    Shortness of breath dyspnea    Tobacco use disorder    Unspecified vitamin D deficiency      Past Surgical History:   Past Surgical History:  Procedure Laterality Date   ABDOMINAL AORTIC ANEURYSM REPAIR N/A 05/02/2015   Procedure: REPAIR ABDOMINAL AORTIC ANEURYSM  ,AORTA BI-ILIAC;  Surgeon: Nada Libman, MD;  Location: [facility] OR;  Service: Vascular;  Laterality: N/A;   ABDOMINAL HYSTERECTOMY  1986   Complete   cataract surgery  2011   COLONOSCOPY     JOINT REPLACEMENT  2012   Right Shoulder and arm  Past OB/GYN History: OB History  Gravida Para Term Preterm AB Living  2 2 2   2   SAB IAB Ectopic Multiple Live Births      2    # Outcome Date GA Lbr Len/2nd Weight Sex Type Anes PTL Lv  2 Term      Vag-Spont     1 Term      Vag-Spont       S/p hysterectomy   Medications: Patient has a current medication list which includes the following prescription(s): fluconazole and nystatin.   Allergies: Patient is allergic to penicillins, aleve [naproxen], benazepril hcl, carvedilol, indomethacin, pravastatin sodium, rosuvastatin, simvastatin, tramadol, and advil [ibuprofen].   Social History:  Social History   Tobacco Use   Smoking status: Some Days    Current packs/day: 0.25    Average packs/day: 0.3 packs/day for 57.0 years (14.3 ttl  pk-yrs)    Types: Cigarettes   Smokeless tobacco: Never  Vaping Use   Vaping status: Never Used  Substance Use Topics   Alcohol use: No    Alcohol/week: 0.0 standard drinks of alcohol   Drug use: No    Relationship status: single Patient lives with son and daughter in [place].   Patient is not employed . Regular exercise: No History of abuse: No  Family History:   Family History  Problem Relation Age of Onset   Arthritis Mother    Heart disease Mother    Hypertension Mother    Heart attack Mother    Hyperlipidemia Other    Heart disease Son    Hypertension Son    Cancer Father    Hyperlipidemia Sister    Cancer Brother    Colon cancer Neg Hx      Review of Systems: Review of Systems  Constitutional:  Negative for fever, malaise/fatigue and weight loss.  Respiratory:  Negative for cough, shortness of breath and wheezing.   Cardiovascular:  Negative for chest pain, palpitations and leg swelling.  Gastrointestinal:  Negative for abdominal pain and blood in stool.  Genitourinary:  Negative for dysuria.  Musculoskeletal:  Positive for myalgias.  Skin:  Negative for rash.  Neurological:  Negative for dizziness and headaches.  Endo/Heme/Allergies:  Does not bruise/bleed easily.  Psychiatric/Behavioral:  Negative for depression. The patient is not nervous/anxious.      OBJECTIVE Physical Exam: Vitals:   07/22/23 1251 07/22/23 1406  BP: (!) 157/77 (!) 180/81  Pulse: 69 71  Weight: 170 lb (77.1 kg)   Height: 5\' 2"  (1.575 m)     Physical Exam Vitals reviewed. Exam conducted with a chaperone present.  Constitutional:      General: She is not in acute distress. Pulmonary:     Effort: Pulmonary effort is normal.  Abdominal:     General: There is no distension.     Palpations: Abdomen is soft.     Tenderness: There is no abdominal tenderness. There is no rebound.     Comments: Erythema in lower abdomen fold  Musculoskeletal:        General: No swelling. Normal range  of motion.  Skin:    General: Skin is warm and dry.     Findings: No rash.  Neurological:     Mental Status: She is alert and oriented to person, place, and time.  Psychiatric:        Mood and Affect: Mood normal.        Behavior: Behavior normal.      GU / Detailed Urogynecologic Evaluation:  Pelvic Exam: Normal external female genitalia; Bartholin's and Skene's glands normal in appearance; urethral meatus normal in appearance, no urethral masses or discharge.   CST: negative  Complete vault prolpase noted. Normal vaginal mucosa with atrophy and normal vaginal cuff, no erosions or lesions present.  Adnexa no mass, fullness, tenderness.   Thick white discharge and erythema in groin folds. Aptima obtained  Pelvic floor strength I/V  Pelvic floor musculature: Right levator non-tender, Right obturator non-tender, Left levator non-tender, Left obturator non-tender  POP-Q:   POP-Q  3                                            Aa   6                                           Ba  6                                              C   6.5                                            Gh  2.5                                            Pb  6                                            tvl   3                                            Ap  6                                            Bp                                                 D    Urethra was prepped with betadine and straight catheter placed. 50cc urine obtained.    Rectal Exam:  Irritation surrounding anus  Post-Void Residual (PVR) by Bladder Scan: In order to evaluate bladder emptying, we discussed obtaining a postvoid residual and patient agreed to this procedure.  Procedure: The ultrasound unit was placed on the patient's abdomen in the suprapubic region after the patient had voided.    Post Void Residual - 07/22/23 1310       Post Void Residual  Post Void Residual 11 mL              Laboratory  Results: Lab Results  Component Value Date   COLORU Yellow 07/22/2023   CLARITYU Clear 07/22/2023   GLUCOSEUR Negative 07/22/2023   BILIRUBINUR Negative 07/22/2023   KETONESU Negative 07/22/2023   SPECGRAV 1.020 07/22/2023   RBCUR Negative 07/22/2023   PHUR 6.0 07/22/2023   PROTEINUR Negative 07/22/2023   UROBILINOGEN 0.2 07/22/2023   LEUKOCYTESUR Small (1+) (A) 07/22/2023    Lab Results  Component Value Date   CREATININE 1.16 (H) 06/28/2023   CREATININE 1.20 10/04/2022   CREATININE 1.31 (H) 07/01/2021    Lab Results  Component Value Date   HGBA1C 5.7 10/04/2022    Lab Results  Component Value Date   HGB 14.7 06/28/2023     ASSESSMENT AND PLAN Ms. Balch is a 89 y.o. with:  1. Vaginal vault prolapse after hysterectomy   2. SUI (stress urinary incontinence, female)   3. Urinary frequency   4. Yeast infection   5. Leukocytes in urine     Vaginal vault prolapse after hysterectomy Assessment & Plan: Stage IV anterior, Stage IV posterior, Stage IV apical prolapse - For treatment of pelvic organ prolapse, we discussed options for management including expectant management, conservative management, and surgical management, such as Kegels, a pessary, pelvic floor physical therapy, and specific surgical procedures. - She is interested in surgery- Recommended Colpocleisis with levator plication and perineorrhaphy, as she is not planning on being sexually active    SUI (stress urinary incontinence, female) Assessment & Plan: - For treatment of stress urinary incontinence,  non-surgical options include expectant management, weight loss, physical therapy, as well as a pessary.  Surgical options include a midurethral sling, Burch urethropexy, and transurethral injection of a bulking agent. - Will plan for urodynamics to demonstrate leakage and determine need for a sling.    Urinary frequency Assessment & Plan: - Likely having urinary hesitancy and incomplete emptying due to  advanced prolapse. Will evaluate further with urodynamics.  Orders: -     POCT urinalysis dipstick  Yeast infection Assessment & Plan: - For skin yeast infection, will give dose of oral diflucan then instructed to use nystatin powder on the groin and abdominal folds   Orders: -     Fluconazole; Take 1 tablet (150 mg total) by mouth once for 1 dose.  Dispense: 1 tablet; Refill: 0 -     Nystatin; Apply 1 Application topically 2 (two) times daily. Apply to goin or skin folds  Dispense: 60 g; Refill: 11 -     Cervicovaginal ancillary only  Leukocytes in urine -     Urine Culture; Future  Schedule for urodynamics. Request sent for surgery scheduling.    Marguerita Beards, MD

## 2023-07-22 NOTE — Assessment & Plan Note (Signed)
-   For skin yeast infection, will give dose of oral diflucan then instructed to use nystatin powder on the groin and abdominal folds

## 2023-07-22 NOTE — Assessment & Plan Note (Signed)
 Stage IV anterior, Stage IV posterior, Stage IV apical prolapse - For treatment of pelvic organ prolapse, we discussed options for management including expectant management, conservative management, and surgical management, such as Kegels, a pessary, pelvic floor physical therapy, and specific surgical procedures. - She is interested in surgery- Recommended Colpocleisis with levator plication and perineorrhaphy, as she is not planning on being sexually active

## 2023-07-22 NOTE — Assessment & Plan Note (Signed)
-   Likely having urinary hesitancy and incomplete emptying due to advanced prolapse. Will evaluate further with urodynamics.

## 2023-07-25 LAB — CERVICOVAGINAL ANCILLARY ONLY
Bacterial Vaginitis (gardnerella): NEGATIVE
Candida Glabrata: NEGATIVE
Candida Vaginitis: NEGATIVE
Comment: NEGATIVE
Comment: NEGATIVE
Comment: NEGATIVE

## 2023-07-25 LAB — URINE CULTURE: Culture: 50000 — AB

## 2023-07-25 MED ORDER — FLUCONAZOLE 150 MG PO TABS: 150.0000 mg | ORAL_TABLET | Freq: Once | ORAL | 0 refills | Status: AC

## 2023-07-25 MED ORDER — SULFAMETHOXAZOLE-TRIMETHOPRIM 800-160 MG PO TABS
1.0000 | ORAL_TABLET | Freq: Two times a day (BID) | ORAL | 0 refills | Status: AC
Start: 2023-07-25 — End: 2023-07-28

## 2023-07-25 MED ORDER — LEVOFLOXACIN 500 MG PO TABS: 500.0000 mg | ORAL_TABLET | Freq: Every day | ORAL | 0 refills | Status: AC

## 2023-07-25 NOTE — Addendum Note (Signed)
 Addended by: Marguerita Beards on: 07/25/2023 03:07 PM   Modules accepted: Orders

## 2023-07-27 ENCOUNTER — Ambulatory Visit (INDEPENDENT_AMBULATORY_CARE_PROVIDER_SITE_OTHER): Admitting: Obstetrics and Gynecology

## 2023-07-27 VITALS — BP 155/81 | HR 68

## 2023-07-27 DIAGNOSIS — R948 Abnormal results of function studies of other organs and systems: Secondary | ICD-10-CM

## 2023-07-27 DIAGNOSIS — N993 Prolapse of vaginal vault after hysterectomy: Secondary | ICD-10-CM

## 2023-07-27 DIAGNOSIS — R350 Frequency of micturition: Secondary | ICD-10-CM

## 2023-07-27 DIAGNOSIS — N393 Stress incontinence (female) (male): Secondary | ICD-10-CM

## 2023-07-27 DIAGNOSIS — N3281 Overactive bladder: Secondary | ICD-10-CM

## 2023-07-27 LAB — POCT URINALYSIS DIPSTICK
Bilirubin, UA: NEGATIVE
Glucose, UA: NEGATIVE
Ketones, UA: NEGATIVE
Leukocytes, UA: NEGATIVE
Nitrite, UA: POSITIVE
Protein, UA: NEGATIVE
Spec Grav, UA: 1.02 (ref 1.010–1.025)
Urobilinogen, UA: 0.2 U/dL
pH, UA: 5.5 (ref 5.0–8.0)

## 2023-07-27 NOTE — Progress Notes (Signed)
 [facility] Urodynamics Procedure  Referring Physician: Venita Sheffield, * Date of Procedure: 07/27/2023  Margaret Foley is a 89 y.o. female who presents for urodynamic evaluation. Indication(s) for study: mixed incontinence  Vital Signs: BP (!) 155/81   Pulse 68   Laboratory Results: A catheterized urine specimen revealed:Patient is currently on antibiotics so we proceeded with testing  POC urine:  Lab Results  Component Value Date   COLORU yellow 07/27/2023   CLARITYU clear 07/27/2023   GLUCOSEUR Negative 07/27/2023   BILIRUBINUR negative 07/27/2023   KETONESU negative 07/27/2023   SPECGRAV 1.020 07/27/2023   RBCUR Trace intact 07/27/2023   PHUR 5.5 07/27/2023   PROTEINUR Negative 07/27/2023   UROBILINOGEN 0.2 07/27/2023   LEUKOCYTESUR Negative 07/27/2023      Voiding Diary: Deferred.  Procedure Timeout:  The correct patient was verified and the correct procedure was verified. The patient was in the correct position and safety precautions were reviewed based on at the patient's history.  Urodynamic Procedure A 45F dual lumen urodynamics catheter was placed under sterile conditions into the patient's bladder. A 45F catheter was placed into the rectum in order to measure abdominal pressure. EMG patches were placed in the appropriate position.  All connections were confirmed and calibrations/adjusted made. Saline was instilled into the bladder through the dual lumen catheters.  Cough/valsalva pressures were measured periodically during filling.  Patient was allowed to void.  The bladder was then emptied of its residual.  UROFLOW: Patient was unable to void for initial uroflow. PVR by cath was .   CMG: This was performed with sterile water in the sitting position at a fill rate of 30 mL/min.    First sensation of fullness was 87 mLs,  First urge was 124 mLs,  Strong urge was 177 mLs and  Capacity was 208 mLs  Stress incontinence was  demonstrated Highest positive Barrier CLPP was 76 cmH20 at 134 ml. Highest positive Barrier VLPP was 78 cmH20 at 134 ml.  Detrusor function was overactive, with phasic contractions seen.  The first occurred at 7 mL to 110 cm of water and was associated with urge.  Compliance:  Low Normal. End fill detrusor pressure was 6cmH20.  Calculated compliance was 86mL/cmH20  UPP: MUCP with barrier reduction was 25 cm of water.    MICTURITION STUDY: Voiding was performed with reduction using scopettes in the sitting position.  Pdet at Qmax was 30 cm of water.  Qmax was 10 mL/sec.  It was a prolonged normal pattern.  She voided 203 mL and had a residual of 5 mL.  It was a volitional void, sustained detrusor contraction was present and abdominal straining was not present  EMG: This was performed with patches.  She had voluntary contractions, recruitment with fill was present and urethral sphincter was relaxed with void.  The details of the procedure with the study tracings have been scanned into EPIC.   Urodynamic Impression:  1. Sensation was increased; capacity was reduced 2. Stress Incontinence was demonstrated at normal pressures; 3. Detrusor Overactivity was demonstrated with leakage. 4. Emptying was dysfunctional as she was unable to urinate during initial uroflow but she was able to urinate and empty during micturition study, a sustained detrusor contraction present,  abdominal straining not present, normal urethral sphincter activity on EMG.  Plan: -Information on urethral bulking and surgical sling given to patient's daughter in law.  -confirmed that letter was faxed to PCP  - The patient will follow up  to discuss the findings  and treatment options.

## 2023-08-04 ENCOUNTER — Emergency Department (HOSPITAL_COMMUNITY)
Admission: EM | Admit: 2023-08-04 | Discharge: 2023-08-05 | Discharge status: Against Medical Advice | Attending: Emergency Medicine | Admitting: Emergency Medicine

## 2023-08-04 ENCOUNTER — Other Ambulatory Visit: Payer: Self-pay

## 2023-08-04 DIAGNOSIS — Z5321 Procedure and treatment not carried out due to patient leaving prior to being seen by health care provider: Secondary | ICD-10-CM | POA: Diagnosis not present

## 2023-08-04 DIAGNOSIS — R109 Unspecified abdominal pain: Secondary | ICD-10-CM | POA: Diagnosis present

## 2023-08-04 DIAGNOSIS — K5900 Constipation, unspecified: Secondary | ICD-10-CM | POA: Diagnosis not present

## 2023-08-04 LAB — COMPREHENSIVE METABOLIC PANEL
ALT: 15 U/L (ref 0–44)
AST: 30 U/L (ref 15–41)
Albumin: 3.8 g/dL (ref 3.5–5.0)
Alkaline Phosphatase: 72 U/L (ref 38–126)
Anion gap: 8 (ref 5–15)
BUN: 20 mg/dL (ref 8–23)
CO2: 22 mmol/L (ref 22–32)
Calcium: 9.3 mg/dL (ref 8.9–10.3)
Chloride: 107 mmol/L (ref 98–111)
Creatinine, Ser: 1.61 mg/dL — ABNORMAL HIGH (ref 0.44–1.00)
GFR, Estimated: 31 mL/min — ABNORMAL LOW (ref >60)
Glucose, Bld: 142 mg/dL — ABNORMAL HIGH (ref 70–99)
Potassium: 3.9 mmol/L (ref 3.5–5.1)
Sodium: 137 mmol/L (ref 135–145)
Total Bilirubin: 0.5 mg/dL (ref 0.0–1.2)
Total Protein: 6.7 g/dL (ref 6.5–8.1)

## 2023-08-04 LAB — CBC
HCT: 44.9 % (ref 36.0–46.0)
Hemoglobin: 14.8 g/dL (ref 12.0–15.0)
MCH: 29.7 pg (ref 26.0–34.0)
MCHC: 33 g/dL (ref 30.0–36.0)
MCV: 90 fL (ref 80.0–100.0)
Platelets: 211 10*3/uL (ref 150–400)
RBC: 4.99 MIL/uL (ref 3.87–5.11)
RDW: 14.4 % (ref 11.5–15.5)
WBC: 6.5 10*3/uL (ref 4.0–10.5)
nRBC: 0 % (ref 0.0–0.2)

## 2023-08-04 LAB — LIPASE, BLOOD: Lipase: 44 U/L (ref 11–51)

## 2023-08-04 NOTE — ED Triage Notes (Signed)
 Patient reports constipation for 2 days with mild abdominal pain . No emesis or diarrhea .

## 2023-08-05 NOTE — ED Notes (Signed)
 Patient's family member reports she was able to have a small BM while waiting for a room.  Patient "wants to go home now" per family.  Pt is alert at this time.  Appears in no acute distress.  Encouraged to return if symptoms return

## 2023-09-08 ENCOUNTER — Telehealth: Payer: Self-pay

## 2023-09-08 NOTE — Telephone Encounter (Signed)
 Copied from CRM [id number]. Topic: General - Other >> Sep 08, 2023  2:52 PM Jayson Michael wrote: Reason for CRM: Porsche from [facility] called to follow up on a letter sent on 07/22/2023 requesting documentation of the patient's risk stratification and medical optimization for upcoming surgery. I contacted [facility] and spoke with Mariah Shines. I informed Porsche that Dr. Nathaneil Bakes would likely need to evaluate the patient in person before completing this letter. Porsche understood but emphasized that the patient's surgery is scheduled for 10/03/2023 and the requested documentation is needed prior to that date. Callback number [phone].  Spoke with the patient to see if we can make an appointment to seen in office before having her surgery patient did not understand because her son was at work at the moment and I did let her know that I will talked to her son to see if can make appointment but the patient son was not home at the time and that she will relay the message.  Spoke with Archie Bearded from U[facility] at [facility] to speak with Porsche but she was gone for the day to clarification that the patient will need to be seen in office before doing her surgery.  I did let her know that  Tye Gall, MD does opens during the following before Oct 04, 2023 because Tye Gall, MD will not be in office on May 13 th and 14 th: April 29 th and 30 th May  6th and 7th

## 2023-09-08 NOTE — Telephone Encounter (Signed)
 Spoke to [place] and she indicates that Margaret Foley need an office visit prior to medical clearance. Orion Birks has spoke to the patient and pt is not comprehensive to the communication. Orion Birks is trying to reach one of her sons to schedule as they have a limited appointments.

## 2023-09-09 ENCOUNTER — Ambulatory Visit: Admitting: Obstetrics and Gynecology

## 2023-09-09 ENCOUNTER — Telehealth: Payer: Self-pay

## 2023-09-09 ENCOUNTER — Encounter: Payer: Self-pay | Admitting: Obstetrics and Gynecology

## 2023-09-09 VITALS — BP 139/83 | HR 83

## 2023-09-09 DIAGNOSIS — N993 Prolapse of vaginal vault after hysterectomy: Secondary | ICD-10-CM

## 2023-09-09 DIAGNOSIS — N393 Stress incontinence (female) (male): Secondary | ICD-10-CM

## 2023-09-09 DIAGNOSIS — Z01818 Encounter for other preprocedural examination: Secondary | ICD-10-CM | POA: Diagnosis not present

## 2023-09-09 MED ORDER — ACETAMINOPHEN 500 MG PO TABS: 500.0000 mg | ORAL_TABLET | Freq: Four times a day (QID) | ORAL | 0 refills | Status: AC | PRN

## 2023-09-09 MED ORDER — POLYETHYLENE GLYCOL 3350 17 GM/SCOOP PO POWD: 17.0000 g | Freq: Every day | ORAL | 0 refills | Status: DC

## 2023-09-09 MED ORDER — OXYCODONE HCL 5 MG PO TABS: 5.0000 mg | ORAL_TABLET | ORAL | 0 refills | Status: DC | PRN

## 2023-09-09 MED ORDER — ESTRADIOL 0.1 MG/GM VA CREA: TOPICAL_CREAM | VAGINAL | 2 refills | Status: DC

## 2023-09-09 NOTE — H&P (Signed)
 [facility] Pre Op H&P  Subjective Chief Complaint: Margaret Foley presents for a return visit.   History of Present Illness: Margaret Foley is a 89 y.o. female who presenting for a pre op visit.  She is scheduled to undergo Exam under anesthesia, Colpocleisis with levator plication and perineorrhaphy, midurethral sling, cystoscopy on 10/03/23.  Her symptoms include vaginal bulge, and she was was found to have Stage IV anterior, Stage IV posterior, Stage IV apical prolapse.   Urodynamic Impression:  1. Sensation was increased; capacity was reduced 2. Stress Incontinence was demonstrated at normal pressures; 3. Detrusor Overactivity was demonstrated with leakage. 4. Emptying was dysfunctional as she was unable to urinate during initial uroflow but she was able to urinate and empty during micturition study, a sustained detrusor contraction present,  abdominal straining not present, normal urethral sphincter activity on EMG.  Past Medical History:  Diagnosis Date   Anxiety    Atrial fibrillation (HCC)    Barrett's esophagus    CAD (coronary artery disease)    Cancer (HCC)    Pre cancerous Uterine Tumor   Carotid artery occlusion    Chest pain    atypical normal myovue 2008   COPD (chronic obstructive pulmonary disease) (HCC)    Cough    Depression    Fibromyalgia    GERD (gastroesophageal reflux disease)    Heart murmur    av sclerosis and mild ar echo 2008   Hip pain    History of hiatal hernia    Hyperlipidemia    Hypertension    Insomnia, unspecified    Kidney stone    Lumbago    Myalgia and myositis, unspecified    statin intolerant   Occlusion and stenosis of vertebral artery without mention of cerebral infarction    Osteoarthritis    Osteopenia    Other B-complex deficiencies    Peripheral artery disease (HCC) 12/02/2016   Peripheral vascular disease (HCC)    Shortness of breath dyspnea    Tobacco use disorder    Unspecified vitamin D  deficiency       Past Surgical History:  Procedure Laterality Date   ABDOMINAL AORTIC ANEURYSM REPAIR N/A 05/02/2015   Procedure: REPAIR ABDOMINAL AORTIC ANEURYSM  ,AORTA BI-ILIAC;  Surgeon: Margherita Shell, MD;  Location: [facility] OR;  Service: Vascular;  Laterality: N/A;   ABDOMINAL HYSTERECTOMY  1986   Complete   cataract surgery  2011   COLONOSCOPY     JOINT REPLACEMENT  2012   Right Shoulder and arm    is allergic to penicillins, aleve [naproxen], benazepril hcl, carvedilol, indomethacin, pravastatin sodium, rosuvastatin , simvastatin, tramadol, and advil [ibuprofen].   Family History  Problem Relation Age of Onset   Arthritis Mother    Heart disease Mother    Hypertension Mother    Heart attack Mother    Hyperlipidemia Other    Heart disease Son    Hypertension Son    Cancer Father    Hyperlipidemia Sister    Cancer Brother    Colon cancer Neg Hx     Social History   Tobacco Use   Smoking status: Some Days    Current packs/day: 0.25    Average packs/day: 0.3 packs/day for 57.0 years (14.3 ttl pk-yrs)    Types: Cigarettes   Smokeless tobacco: Never  Vaping Use   Vaping status: Never Used  Substance Use Topics   Alcohol use: No    Alcohol/week: 0.0 standard drinks of alcohol   Drug use: No  Review of Systems was negative for a full 10 system review except as noted in the History of Present Illness.  No current facility-administered medications for this encounter.  Current Outpatient Medications:    acetaminophen  (TYLENOL ) 500 MG tablet, Take 1 tablet (500 mg total) by mouth every 6 (six) hours as needed (pain)., Disp: 30 tablet, Rfl: 0   aspirin  EC 81 MG tablet, Take 81 mg by mouth daily. Swallow whole., Disp: , Rfl:    [START ON 09/12/2023] estradiol (ESTRACE) 0.1 MG/GM vaginal cream, Place 0.5g at the vaginal opening two times a week at night., Disp: 42.5 g, Rfl: 2   nystatin  (MYCOSTATIN /NYSTOP ) powder, Apply 1 Application topically 2 (two) times daily. Apply to goin or skin  folds (Patient not taking: Reported on 09/09/2023), Disp: 60 g, Rfl: 11   oxyCODONE  (OXY IR/ROXICODONE ) 5 MG immediate release tablet, Take 1 tablet (5 mg total) by mouth every 4 (four) hours as needed for severe pain (pain score 7-10)., Disp: 5 tablet, Rfl: 0   polyethylene glycol powder (GLYCOLAX/MIRALAX) 17 GM/SCOOP powder, Take 17 g by mouth daily. Drink 17g (1 scoop) dissolved in water per day., Disp: 255 g, Rfl: 0   Objective There were no vitals filed for this visit.  Gen: NAD CV: S1 S2 RRR Lungs: Clear to auscultation bilaterally Abd: soft, nontender   Previous Pelvic Exam showed: POP-Q   3                                            Aa   6                                           Ba   6                                              C    6.5                                            Gh   2.5                                            Pb   6                                            tvl    3                                            Ap   6  Bp                                                  D       Assessment/ Plan  The patient is a 89 y.o. year old with stage IV POP and SUI scheduled to undergo: Exam under anesthesia, Colpocleisis with levator plication and perineorrhaphy, midurethral sling, cystoscopy.     Arma Lamp, MD

## 2023-09-09 NOTE — Progress Notes (Signed)
 [facility]  Subjective Chief Complaint: Margaret Foley presents for a return visit.   History of Present Illness: Margaret Foley is a 89 y.o. female who presenting for a return visit after urodynamic testing.  She is scheduled to undergo Exam under anesthesia, Colpocleisis with levator plication and perineorrhaphy, midurethral sling, cystoscopy on 10/03/23.  Her symptoms include vaginal bulge, and she was was found to have Stage IV anterior, Stage IV posterior, Stage IV apical prolapse.   Urodynamic Impression:  1. Sensation was increased; capacity was reduced 2. Stress Incontinence was demonstrated at normal pressures; 3. Detrusor Overactivity was demonstrated with leakage. 4. Emptying was dysfunctional as she was unable to urinate during initial uroflow but she was able to urinate and empty during micturition study, a sustained detrusor contraction present,  abdominal straining not present, normal urethral sphincter activity on EMG.  Past Medical History:  Diagnosis Date   Anxiety    Atrial fibrillation (HCC)    Barrett's esophagus    CAD (coronary artery disease)    Cancer (HCC)    Pre cancerous Uterine Tumor   Carotid artery occlusion    Chest pain    atypical normal myovue 2008   COPD (chronic obstructive pulmonary disease) (HCC)    Cough    Depression    Fibromyalgia    GERD (gastroesophageal reflux disease)    Heart murmur    av sclerosis and mild ar echo 2008   Hip pain    History of hiatal hernia    Hyperlipidemia    Hypertension    Insomnia, unspecified    Kidney stone    Lumbago    Myalgia and myositis, unspecified    statin intolerant   Occlusion and stenosis of vertebral artery without mention of cerebral infarction    Osteoarthritis    Osteopenia    Other B-complex deficiencies    Peripheral artery disease (HCC) 12/02/2016   Peripheral vascular disease (HCC)    Shortness of breath dyspnea    Tobacco use disorder    Unspecified vitamin D   deficiency      Past Surgical History:  Procedure Laterality Date   ABDOMINAL AORTIC ANEURYSM REPAIR N/A 05/02/2015   Procedure: REPAIR ABDOMINAL AORTIC ANEURYSM  ,AORTA BI-ILIAC;  Surgeon: Margherita Shell, MD;  Location: [facility] OR;  Service: Vascular;  Laterality: N/A;   ABDOMINAL HYSTERECTOMY  1986   Complete   cataract surgery  2011   COLONOSCOPY     JOINT REPLACEMENT  2012   Right Shoulder and arm    is allergic to penicillins, aleve [naproxen], benazepril hcl, carvedilol, indomethacin, pravastatin sodium, rosuvastatin , simvastatin, tramadol, and advil [ibuprofen].   Family History  Problem Relation Age of Onset   Arthritis Mother    Heart disease Mother    Hypertension Mother    Heart attack Mother    Hyperlipidemia Other    Heart disease Son    Hypertension Son    Cancer Father    Hyperlipidemia Sister    Cancer Brother    Colon cancer Neg Hx     Social History   Tobacco Use   Smoking status: Some Days    Current packs/day: 0.25    Average packs/day: 0.3 packs/day for 57.0 years (14.3 ttl pk-yrs)    Types: Cigarettes   Smokeless tobacco: Never  Vaping Use   Vaping status: Never Used  Substance Use Topics   Alcohol use: No    Alcohol/week: 0.0 standard drinks of alcohol   Drug use: No  Review of Systems was negative for a full 10 system review except as noted in the History of Present Illness.   Current Outpatient Medications:    nystatin  (MYCOSTATIN /NYSTOP ) powder, Apply 1 Application topically 2 (two) times daily. Apply to goin or skin folds, Disp: 60 g, Rfl: 11   Objective There were no vitals filed for this visit.  Gen: NAD CV: S1 S2 RRR Lungs: Clear to auscultation bilaterally Abd: soft, nontender   Previous Pelvic Exam showed: POP-Q   3                                            Aa   6                                           Ba   6                                              C    6.5                                            Gh    2.5                                            Pb   6                                            tvl    3                                            Ap   6                                            Bp                                                  D       Assessment/ Plan  Assessment: The patient is a 89 y.o. year old scheduled to undergo Exam under anesthesia, Colpocleisis with levator plication and perineorrhaphy, midurethral sling, cystoscopy.   Plan: General Surgical Consent: The patient has previously been counseled on alternative treatments, and the decision by the patient and provider was to proceed with the procedure listed above.  For all procedures, there are risks of bleeding, infection, damage to surrounding organs including but not limited to bowel, bladder, blood vessels,  ureters and nerves, and need for further surgery if an injury were to occur. These risks are all low with minimally invasive surgery.   There are risks of numbness and weakness at any body site or buttock/rectal pain.  It is possible that baseline pain can be worsened by surgery, either with or without mesh. If surgery is vaginal, there is also a low risk of possible conversion to laparoscopy or open abdominal incision where indicated. Very rare risks include blood transfusion, blood clot, heart attack, pneumonia, or death.   There is also a risk of short-term postoperative urinary retention with need to use a catheter. About half of patients need to go home from surgery with a catheter, which is then later removed in the office. The risk of long-term need for a catheter is very low. There is also a risk of worsening of overactive bladder.   Sling: The effectiveness of a midurethral vaginal mesh sling is approximately 85%, and thus, there will be times when you may leak urine after surgery, especially if your bladder is full or if you have a strong cough. There is a balance between making the sling  tight enough to treat your leakage but not too tight so that you have long-term difficulty emptying your bladder. A mesh sling will not directly treat overactive bladder/urge incontinence and may worsen it.  There is an FDA safety notification on vaginal mesh procedures for prolapse but NOT mesh slings. We have extensive experience and training with mesh placement and we have close postoperative follow up to identify any potential complications from mesh. It is important to realize that this mesh is a permanent implant that cannot be easily removed. There are rare risks of mesh exposure (2-4%), pain with intercourse (0-7%), and infection (<1%). The risk of mesh exposure if more likely in a woman with risks for poor healing (prior radiation, poorly controlled diabetes, or immunocompromised). The risk of new or worsened chronic pain after mesh implant is more common in women with baseline chronic pain and/or poorly controlled anxiety or depression. Approximately 2-4% of patients will experience longer-term post-operative voiding dysfunction that may require surgical revision of the sling. We also reviewed that postoperatively, her stream may not be as strong as before surgery.    Prolapse (with or without mesh): Risk factors for surgical failure  include things that put pressure on your pelvis and the surgical repair, including obesity, chronic cough, and heavy lifting or straining (including lifting children or adults, straining on the toilet, or lifting heavy objects such as furniture or anything weighing >25 lbs. Risks of recurrence is 20-30% with vaginal native tissue repair and a less than 10% with sacrocolpopexy with mesh. Risk of recurrence with colpocleisis is 5-10%.    We discussed consent for blood products. Risks for blood transfusion include allergic reactions, other reactions that can affect different body organs and managed accordingly, transmission of infectious diseases such as HIV or Hepatitis.  However, the blood is screened. Patient consents for blood products.  Pre-operative instructions:  She was instructed to not take Aspirin /NSAIDs x 7days prior to surgery. She may continue her 81mg  ASA. Antibiotic prophylaxis was ordered as indicated.  Catheter use: Patient will go home with foley if needed after post-operative voiding trial.  Post-operative instructions:  She was provided with specific post-operative instructions, including precautions and signs/symptoms for which we would recommend contacting us , in addition to daytime and after-hours contact phone numbers. This was provided on a handout.   Post-operative medications: Prescriptions  for motrin, tylenol , miralax, and oxycodone  were sent to her pharmacy. Discussed using ibuprofen and tylenol  on a schedule to limit use of narcotics.   Laboratory testing:  We will check labs: BMP, type and screen  Preoperative clearance:  She does require surgical clearance- awaiting clearance from PCP.    Post-operative follow-up:  A post-operative appointment will be made for 6 weeks from the date of surgery. If she needs a post-operative nurse visit for a voiding trial, that will be set up after she leaves the hospital.    Patient will call the clinic or use MyChart should anything change or any new issues arise.   Arma Lamp, MD

## 2023-09-09 NOTE — Telephone Encounter (Signed)
 Copied from CRM [id number]. Topic: Clinical - Medical Advice >> Sep 09, 2023  4:55 PM Corin V wrote: Reason for CRM: Patient's daughter in law, [place], would like to make sure that during the pre-op appointment on 4/30 there is a conversation regarding is anesthesia will be safe for patient due to her memory issues. Boyd Cabal stated that since patient does not have an official diagnosis for any memory issues, she is unsure if insurance would cover any inpatient time or if that would even be necessary. She is concerned about patient health and safety if she is still feeling any affects of anesthesia after she is released as it is currently supposed to be an outpatient procedure. Boyd Cabal is unsure if patient will be combative or irritable if this is discussed in front of her as it will depend on her mood that day, so wanted to make sure that any conversation could still happen privately if needed. She is also requesting any resources as they start to learn about memory issues and needs for patient.  Message sent to Tye Gall, MD

## 2023-09-12 NOTE — Telephone Encounter (Signed)
 I called patient daughter back and phone line kept ringing busy. I will make another attempt to contact her.    Tye Gall, MD  [facility]2 days ago    Plz call the daughter and inform that she needs to check with anesthesia/ surgeon about the type of anesthesia they are going to use and its effect on memory.

## 2023-09-14 ENCOUNTER — Ambulatory Visit (INDEPENDENT_AMBULATORY_CARE_PROVIDER_SITE_OTHER): Admitting: Sports Medicine

## 2023-09-14 ENCOUNTER — Encounter: Payer: Self-pay | Admitting: Sports Medicine

## 2023-09-14 VITALS — BP 136/80 | HR 80 | Temp 97.6°F | Resp 18 | Ht 62.0 in | Wt 168.8 lb

## 2023-09-14 DIAGNOSIS — R413 Other amnesia: Secondary | ICD-10-CM

## 2023-09-14 DIAGNOSIS — Z01818 Encounter for other preprocedural examination: Secondary | ICD-10-CM

## 2023-09-14 DIAGNOSIS — N8110 Cystocele, unspecified: Secondary | ICD-10-CM | POA: Diagnosis not present

## 2023-09-14 DIAGNOSIS — N1831 Chronic kidney disease, stage 3a: Secondary | ICD-10-CM | POA: Diagnosis not present

## 2023-09-14 NOTE — Progress Notes (Signed)
 Careteam: Patient Care Team: Tye Gall, MD as PCP - General (Internal Medicine) Shirlee Dotter, MD (Inactive) (Orthopedic Surgery) Loyde Rule, MD (Cardiology) Jonathan Neighbor, [facility] (Inactive) as Pharmacist (Pharmacist)  PLACE OF SERVICE:  [facility]  Advanced Directive information    Allergies  Allergen Reactions   Penicillins Rash    Has patient had a PCN reaction causing immediate rash, facial/tongue/throat swelling, SOB or lightheadedness with hypotension: Yes Has patient had a PCN reaction causing severe rash involving mucus membranes or skin necrosis: No Has patient had a PCN reaction that required hospitalization No Has patient had a PCN reaction occurring within the last 10 years: No If all of the above answers are "NO", then may proceed with Cephalosporin use.    Aleve [Naproxen] Other (See Comments)    Caused scar tissue    Benazepril Hcl     REACTION: cough   Carvedilol     REACTION: ear buzzing ??   Indomethacin    Pravastatin Sodium     REACTION: achy   Rosuvastatin      REACTION: buzzing   Simvastatin     REACTION: myalgia   Tramadol     Ears ringing?   Advil [Ibuprofen] Rash    Chief Complaint  Patient presents with   Medical Clearance    Pre-surgery clearance     Discussed the use of AI scribe software for clinical note transcription with the patient, who gave verbal consent to proceed.  History of Present Illness  Margaret Foley is an 89 year old female with dementia who presents for preoperative evaluation   She is scheduled for a vaginal closure procedure on May 19th.  She has no history of heart problems, recent heart attacks, or hospitalizations in the last three to four years.  She is able to walk around her house and yard without difficulty and can walk for 10 to 20 minutes without stopping. No chest pain or shortness of breath during these activities.   No recent respiratory symptoms such as runny nose, cough,  or congestion. She is able to perform her housework, including showering and changing clothes independently. She also does light household chores and yard work, such as Electronics engineer leaves, without experiencing chest pain or shortness of breath.  Her son assists with her medication management, ensuring she takes her daily pill. She lives with her son, who helps with cooking and other household tasks as needed.  In terms of her cognitive status, she experiences forgetfulness but no recent confusion, hallucinations, or agitation. She is unable to recall the current date, month, or year during the conversation.  Exam under anesthesia, Colpocleisis with levator plication and perineorrhaphy, midurethral sling, cystoscopy.        Review of Systems:  Review of Systems  Constitutional:  Negative for chills and fever.  HENT:  Negative for congestion and sore throat.   Eyes:  Negative for double vision.  Respiratory:  Negative for cough, sputum production and shortness of breath.   Cardiovascular:  Negative for chest pain, palpitations and leg swelling.  Gastrointestinal:  Negative for abdominal pain, heartburn and nausea.  Genitourinary:  Negative for dysuria, frequency and hematuria.  Musculoskeletal:  Negative for falls and myalgias.  Neurological:  Negative for dizziness.  Psychiatric/Behavioral:  Positive for memory loss.    Negative unless indicated in HPI.   Past Medical History:  Diagnosis Date   Anxiety    Atrial fibrillation (HCC)    Barrett's esophagus  CAD (coronary artery disease)    Cancer (HCC)    Pre cancerous Uterine Tumor   Carotid artery occlusion    Chest pain    atypical normal myovue 2008   COPD (chronic obstructive pulmonary disease) (HCC)    Cough    Depression    Fibromyalgia    GERD (gastroesophageal reflux disease)    Heart murmur    av sclerosis and mild ar echo 2008   Hip pain    History of hiatal hernia    Hyperlipidemia    Hypertension     Insomnia, unspecified    Kidney stone    Lumbago    Myalgia and myositis, unspecified    statin intolerant   Occlusion and stenosis of vertebral artery without mention of cerebral infarction    Osteoarthritis    Osteopenia    Other B-complex deficiencies    Peripheral artery disease (HCC) 12/02/2016   Peripheral vascular disease (HCC)    Shortness of breath dyspnea    Tobacco use disorder    Unspecified vitamin D  deficiency    Past Surgical History:  Procedure Laterality Date   ABDOMINAL AORTIC ANEURYSM REPAIR N/A 05/02/2015   Procedure: REPAIR ABDOMINAL AORTIC ANEURYSM  ,AORTA BI-ILIAC;  Surgeon: Margherita Shell, MD;  Location: [facility] OR;  Service: Vascular;  Laterality: N/A;   ABDOMINAL HYSTERECTOMY  1986   Complete   cataract surgery  2011   COLONOSCOPY     JOINT REPLACEMENT  2012   Right Shoulder and arm   Social History:   reports that she has been smoking cigarettes. She has a 14.3 pack-year smoking history. She has never used smokeless tobacco. She reports that she does not drink alcohol and does not use drugs.  Family History  Problem Relation Age of Onset   Arthritis Mother    Heart disease Mother    Hypertension Mother    Heart attack Mother    Hyperlipidemia Other    Heart disease Son    Hypertension Son    Cancer Father    Hyperlipidemia Sister    Cancer Brother    Colon cancer Neg Hx     Medications: Patient's Medications  New Prescriptions   No medications on file  Previous Medications   ACETAMINOPHEN  (TYLENOL ) 500 MG TABLET    Take 1 tablet (500 mg total) by mouth every 6 (six) hours as needed (pain).   ASPIRIN  EC 81 MG TABLET    Take 81 mg by mouth daily. Swallow whole.   ESTRADIOL (ESTRACE) 0.1 MG/GM VAGINAL CREAM    Place 0.5g at the vaginal opening two times a week at night.   NYSTATIN  (MYCOSTATIN /NYSTOP ) POWDER    Apply 1 Application topically 2 (two) times daily. Apply to goin or skin folds   OXYCODONE  (OXY IR/ROXICODONE ) 5 MG IMMEDIATE RELEASE  TABLET    Take 1 tablet (5 mg total) by mouth every 4 (four) hours as needed for severe pain (pain score 7-10).   POLYETHYLENE GLYCOL POWDER (GLYCOLAX/MIRALAX) 17 GM/SCOOP POWDER    Take 17 g by mouth daily. Drink 17g (1 scoop) dissolved in water per day.  Modified Medications   No medications on file  Discontinued Medications   No medications on file    Physical Exam: There were no vitals filed for this visit. There is no height or weight on file to calculate BMI. BP Readings from Last 3 Encounters:  09/09/23 139/83  08/04/23 (!) 175/79  07/27/23 (!) 155/81   Wt Readings from Last 3 Encounters:  07/22/23 170 lb (77.1 kg)  06/28/23 173 lb 6.4 oz (78.7 kg)  10/04/22 180 lb (81.6 kg)    Physical Exam Constitutional:      Appearance: Normal appearance.  HENT:     Head: Normocephalic and atraumatic.  Cardiovascular:     Rate and Rhythm: Normal rate and regular rhythm.  Pulmonary:     Effort: Pulmonary effort is normal. No respiratory distress.     Breath sounds: Normal breath sounds. No wheezing.  Abdominal:     General: Bowel sounds are normal. There is no distension.     Tenderness: There is no abdominal tenderness. There is no guarding.     Comments:    Musculoskeletal:        General: No swelling.  Neurological:     Mental Status: She is alert. Mental status is at baseline.     Motor: No weakness.     Labs reviewed: Basic Metabolic Panel: Recent Labs    10/04/22 1056 06/28/23 1507 08/04/23 2215  NA 141 143 137  K 4.6 4.4 3.9  CL 105 106 107  CO2 28 28 22   GLUCOSE 103* 92 142*  BUN 27* 21 20  CREATININE 1.20 1.16* 1.61*  CALCIUM  10.3 9.8 9.3  TSH  --  1.67  --    Liver Function Tests: Recent Labs    10/04/22 1056 08/04/23 2215  AST 34 30  ALT 11 15  ALKPHOS 78 72  BILITOT 0.5 0.5  PROT 7.5 6.7  ALBUMIN  4.1 3.8   Recent Labs    08/04/23 2215  LIPASE 44   No results for input(s): "AMMONIA" in the last 8760 hours. CBC: Recent Labs     10/04/22 1056 06/28/23 1507 08/04/23 2215  WBC 6.2 6.6 6.5  NEUTROABS 3.8  --   --   HGB 14.8 14.7 14.8  HCT 43.6 44.3 44.9  MCV 87.8 89.0 90.0  PLT 206.0 217 211   Lipid Panel: Recent Labs    10/04/22 1056  CHOL 215*  HDL 52.30  LDLCALC 144*  TRIG 95.0  CHOLHDL 4   TSH: Recent Labs    06/28/23 1507  TSH 1.67   A1C: Lab Results  Component Value Date   HGBA1C 5.7 10/04/2022    Assessment and Plan Assessment & Plan 1. Pre-operative clearance (Primary) 2. Vaginal prolapse Pt scheduled for Colpocleisis with levator plication and perineorrhaphy, midurethral sling, cystoscopy under general anesthesia - EKG 12-Lead - old LBBB No new changes METS 4  RSRI RCRI - 0 risk factors 3.9%  Pt is intermediate risk going for low risk procedure Informed patient about the post op delirium     3. Memory deficits No new changes Increase cognitively engaging activities  4. Stage 3a chronic kidney disease (HCC) Lab Results  Component Value Date   CREATININE 1.61 (H) 08/04/2023   CREATININE 1.16 (H) 06/28/2023   CREATININE 1.20 10/04/2022   Avoid nephrotoxic meds   RCRI - 0 risk factors 3.9%    40 min Total time spent for obtaining history,  performing a medically appropriate examination and evaluation, reviewing the tests,ordering  tests,  documenting clinical information in the electronic or other health record,  care coordination (not separately reported)

## 2023-09-14 NOTE — Telephone Encounter (Signed)
 Patient has appointment today 09/14/2023 at 2pm with PCP Tye Gall, MD . This can be discussed during visit since phone call wasn't answered.

## 2023-09-26 ENCOUNTER — Encounter (HOSPITAL_COMMUNITY): Payer: Self-pay | Admitting: Obstetrics and Gynecology

## 2023-09-26 NOTE — Progress Notes (Addendum)
 Addendum:  Margaret Cabal (patients daughter in law) stated patient takes medication for hypertension, does not know name or dosage.  Daughter in law to call back and let me know name and dosage of medication and so I can let her know if patient should or should not take medication AM of procedure.     Spoke w/ via phone for pre-op interview--- Margaret Foley and daughter in law Margaret Foley needs dos----  T&S and BMP per Careers adviser.       Foley results------ COVID test -----patient states asymptomatic no test needed Arrive at -------0900 NPO after MN NO Solid Food.  Clear liquids from MN until---0800 Pre-Surgery Ensure or G2:  Med rec completed Medications to take morning of surgery ----- Diabetic medication -----  GLP1 agonist last dose: GLP1 instructions:  PCP clearance in Epic dated 09/14/23 by Dr Nathaneil Bakes.  Patient instructed no nail polish to be worn day of surgery Patient instructed to bring photo id and insurance card day of surgery Patient aware to have Driver (ride ) / caregiver    for 24 hours after surgery - Margaret Foley daughter in Social worker and son Margaret Foley Patient Special Instructions ----- Shower with antibacterial soap. Pre-Op special Instructions ----- pt has hx of dementia, is confused sometimes more than others, son or daughter in law needs to come back for preop.  Per Dr Frutoso Jing instructions pt may continue ASA. ASA on pt list but,  per daughter in law pt does not take.   Patient verbalized understanding of instructions that were given at this phone interview. Patient denies chest pain, sob, fever, cough at the interview.

## 2023-09-27 ENCOUNTER — Ambulatory Visit: Payer: BLUE CROSS/BLUE SHIELD | Admitting: Sports Medicine

## 2023-10-03 ENCOUNTER — Ambulatory Visit (HOSPITAL_BASED_OUTPATIENT_CLINIC_OR_DEPARTMENT_OTHER): Admitting: Anesthesiology

## 2023-10-03 ENCOUNTER — Telehealth: Payer: Self-pay | Admitting: Obstetrics and Gynecology

## 2023-10-03 ENCOUNTER — Encounter (HOSPITAL_COMMUNITY)
Admission: RE | Disposition: A | Discharge status: Home / Self Care | Payer: Self-pay | Source: Home / Self Care | Attending: Obstetrics and Gynecology

## 2023-10-03 ENCOUNTER — Encounter: Payer: Self-pay | Admitting: Obstetrics and Gynecology

## 2023-10-03 ENCOUNTER — Ambulatory Visit (HOSPITAL_COMMUNITY): Admitting: Anesthesiology

## 2023-10-03 ENCOUNTER — Ambulatory Visit (HOSPITAL_COMMUNITY)
Admission: RE | Admit: 2023-10-03 | Discharge: 2023-10-03 | Disposition: A | Discharge status: Home / Self Care | Attending: Obstetrics and Gynecology | Admitting: Obstetrics and Gynecology

## 2023-10-03 ENCOUNTER — Other Ambulatory Visit: Payer: Self-pay

## 2023-10-03 ENCOUNTER — Encounter (HOSPITAL_COMMUNITY): Payer: Self-pay | Admitting: Obstetrics and Gynecology

## 2023-10-03 DIAGNOSIS — M797 Fibromyalgia: Secondary | ICD-10-CM | POA: Insufficient documentation

## 2023-10-03 DIAGNOSIS — F32A Depression, unspecified: Secondary | ICD-10-CM | POA: Diagnosis not present

## 2023-10-03 DIAGNOSIS — I739 Peripheral vascular disease, unspecified: Secondary | ICD-10-CM | POA: Insufficient documentation

## 2023-10-03 DIAGNOSIS — I10 Essential (primary) hypertension: Secondary | ICD-10-CM | POA: Insufficient documentation

## 2023-10-03 DIAGNOSIS — F17210 Nicotine dependence, cigarettes, uncomplicated: Secondary | ICD-10-CM

## 2023-10-03 DIAGNOSIS — F419 Anxiety disorder, unspecified: Secondary | ICD-10-CM | POA: Diagnosis not present

## 2023-10-03 DIAGNOSIS — N393 Stress incontinence (female) (male): Secondary | ICD-10-CM

## 2023-10-03 DIAGNOSIS — I129 Hypertensive chronic kidney disease with stage 1 through stage 4 chronic kidney disease, or unspecified chronic kidney disease: Secondary | ICD-10-CM | POA: Diagnosis not present

## 2023-10-03 DIAGNOSIS — I2510 Atherosclerotic heart disease of native coronary artery without angina pectoris: Secondary | ICD-10-CM

## 2023-10-03 DIAGNOSIS — J449 Chronic obstructive pulmonary disease, unspecified: Secondary | ICD-10-CM | POA: Insufficient documentation

## 2023-10-03 DIAGNOSIS — K219 Gastro-esophageal reflux disease without esophagitis: Secondary | ICD-10-CM | POA: Insufficient documentation

## 2023-10-03 DIAGNOSIS — N189 Chronic kidney disease, unspecified: Secondary | ICD-10-CM | POA: Diagnosis not present

## 2023-10-03 DIAGNOSIS — M1990 Unspecified osteoarthritis, unspecified site: Secondary | ICD-10-CM | POA: Diagnosis not present

## 2023-10-03 DIAGNOSIS — K449 Diaphragmatic hernia without obstruction or gangrene: Secondary | ICD-10-CM | POA: Insufficient documentation

## 2023-10-03 DIAGNOSIS — N993 Prolapse of vaginal vault after hysterectomy: Secondary | ICD-10-CM

## 2023-10-03 HISTORY — PX: RECTOCELE REPAIR: SHX761

## 2023-10-03 HISTORY — PX: BLADDER SUSPENSION: SHX72

## 2023-10-03 HISTORY — PX: COLPOCLEISIS: SHX6814

## 2023-10-03 HISTORY — PX: CYSTOSCOPY: SHX5120

## 2023-10-03 LAB — BASIC METABOLIC PANEL WITH GFR
Anion gap: 8 (ref 5–15)
BUN: 16 mg/dL (ref 8–23)
CO2: 24 mmol/L (ref 22–32)
Calcium: 9.1 mg/dL (ref 8.9–10.3)
Chloride: 109 mmol/L (ref 98–111)
Creatinine, Ser: 1.08 mg/dL — ABNORMAL HIGH (ref 0.44–1.00)
GFR, Estimated: 50 mL/min — ABNORMAL LOW (ref >60)
Glucose, Bld: 102 mg/dL — ABNORMAL HIGH (ref 70–99)
Potassium: 4.2 mmol/L (ref 3.5–5.1)
Sodium: 141 mmol/L (ref 135–145)

## 2023-10-03 LAB — TYPE AND SCREEN
ABO/RH(D): AB POS
Antibody Screen: NEGATIVE

## 2023-10-03 SURGERY — COLPOCLEISIS
Anesthesia: General

## 2023-10-03 MED ORDER — SUGAMMADEX SODIUM 200 MG/2ML IV SOLN
INTRAVENOUS | Status: DC | PRN
  Administered 2023-10-03: 200 mg via INTRAVENOUS

## 2023-10-03 MED ORDER — ONDANSETRON HCL 4 MG/2ML IJ SOLN
INTRAMUSCULAR | Status: DC | PRN
  Administered 2023-10-03: 4 mg via INTRAVENOUS

## 2023-10-03 MED ORDER — ORAL CARE MOUTH RINSE: 15.0000 mL | Freq: Once | OROMUCOSAL | Status: AC

## 2023-10-03 MED ORDER — PHENAZOPYRIDINE HCL 100 MG PO TABS
ORAL_TABLET | ORAL | Status: AC
  Filled 2023-10-03: qty 2

## 2023-10-03 MED ORDER — SODIUM CHLORIDE (PF) 0.9 % IJ SOLN
INTRAMUSCULAR | Status: DC | PRN
  Administered 2023-10-03: 56 mL via SURGICAL_CAVITY

## 2023-10-03 MED ORDER — FENTANYL CITRATE (PF) 250 MCG/5ML IJ SOLN
INTRAMUSCULAR | Status: DC | PRN
  Administered 2023-10-03: 100 ug via INTRAVENOUS

## 2023-10-03 MED ORDER — FENTANYL CITRATE (PF) 250 MCG/5ML IJ SOLN
INTRAMUSCULAR | Status: AC
  Filled 2023-10-03: qty 5

## 2023-10-03 MED ORDER — LIDOCAINE 2% (20 MG/ML) 5 ML SYRINGE
INTRAMUSCULAR | Status: DC | PRN
  Administered 2023-10-03: 80 mg via INTRAVENOUS

## 2023-10-03 MED ORDER — FENTANYL CITRATE (PF) 100 MCG/2ML IJ SOLN: 25.0000 ug | INTRAMUSCULAR | Status: DC | PRN

## 2023-10-03 MED ORDER — ACETAMINOPHEN 500 MG PO TABS
1000.0000 mg | ORAL_TABLET | ORAL | Status: AC
  Administered 2023-10-03: 1000 mg via ORAL

## 2023-10-03 MED ORDER — PHENYLEPHRINE HCL-NACL 20-0.9 MG/250ML-% IV SOLN
INTRAVENOUS | Status: DC | PRN
  Administered 2023-10-03: 30 ug/min via INTRAVENOUS

## 2023-10-03 MED ORDER — SODIUM CHLORIDE 0.9 % IR SOLN
Status: DC | PRN
  Administered 2023-10-03: 1000 mL via INTRAVESICAL

## 2023-10-03 MED ORDER — ACETAMINOPHEN 500 MG PO TABS
ORAL_TABLET | ORAL | Status: AC
  Filled 2023-10-03: qty 2

## 2023-10-03 MED ORDER — ENOXAPARIN SODIUM 40 MG/0.4ML IJ SOSY
PREFILLED_SYRINGE | INTRAMUSCULAR | Status: AC
  Filled 2023-10-03: qty 0.4

## 2023-10-03 MED ORDER — CLINDAMYCIN PHOSPHATE 900 MG/50ML IV SOLN
900.0000 mg | INTRAVENOUS | Status: AC
  Administered 2023-10-03: 900 mg via INTRAVENOUS

## 2023-10-03 MED ORDER — PROPOFOL 10 MG/ML IV BOLUS
INTRAVENOUS | Status: DC | PRN
  Administered 2023-10-03: 100 mg via INTRAVENOUS

## 2023-10-03 MED ORDER — EPHEDRINE SULFATE-NACL 50-0.9 MG/10ML-% IV SOSY
PREFILLED_SYRINGE | INTRAVENOUS | Status: DC | PRN
  Administered 2023-10-03: 5 mg via INTRAVENOUS

## 2023-10-03 MED ORDER — PHENAZOPYRIDINE HCL 100 MG PO TABS
200.0000 mg | ORAL_TABLET | ORAL | Status: AC
  Administered 2023-10-03: 200 mg via ORAL

## 2023-10-03 MED ORDER — DEXAMETHASONE SODIUM PHOSPHATE 10 MG/ML IJ SOLN
INTRAMUSCULAR | Status: DC | PRN
  Administered 2023-10-03: 10 mg via INTRAVENOUS

## 2023-10-03 MED ORDER — ROCURONIUM BROMIDE 10 MG/ML (PF) SYRINGE
PREFILLED_SYRINGE | INTRAVENOUS | Status: DC | PRN
  Administered 2023-10-03: 50 mg via INTRAVENOUS

## 2023-10-03 MED ORDER — CHLORHEXIDINE GLUCONATE 0.12 % MT SOLN
OROMUCOSAL | Status: AC
  Filled 2023-10-03: qty 15

## 2023-10-03 MED ORDER — 0.9 % SODIUM CHLORIDE (POUR BTL) OPTIME
TOPICAL | Status: DC | PRN
  Administered 2023-10-03: 1000 mL

## 2023-10-03 MED ORDER — ONDANSETRON HCL 4 MG/2ML IJ SOLN: 4.0000 mg | Freq: Once | INTRAMUSCULAR | Status: DC | PRN

## 2023-10-03 MED ORDER — LACTATED RINGERS IV SOLN: INTRAVENOUS | Status: DC | PRN

## 2023-10-03 MED ORDER — GENTAMICIN SULFATE 40 MG/ML IJ SOLN
300.0000 mg | INTRAVENOUS | Status: AC
  Administered 2023-10-03: 300 mg via INTRAVENOUS
  Filled 2023-10-03: qty 7.5

## 2023-10-03 MED ORDER — CHLORHEXIDINE GLUCONATE 0.12 % MT SOLN
15.0000 mL | Freq: Once | OROMUCOSAL | Status: AC
  Administered 2023-10-03: 15 mL via OROMUCOSAL

## 2023-10-03 MED ORDER — SODIUM CHLORIDE 0.9 % IV SOLN
INTRAVENOUS | Status: DC
Start: 2023-10-03 — End: 2023-10-03

## 2023-10-03 MED ORDER — CLINDAMYCIN PHOSPHATE 900 MG/50ML IV SOLN
INTRAVENOUS | Status: AC
  Filled 2023-10-03: qty 50

## 2023-10-03 MED ORDER — ENOXAPARIN SODIUM 40 MG/0.4ML IJ SOSY
40.0000 mg | PREFILLED_SYRINGE | INTRAMUSCULAR | Status: AC
  Administered 2023-10-03: 40 mg via SUBCUTANEOUS

## 2023-10-03 SURGICAL SUPPLY — 41 items
BLADE CLIPPER SENSICLIP SURGIC (BLADE) ×1 IMPLANT
BLADE SURG 15 STRL LF DISP TIS (BLADE) ×1 IMPLANT
COVER MAYO STAND STRL (DRAPES) ×1 IMPLANT
DERMABOND ADVANCED .7 DNX12 (GAUZE/BANDAGES/DRESSINGS) ×1 IMPLANT
ELECTRODE REM PT RTRN 9FT ADLT (ELECTROSURGICAL) IMPLANT
GAUZE 4X4 16PLY ~~LOC~~+RFID DBL (SPONGE) IMPLANT
GLOVE BIOGEL PI IND STRL 6.5 (GLOVE) ×1 IMPLANT
GLOVE ECLIPSE 6.0 STRL STRAW (GLOVE) ×1 IMPLANT
GOWN STRL REUS W/ TWL LRG LVL3 (GOWN DISPOSABLE) ×1 IMPLANT
GOWN STRL REUS W/TWL LRG LVL3 (GOWN DISPOSABLE) ×1 IMPLANT
HIBICLENS CHG 4% 4OZ (MISCELLANEOUS) ×1 IMPLANT
HIBICLENS CHG 4% 4OZ BTL (MISCELLANEOUS) ×1 IMPLANT
HOLDER FOLEY CATH W/STRAP (MISCELLANEOUS) ×1 IMPLANT
KIT TURNOVER KIT B (KITS) ×1 IMPLANT
MANIFOLD NEPTUNE II (INSTRUMENTS) ×1 IMPLANT
NDL HYPO 22X1.5 SAFETY MO (MISCELLANEOUS) ×1 IMPLANT
NEEDLE HYPO 22X1.5 SAFETY MO (MISCELLANEOUS) ×1 IMPLANT
NS IRRIG 1000ML POUR BTL (IV SOLUTION) ×1 IMPLANT
PACK CYSTO (CUSTOM PROCEDURE TRAY) ×1 IMPLANT
PACK VAGINAL WOMENS (CUSTOM PROCEDURE TRAY) ×1 IMPLANT
PAD OB MATERNITY 11 LF (PERSONAL CARE ITEMS) ×1 IMPLANT
RETRACTOR LONE STAR DISPOSABLE (INSTRUMENTS) ×1 IMPLANT
RETRACTOR STAY HOOK 5MM (MISCELLANEOUS) ×1 IMPLANT
SET CYSTO W/LG BORE CLAMP LF (SET/KITS/TRAYS/PACK) ×1 IMPLANT
SET IRRIG Y TYPE TUR BLADDER L (SET/KITS/TRAYS/PACK) ×1 IMPLANT
SLEEVE SCD COMPRESS KNEE MED (STOCKING) ×1 IMPLANT
SLING INCONTINENCE KIM KNTLS (Miscellaneous) IMPLANT
SPIKE FLUID TRANSFER (MISCELLANEOUS) ×1 IMPLANT
SUCTION TUBE FRAZIER 10FR DISP (SUCTIONS) ×1 IMPLANT
SURGIFLO W/THROMBIN 8M KIT (HEMOSTASIS) IMPLANT
SUT MON AB 2-0 SH 27 (SUTURE) IMPLANT
SUT VIC AB 0 CT1 27XBRD ANTBC (SUTURE) ×1 IMPLANT
SUT VIC AB 0 CT1 27XCR 8 STRN (SUTURE) ×1 IMPLANT
SUT VIC AB 0 CT2 27 (SUTURE) IMPLANT
SUT VIC AB 2-0 SH 27XBRD (SUTURE) ×1 IMPLANT
SUT VICRYL 2-0 SH 8X27 (SUTURE) ×1 IMPLANT
SYR BULB EAR ULCER 3OZ GRN STR (SYRINGE) ×1 IMPLANT
TOWEL GREEN STERILE (TOWEL DISPOSABLE) ×1 IMPLANT
TOWEL GREEN STERILE FF (TOWEL DISPOSABLE) ×1 IMPLANT
TRAY FOLEY W/BAG SLVR 14FR (SET/KITS/TRAYS/PACK) ×1 IMPLANT
TRAY FOLEY W/BAG SLVR 14FR LF (SET/KITS/TRAYS/PACK) ×1 IMPLANT

## 2023-10-03 NOTE — Telephone Encounter (Signed)
 Margaret Foley underwent Colpocleisis, levator plication, perineorrhaphy, midurethral sling (KIM sling), cystoscopy on 10/03/23.   Margaret Foley passed her voiding trial.  was backfilled into the bladder Voided - pt was unable to make it to the bathroom PVR by bladder scan was 0ml.   Margaret Foley was discharged without a catheter. Please call her for a routine post op check tomorrow. Thanks!  Arma Lamp, MD

## 2023-10-03 NOTE — Op Note (Signed)
 Operative Note  Preoperative Diagnosis: anterior vaginal prolapse, posterior vaginal prolapse, vaginal vault prolapse after hysterectomy, and stress urinary incontinence  Postoperative Diagnosis: same  Procedures performed:  Colpocleisis, levator plication, perineorrhaphy, midurethral sling (KIM sling), cystoscopy  Implants:  Implant Name Type Inv. Item Serial No. Manufacturer Lot No. LRB No. Used Action  Max Spain - [id number] Miscellaneous Max [employer] [id number] NEOMEDIC  N/A 1 Implanted    Attending Surgeon: Ollen Beverage, MD  Assistant: Kirstie Percy, PA  Anesthesia: General endotracheal  Findings: 1. On vaginal exam, stage IV prolapse present  2. On cystoscopy, normal bladder and urethral mucosa without injury or lesion. Brisk bilateral ureteral efflux present.    Specimens: none  Estimated blood loss: 50 mL  IV fluids: 1157 mL  Urine output: see flowsheet   Complications: none  Procedure in Detail:  After informed consent was obtained, the patient was taken to the operating room where she was placed under anesthesia.  She was then placed in the dorsal lithotomy position, taking care to avoid traction on the extremities. She was then prepped and draped in the usual sterile fashion.  A self-retaining lonestar retractor was placed using four elastic blue stays.  After a foley catheter was inserted into the urethra, the location of the midurethra was palpated. Two Allis clamps were placed at the vaginal apex. 0.5% lidocaine  with epinephrine  was injected into the vaginal mucosa circumferentially.  A marking pen was used to delineate 4 quadrants of vaginal mucosa to remove.  An incision was made with a 15 blade scalpel along these marked lines. After an Allis clamp was placed on the vaginal mucosa, the underlying vaginal muscularis was dissected off the mucosa.  The vaginal mucosa was excised off each quadrant. Excellent hemostasis was  obtained with cautery.  Serial pursestring sutures were used to reduce the vaginal vault prolapse using 2-0 vicryl. Anterior and posterior plication sutures were placed to reapproximate the epithelium. The mucosa was then closed with a 0-vicryl suture in a running fashion.   The retropubic midurethral sling was performed next. Two Allis clamps were placed lateral to the location of the midurethra. 0.5% lidocaine  with epinephrine  was injected into the vaginal mucosa and laterally.  Using a 15 blade scapel a vertical incision was made in the vaginal mucosa. Metzenbaum scissors were used to dissect the vaginal mucosa off of the underlying muscularis and to create the periurethral tunnels. The trocar was introduced into the right side of the vaginal incision, just inferior to the pubic symphysis on the right side. The trocar was guided through the endopelvic fascia and directly behind the pubic symphysis through the retropubic space, vertically.  The trocar was guided out through the suprapubic region, 2 fingerbreadths lateral to midline at the level of the pubic symphysis on the ipsilateral side. The trocar was similarly placed on the left side.  The Foley catheter was removed.  A 70-degree cystoscope was introduced, and 360-degree inspection revealed no trauma or trocars in the bladder, with bilateral ureteral efflux.  The bladder was drained and the cystoscope was removed.  The Foley catheter was reinserted. The sling was attached was brought to lie beneath the mid-urethra.  The sling was placed to just touch the urethra with no tension. The distal ends of the sling were trimmed just below the level of the skin incisions. The skin incisions were closed with dermabond.  Hemostasis was noted.  Tension-free positioning of the sling was confirmed.  The vaginal incision was closed with  2-0 vicryl.  Attention was then turned to the perineum.  Two Allis clamps were placed at the level of the hymenal ring and 0.5%  lidocaine  with epinephrine  was injected into the vaginal mucosa. A diamond shaped incision was made over the perineum with a 15 blade scalpel, and the epithelium was removed.  The underlying tissue was then dissected off of the epithelium bilaterally with Metzenbaum scissors.  0-vicryl was used to plicate the levator muscles to the midline. A 0-Vicryl suture was used to reapproximate the perineal body. A 2-0 vicryl suture was then used to close the epithelium in a subcutaneous and subcuticular fashion.  The vagina was copiously irrigated.  Hemostasis was noted.   The patient tolerated the procedure well.  She was awakened from anesthesia and transferred to the recovery room in stable condition. All counts were correct x 2.    Arma Lamp, MD

## 2023-10-03 NOTE — Anesthesia Preprocedure Evaluation (Addendum)
 Anesthesia Evaluation  Patient identified by MRN, date of birth, ID band Patient awake    Reviewed: Allergy & Precautions, NPO status , Patient's Chart, lab work & pertinent test results  Airway Mallampati: II  TM Distance: >3 FB Neck ROM: Full    Dental  (+) Dental Advisory Given, Edentulous Upper, Poor Dentition, Missing   Pulmonary COPD, Current Smoker and Patient abstained from smoking.   Pulmonary exam normal breath sounds clear to auscultation       Cardiovascular hypertension, + CAD and + Peripheral Vascular Disease  Normal cardiovascular exam+ dysrhythmias (LBBB)  Rhythm:Regular Rate:Normal     Neuro/Psych  PSYCHIATRIC DISORDERS Anxiety Depression     Neuromuscular disease    GI/Hepatic Neg liver ROS, hiatal hernia,GERD  ,,  Endo/Other  Obesity   Renal/GU Renal InsufficiencyRenal disease     Musculoskeletal  (+) Arthritis ,  Fibromyalgia -  Abdominal   Peds  Hematology negative hematology ROS (+)   Anesthesia Other Findings Day of surgery medications reviewed with the patient.  Reproductive/Obstetrics                             Anesthesia Physical Anesthesia Plan  ASA: 3  Anesthesia Plan: General   Post-op Pain Management: Tylenol  PO (pre-op)*   Induction: Intravenous  PONV Risk Score and Plan: 3 and Ondansetron , Dexamethasone  and Treatment may vary due to age or medical condition  Airway Management Planned: Oral ETT  Additional Equipment:   Intra-op Plan:   Post-operative Plan: Extubation in OR  Informed Consent: I have reviewed the patients History and Physical, chart, labs and discussed the procedure including the risks, benefits and alternatives for the proposed anesthesia with the patient or authorized representative who has indicated his/her understanding and acceptance.     Dental advisory given  Plan Discussed with: CRNA  Anesthesia Plan Comments:         Anesthesia Quick Evaluation

## 2023-10-03 NOTE — Interval H&P Note (Signed)
 History and Physical Interval Note:  10/03/2023 9:47 AM  Margaret Foley  has presented today for surgery, with the diagnosis of prolapse of vaginal vault after hysterectomy; stress urinary incontinence.  The various methods of treatment have been discussed with the patient and family. After consideration of risks, benefits and other options for treatment, the patient has consented to  Procedure(s) with comments: COLPOCLEISIS (N/A) COLPORRHAPHY, POSTERIOR, FOR RECTOCELE REPAIR (N/A) - levator plication and perineorrhaphy CREATION, URETHRAL SLING, RETROPUBIC APPROACH, USING POLYPROPYLENE TAPE (N/A) - midurethral sling CYSTOSCOPY (N/A) as a surgical intervention.  The patient's history has been reviewed, patient examined, no change in status, stable for surgery.  I have reviewed the patient's chart and labs.  Questions were answered to the patient's satisfaction.     Arma Lamp

## 2023-10-03 NOTE — Anesthesia Procedure Notes (Signed)
 Procedure Name: Intubation Date/Time: 10/03/2023 10:12 AM  Performed by: Loreda Rodriguez, CRNAPre-anesthesia Checklist: Patient identified, Emergency Drugs available, Suction available and Patient being monitored Patient Re-evaluated:Patient Re-evaluated prior to induction Oxygen Delivery Method: Circle system utilized Preoxygenation: Pre-oxygenation with 100% oxygen Induction Type: IV induction Ventilation: Mask ventilation without difficulty Laryngoscope Size: Mac and 3 Grade View: Grade I Tube type: Oral Tube size: 7.0 mm Number of attempts: 1 Airway Equipment and Method: Stylet and Oral airway Placement Confirmation: ETT inserted through vocal cords under direct vision, positive ETCO2 and breath sounds checked- equal and bilateral Secured at: 22 cm Tube secured with: Tape (@ teeth) Dental Injury: Teeth and Oropharynx as per pre-operative assessment  Comments: Atraumatic

## 2023-10-03 NOTE — Transfer of Care (Signed)
 Immediate Anesthesia Transfer of Care Note  Patient: Margaret Foley  Procedure(s) Performed: COLPOCLEISIS COLPORRHAPHY, POSTERIOR, FOR RECTOCELE REPAIR CREATION, URETHRAL SLING, RETROPUBIC APPROACH, USING POLYPROPYLENE TAPE CYSTOSCOPY  Patient Location: PACU  Anesthesia Type:General  Level of Consciousness: awake and patient cooperative  Airway & Oxygen Therapy: Patient Spontanous Breathing and Patient connected to face mask  Post-op Assessment: Report given to RN and Post -op Vital signs reviewed and stable  Post vital signs: Reviewed and stable  Last Vitals:  Vitals Value Taken Time  BP 137/80 10/03/23 1210  Temp    Pulse 68 10/03/23 1214  Resp 20 10/03/23 1214  SpO2 98 % 10/03/23 1214  Vitals shown include unfiled device data.  Last Pain:  Vitals:   10/03/23 0936  TempSrc: Oral  PainSc: 0-No pain      Patients Stated Pain Goal: 5 (10/03/23 0936)  Complications: No notable events documented.

## 2023-10-03 NOTE — Anesthesia Postprocedure Evaluation (Signed)
 Anesthesia Post Note  Patient: Margaret Foley  Procedure(s) Performed: COLPOCLEISIS COLPORRHAPHY, POSTERIOR, FOR RECTOCELE REPAIR CREATION, URETHRAL SLING, RETROPUBIC APPROACH, USING POLYPROPYLENE TAPE CYSTOSCOPY     Patient location during evaluation: PACU Anesthesia Type: General Level of consciousness: awake and alert Pain management: pain level controlled Vital Signs Assessment: post-procedure vital signs reviewed and stable Respiratory status: spontaneous breathing, nonlabored ventilation and respiratory function stable Cardiovascular status: blood pressure returned to baseline and stable Postop Assessment: no apparent nausea or vomiting Anesthetic complications: no   There were no known notable events for this encounter.  Last Vitals:  Vitals:   10/03/23 1302 10/03/23 1315  BP: (!) 124/55   Pulse: (!) 57 61  Resp: 18 (!) 21  Temp:  (!) 36.4 C  SpO2: 91% 90%    Last Pain:  Vitals:   10/03/23 1315  TempSrc:   PainSc: 0-No pain                 Erin Havers

## 2023-10-03 NOTE — Discharge Instructions (Addendum)

## 2023-10-04 ENCOUNTER — Ambulatory Visit: Payer: BLUE CROSS/BLUE SHIELD | Admitting: Sports Medicine

## 2023-10-04 ENCOUNTER — Encounter (HOSPITAL_COMMUNITY): Payer: Self-pay | Admitting: Obstetrics and Gynecology

## 2023-10-04 NOTE — Telephone Encounter (Signed)
 Margaret Foley  underwent Colpocleisis, Colporrhaphy, Posterior, For Rectocele Repair, Creation, Urethral Sling, Retropubic Approach, Using Polypropylene Tape, and Cystoscopy  on 10/03/2023  with [x] Dr Frutoso Jing [] Dr Aron Lard.  Attempted to reach the patient for a post -op call. NO answer on home number and no answer on cell number.

## 2023-10-14 NOTE — Telephone Encounter (Signed)
 I spoke to patients son Mr Amaral. He has been keeping up with her since surgery. She had some constipation and took stool softeners and stools have been great since then. Mr Giacobbe will have Zafiro call the office as she keeps her phone off the hanger

## 2023-10-14 NOTE — Telephone Encounter (Signed)
 Left message to return call. Home number was busy and left message on the cell phone number.

## 2023-11-11 ENCOUNTER — Telehealth: Payer: Self-pay

## 2023-11-11 NOTE — Telephone Encounter (Signed)
 Copied from CRM [id number]. Topic: Clinical - Medical Advice >> Nov 11, 2023  9:31 AM Zane F wrote: Reason for CRM:   The patient's daughter-in-law, Lacinda, called in and would like to confirm if the patient's PCP still would like to seen the patient on 7/30 if they have not had a visit with Neurology yet. They have seen the referral and will schedule but have not had the appointment.   Please call Sydney at [phone] for clarification.  Spoke with patient daughter in law to let her know that it is a three months follow-up with Sherlynn Madden, MD

## 2023-11-15 ENCOUNTER — Encounter: Payer: Self-pay | Admitting: Obstetrics and Gynecology

## 2023-11-15 ENCOUNTER — Ambulatory Visit (INDEPENDENT_AMBULATORY_CARE_PROVIDER_SITE_OTHER): Admitting: Obstetrics and Gynecology

## 2023-11-15 VITALS — BP 149/73 | HR 66

## 2023-11-15 DIAGNOSIS — Z48816 Encounter for surgical aftercare following surgery on the genitourinary system: Secondary | ICD-10-CM

## 2023-11-15 DIAGNOSIS — Z9889 Other specified postprocedural states: Secondary | ICD-10-CM

## 2023-11-15 NOTE — Progress Notes (Signed)
 [facility]  Date of Visit: 11/15/2023  History of Present Illness: Margaret Foley is a 89 y.o. female scheduled today for a post-operative visit.   Surgery: s/p Colpocleisis, levator plication, perineorrhaphy, midurethral sling (KIM sling), cystoscopy on 10/03/23  She passed her postoperative void trial.   Postoperative course has been uncomplicated.   Today she reports she is feeling well.  She reports some urinary urgency but states she is urinating about 3 times per day. Using 2-3 pads per day. Denies any burning with urination.   UTI in the last 6 weeks? No  Pain? No  She has returned to her normal activity (except for postop restrictions) Vaginal bulge? No  Stress incontinence: No  Urgency/frequency: No  Urge incontinence: Yes  Voiding dysfunction: Yes  Bowel issues: No   Subjective Success: Do you usually have a bulge or something falling out that you can see or feel in the vaginal area? No  Retreatment Success: Any retreatment with surgery or pessary for any compartment? No    Medications: She has a current medication list which includes the following prescription(s): acetaminophen , aspirin  ec, and rosuvastatin .   Allergies: Patient is allergic to penicillins, aleve [naproxen], benazepril hcl, carvedilol, indomethacin, pravastatin sodium, rosuvastatin , simvastatin, tramadol, and advil [ibuprofen].   Physical Exam: BP (!) 149/73   Pulse 66   Pelvic Examination: Vagina: perineal incision with sutures, slight separation. Vaginal incisions healing well. Sutures are not present at incision line and there is not granulation tissue. No tenderness along the anterior or posterior vagina. No apical tenderness. No pelvic masses. No visible or palpable mesh.  POP-Q: POP-Q  -2                                            Aa   -2                                           Ba  -2                                              C   1.5                                             Gh  4                                            Pb  2                                            tvl   -2  Ap  -2                                            Bp                                                 D    ---------------------------------------------------------  Assessment and Plan: No diagnosis found.  - Overall well healed but would like to reexamine perineal incision in a month - Family will try to have her keep a diary of voiding and pad use. Suspect she is having some overactive bladder. Advised to reduce tea intake. Offered a trial of OAB medication but patient declines at this time. - Can resume regular activity including exercise. Discussed avoidance of heavy lifting and straining long term to reduce the risk of recurrence.    Return in about 1 month (around 12/16/2023).  Margaret LOISE Caper, MD

## 2023-12-13 ENCOUNTER — Encounter: Admitting: Obstetrics and Gynecology

## 2023-12-14 ENCOUNTER — Other Ambulatory Visit: Payer: Self-pay | Admitting: Internal Medicine

## 2023-12-14 ENCOUNTER — Encounter: Payer: Self-pay | Admitting: Sports Medicine

## 2023-12-14 ENCOUNTER — Ambulatory Visit (INDEPENDENT_AMBULATORY_CARE_PROVIDER_SITE_OTHER): Admitting: Sports Medicine

## 2023-12-14 ENCOUNTER — Other Ambulatory Visit: Payer: Self-pay | Admitting: Obstetrics and Gynecology

## 2023-12-14 VITALS — BP 140/86 | HR 67 | Temp 97.5°F | Resp 10 | Ht 62.0 in | Wt 162.8 lb

## 2023-12-14 DIAGNOSIS — Z0110 Encounter for examination of ears and hearing without abnormal findings: Secondary | ICD-10-CM

## 2023-12-14 DIAGNOSIS — R2689 Other abnormalities of gait and mobility: Secondary | ICD-10-CM

## 2023-12-14 DIAGNOSIS — M1990 Unspecified osteoarthritis, unspecified site: Secondary | ICD-10-CM

## 2023-12-14 DIAGNOSIS — R413 Other amnesia: Secondary | ICD-10-CM | POA: Diagnosis not present

## 2023-12-14 DIAGNOSIS — R32 Unspecified urinary incontinence: Secondary | ICD-10-CM | POA: Diagnosis not present

## 2023-12-14 MED ORDER — MIRABEGRON ER 25 MG PO TB24: 25.0000 mg | ORAL_TABLET | Freq: Every day | ORAL | 0 refills | Status: AC

## 2023-12-14 NOTE — Progress Notes (Signed)
 Careteam: Patient Care Team: Sherlynn Madden, MD as PCP - General (Internal Medicine) Anderson Maude ORN, MD (Inactive) (Orthopedic Surgery) Delford Maude BROCKS, MD (Cardiology) Fate Morna SAILOR, [facility] (Inactive) as Pharmacist (Pharmacist)  PLACE OF SERVICE:  [facility]  Advanced Directive information    Allergies  Allergen Reactions   Penicillins Rash    Has patient had a PCN reaction causing immediate rash, facial/tongue/throat swelling, SOB or lightheadedness with hypotension: Yes Has patient had a PCN reaction causing severe rash involving mucus membranes or skin necrosis: No Has patient had a PCN reaction that required hospitalization No Has patient had a PCN reaction occurring within the last 10 years: No If all of the above answers are NO, then may proceed with Cephalosporin use.    Aleve [Naproxen] Other (See Comments)    Caused scar tissue    Benazepril Hcl     REACTION: cough   Carvedilol     REACTION: ear buzzing ??   Indomethacin    Pravastatin Sodium     REACTION: achy   Rosuvastatin      REACTION: buzzing   Simvastatin     REACTION: myalgia   Tramadol     Ears ringing?   Advil [Ibuprofen] Rash    Chief Complaint  Patient presents with   Medical Management of Chronic Issues    3 month follow up // having a hard time hearing. , urinary frequency , memory issues     Discussed the use of AI scribe software for clinical note transcription with the patient, who gave verbal consent to proceed.  History of Present Illness  Margaret Foley is an 89 year old female who presents with urinary incontinence following a recent vaginal closure procedure. She is accompanied by her daughter-in-law.  She has experienced constant urinary incontinence since undergoing a vaginal closure procedure, with a persistent need to urinate, necessitating the use of sanitary pads continuously. However, daughter states that this is an ongoing problem and had seen  urogynecology yesterday when patient denied urinary incontinence.     Her current medications include a cholesterol medication and a baby aspirin . She has a history of high blood pressure but has been reluctant to start antihypertensive medication.  She lives with her younger son, who assists with meals and is present in the afternoons and evenings. She reports feeling off balance and has come close to falling but has not experienced any falls recently. She does not use a walker and has not engaged in physical therapy recently, although she has done so in the past following a shoulder and hip replacement.  No chest pain, breathing difficulties, or abdominal pain. Occasional dizziness and difficulty maintaining balance, especially when getting up from a seated position, are reported. No joint pain is noted, but there is difficulty with balance when standing from a kneeling position.    Review of Systems:  Review of Systems  Constitutional:  Negative for fever.  HENT:  Negative for sore throat.   Eyes:  Negative for double vision.  Respiratory:  Negative for cough, sputum production and shortness of breath.   Cardiovascular:  Negative for chest pain, palpitations and leg swelling.  Gastrointestinal:  Negative for abdominal pain, heartburn and nausea.  Genitourinary:  Negative for dysuria, frequency and hematuria.  Musculoskeletal:  Negative for falls and myalgias.  Neurological:  Negative for dizziness.   Negative unless indicated in HPI.   Past Medical History:  Diagnosis Date   Anxiety    Atrial fibrillation (HCC)    Barrett's  esophagus    CAD (coronary artery disease)    Cancer (HCC)    Pre cancerous Uterine Tumor   Carotid artery occlusion    Chest pain    atypical normal myovue 2008   COPD (chronic obstructive pulmonary disease) (HCC)    Cough    Depression    Fibromyalgia    GERD (gastroesophageal reflux disease)    Heart murmur    av sclerosis and mild ar echo 2008    Hip pain    History of hiatal hernia    Hyperlipidemia    Hypertension    Insomnia, unspecified    Kidney stone    Lumbago    Myalgia and myositis, unspecified    statin intolerant   Occlusion and stenosis of vertebral artery without mention of cerebral infarction    Osteoarthritis    Osteopenia    Other B-complex deficiencies    Peripheral artery disease (HCC) 12/02/2016   Peripheral vascular disease (HCC)    Shortness of breath dyspnea    Tobacco use disorder    Unspecified vitamin D  deficiency    Past Surgical History:  Procedure Laterality Date   ABDOMINAL AORTIC ANEURYSM REPAIR N/A 05/02/2015   Procedure: REPAIR ABDOMINAL AORTIC ANEURYSM  ,AORTA BI-ILIAC;  Surgeon: Gaile LELON New, MD;  Location: [facility] OR;  Service: Vascular;  Laterality: N/A;   ABDOMINAL HYSTERECTOMY  1986   Complete   BLADDER SUSPENSION N/A 10/03/2023   Procedure: CREATION, URETHRAL SLING, RETROPUBIC APPROACH, USING POLYPROPYLENE TAPE;  Surgeon: Marilynne Rosaline SAILOR, MD;  Location: [facility] OR;  Service: Gynecology;  Laterality: N/A;  midurethral sling   cataract surgery  2011   COLONOSCOPY     COLPOCLEISIS N/A 10/03/2023   Procedure: COLPOCLEISIS;  Surgeon: Marilynne Rosaline SAILOR, MD;  Location: [facility] OR;  Service: Gynecology;  Laterality: N/A;   CYSTOSCOPY N/A 10/03/2023   Procedure: CYSTOSCOPY;  Surgeon: Marilynne Rosaline SAILOR, MD;  Location: [facility] OR;  Service: Gynecology;  Laterality: N/A;   JOINT REPLACEMENT  2012   Right Shoulder and arm   RECTOCELE REPAIR N/A 10/03/2023   Procedure: COLPORRHAPHY, POSTERIOR, FOR RECTOCELE REPAIR;  Surgeon: Marilynne Rosaline SAILOR, MD;  Location: [facility] OR;  Service: Gynecology;  Laterality: N/A;  levator plication and perineorrhaphy   Social History:   reports that she has been smoking cigarettes. She has a 14.3 pack-year smoking history. She has never used smokeless tobacco. She reports that she does not drink alcohol and does not use drugs.  Family History  Problem Relation Age of  Onset   Arthritis Mother    Heart disease Mother    Hypertension Mother    Heart attack Mother    Hyperlipidemia Other    Heart disease Son    Hypertension Son    Cancer Father    Hyperlipidemia Sister    Cancer Brother    Colon cancer Neg Hx     Medications: Patient's Medications  New Prescriptions   No medications on file  Previous Medications   ACETAMINOPHEN  (TYLENOL ) 500 MG TABLET    Take 1 tablet (500 mg total) by mouth every 6 (six) hours as needed (pain).   ASPIRIN  EC 81 MG TABLET    Take 81 mg by mouth daily. Swallow whole.  NOT TAKING   ROSUVASTATIN  (CRESTOR ) 10 MG TABLET    Take 10 mg by mouth daily.  Modified Medications   No medications on file  Discontinued Medications   No medications on file    Physical Exam: Vitals:   12/14/23 1101  12/14/23 1105  BP: (!) 142/86 (!) 140/86  Pulse: 67   Resp: 10   Temp: (!) 97.5 F (36.4 C)   SpO2: 95%   Weight: 162 lb 12.8 oz (73.8 kg)   Height: 5' 2 (1.575 m)    Body mass index is 29.78 kg/m. BP Readings from Last 3 Encounters:  12/14/23 (!) 140/86  11/15/23 (!) 149/73  10/03/23 104/69   Wt Readings from Last 3 Encounters:  12/14/23 162 lb 12.8 oz (73.8 kg)  10/03/23 180 lb (81.6 kg)  09/14/23 168 lb 12.8 oz (76.6 kg)    Physical Exam Constitutional:      Appearance: Normal appearance.  HENT:     Head: Normocephalic and atraumatic.  Cardiovascular:     Rate and Rhythm: Normal rate and regular rhythm.  Pulmonary:     Effort: Pulmonary effort is normal. No respiratory distress.     Breath sounds: Normal breath sounds. No wheezing.  Abdominal:     General: Bowel sounds are normal. There is no distension.     Tenderness: There is no abdominal tenderness. There is no guarding or rebound.     Comments:    Musculoskeletal:        General: No swelling or tenderness.  Neurological:     Mental Status: She is alert. Mental status is at baseline.     Motor: No weakness.     Labs reviewed: Basic  Metabolic Panel: Recent Labs    06/28/23 1507 08/04/23 2215 10/03/23 0930  NA 143 137 141  K 4.4 3.9 4.2  CL 106 107 109  CO2 28 22 24   GLUCOSE 92 142* 102*  BUN 21 20 16   CREATININE 1.16* 1.61* 1.08*  CALCIUM  9.8 9.3 9.1  TSH 1.67  --   --    Liver Function Tests: Recent Labs    08/04/23 2215  AST 30  ALT 15  ALKPHOS 72  BILITOT 0.5  PROT 6.7  ALBUMIN  3.8   Recent Labs    08/04/23 2215  LIPASE 44   No results for input(s): AMMONIA in the last 8760 hours. CBC: Recent Labs    06/28/23 1507 08/04/23 2215  WBC 6.6 6.5  HGB 14.7 14.8  HCT 44.3 44.9  MCV 89.0 90.0  PLT 217 211   Lipid Panel: No results for input(s): CHOL, HDL, LDLCALC, TRIG, CHOLHDL, LDLDIRECT in the last 8760 hours. TSH: Recent Labs    06/28/23 1507  TSH 1.67   A1C: Lab Results  Component Value Date   HGBA1C 5.7 10/04/2022    Assessment and Plan Assessment & Plan   1. Urinary incontinence, unspecified type (Primary)  Will start myrbetriq  Will avoid vesicare due to underlying dementia Might benefit for pelvic floor rehab Follow up with urogynecology - mirabegron  ER (MYRBETRIQ ) 25 MG TB24 tablet; Take 1 tablet (25 mg total) by mouth daily.  Dispense: 90 tablet; Refill: 0  2. Balance problem Osteoarthritis, unspecified osteoarthritis type, unspecified site Pt reports problems with her balance  No recent falls Will refer to home health for PT for muscle strengthening exercises   - Ambulatory referral to Home Health    3. Memory deficits  Increase cognitively engaging activities and physical activity      5. Evaluation of hearing impairment - Ambulatory referral to Audiology

## 2023-12-16 ENCOUNTER — Encounter: Payer: Self-pay | Admitting: Obstetrics and Gynecology

## 2023-12-19 ENCOUNTER — Encounter: Payer: Self-pay | Admitting: Sports Medicine

## 2023-12-19 NOTE — Telephone Encounter (Signed)
Noted   Thanks for the info

## 2023-12-23 DIAGNOSIS — R32 Unspecified urinary incontinence: Secondary | ICD-10-CM | POA: Diagnosis not present

## 2023-12-23 DIAGNOSIS — H9190 Unspecified hearing loss, unspecified ear: Secondary | ICD-10-CM | POA: Diagnosis not present

## 2023-12-23 DIAGNOSIS — M810 Age-related osteoporosis without current pathological fracture: Secondary | ICD-10-CM | POA: Diagnosis not present

## 2023-12-23 DIAGNOSIS — N189 Chronic kidney disease, unspecified: Secondary | ICD-10-CM | POA: Diagnosis not present

## 2023-12-23 DIAGNOSIS — M159 Polyosteoarthritis, unspecified: Secondary | ICD-10-CM | POA: Diagnosis not present

## 2023-12-23 DIAGNOSIS — I129 Hypertensive chronic kidney disease with stage 1 through stage 4 chronic kidney disease, or unspecified chronic kidney disease: Secondary | ICD-10-CM | POA: Diagnosis not present

## 2023-12-23 DIAGNOSIS — I480 Paroxysmal atrial fibrillation: Secondary | ICD-10-CM | POA: Diagnosis not present

## 2023-12-23 DIAGNOSIS — R413 Other amnesia: Secondary | ICD-10-CM | POA: Diagnosis not present

## 2023-12-23 DIAGNOSIS — I2510 Atherosclerotic heart disease of native coronary artery without angina pectoris: Secondary | ICD-10-CM | POA: Diagnosis not present

## 2024-01-04 DIAGNOSIS — J449 Chronic obstructive pulmonary disease, unspecified: Secondary | ICD-10-CM

## 2024-01-04 DIAGNOSIS — F419 Anxiety disorder, unspecified: Secondary | ICD-10-CM

## 2024-01-04 DIAGNOSIS — I2510 Atherosclerotic heart disease of native coronary artery without angina pectoris: Secondary | ICD-10-CM | POA: Diagnosis not present

## 2024-01-04 DIAGNOSIS — M797 Fibromyalgia: Secondary | ICD-10-CM

## 2024-01-04 DIAGNOSIS — R32 Unspecified urinary incontinence: Secondary | ICD-10-CM | POA: Diagnosis not present

## 2024-01-04 DIAGNOSIS — Z87891 Personal history of nicotine dependence: Secondary | ICD-10-CM

## 2024-01-04 DIAGNOSIS — B356 Tinea cruris: Secondary | ICD-10-CM

## 2024-01-04 DIAGNOSIS — K2270 Barrett's esophagus without dysplasia: Secondary | ICD-10-CM

## 2024-01-04 DIAGNOSIS — M810 Age-related osteoporosis without current pathological fracture: Secondary | ICD-10-CM | POA: Diagnosis not present

## 2024-01-04 DIAGNOSIS — K449 Diaphragmatic hernia without obstruction or gangrene: Secondary | ICD-10-CM

## 2024-01-04 DIAGNOSIS — E538 Deficiency of other specified B group vitamins: Secondary | ICD-10-CM

## 2024-01-04 DIAGNOSIS — F32A Depression, unspecified: Secondary | ICD-10-CM

## 2024-01-04 DIAGNOSIS — N189 Chronic kidney disease, unspecified: Secondary | ICD-10-CM | POA: Diagnosis not present

## 2024-01-04 DIAGNOSIS — Z90710 Acquired absence of both cervix and uterus: Secondary | ICD-10-CM

## 2024-01-04 DIAGNOSIS — I447 Left bundle-branch block, unspecified: Secondary | ICD-10-CM

## 2024-01-04 DIAGNOSIS — R413 Other amnesia: Secondary | ICD-10-CM | POA: Diagnosis not present

## 2024-01-04 DIAGNOSIS — Z9181 History of falling: Secondary | ICD-10-CM

## 2024-01-04 DIAGNOSIS — M159 Polyosteoarthritis, unspecified: Secondary | ICD-10-CM | POA: Diagnosis not present

## 2024-01-04 DIAGNOSIS — Z96611 Presence of right artificial shoulder joint: Secondary | ICD-10-CM

## 2024-01-04 DIAGNOSIS — Z5982 Transportation insecurity: Secondary | ICD-10-CM

## 2024-01-04 DIAGNOSIS — I129 Hypertensive chronic kidney disease with stage 1 through stage 4 chronic kidney disease, or unspecified chronic kidney disease: Secondary | ICD-10-CM | POA: Diagnosis not present

## 2024-01-04 DIAGNOSIS — E785 Hyperlipidemia, unspecified: Secondary | ICD-10-CM

## 2024-01-04 DIAGNOSIS — H9190 Unspecified hearing loss, unspecified ear: Secondary | ICD-10-CM | POA: Diagnosis not present

## 2024-01-04 DIAGNOSIS — G4700 Insomnia, unspecified: Secondary | ICD-10-CM

## 2024-01-04 DIAGNOSIS — Z96649 Presence of unspecified artificial hip joint: Secondary | ICD-10-CM

## 2024-01-04 DIAGNOSIS — I739 Peripheral vascular disease, unspecified: Secondary | ICD-10-CM

## 2024-01-04 DIAGNOSIS — I480 Paroxysmal atrial fibrillation: Secondary | ICD-10-CM | POA: Diagnosis not present

## 2024-01-04 DIAGNOSIS — K219 Gastro-esophageal reflux disease without esophagitis: Secondary | ICD-10-CM

## 2024-01-06 DIAGNOSIS — M810 Age-related osteoporosis without current pathological fracture: Secondary | ICD-10-CM | POA: Diagnosis not present

## 2024-01-06 DIAGNOSIS — I129 Hypertensive chronic kidney disease with stage 1 through stage 4 chronic kidney disease, or unspecified chronic kidney disease: Secondary | ICD-10-CM | POA: Diagnosis not present

## 2024-01-06 DIAGNOSIS — R413 Other amnesia: Secondary | ICD-10-CM | POA: Diagnosis not present

## 2024-01-06 DIAGNOSIS — M159 Polyosteoarthritis, unspecified: Secondary | ICD-10-CM | POA: Diagnosis not present

## 2024-01-06 DIAGNOSIS — N189 Chronic kidney disease, unspecified: Secondary | ICD-10-CM | POA: Diagnosis not present

## 2024-01-06 DIAGNOSIS — R32 Unspecified urinary incontinence: Secondary | ICD-10-CM | POA: Diagnosis not present

## 2024-01-06 DIAGNOSIS — H9190 Unspecified hearing loss, unspecified ear: Secondary | ICD-10-CM | POA: Diagnosis not present

## 2024-01-06 DIAGNOSIS — I2510 Atherosclerotic heart disease of native coronary artery without angina pectoris: Secondary | ICD-10-CM | POA: Diagnosis not present

## 2024-01-06 DIAGNOSIS — I480 Paroxysmal atrial fibrillation: Secondary | ICD-10-CM | POA: Diagnosis not present

## 2024-01-11 DIAGNOSIS — F03A0 Unspecified dementia, mild, without behavioral disturbance, psychotic disturbance, mood disturbance, and anxiety: Secondary | ICD-10-CM | POA: Diagnosis not present

## 2024-01-13 DIAGNOSIS — M810 Age-related osteoporosis without current pathological fracture: Secondary | ICD-10-CM | POA: Diagnosis not present

## 2024-01-13 DIAGNOSIS — I129 Hypertensive chronic kidney disease with stage 1 through stage 4 chronic kidney disease, or unspecified chronic kidney disease: Secondary | ICD-10-CM | POA: Diagnosis not present

## 2024-01-13 DIAGNOSIS — I480 Paroxysmal atrial fibrillation: Secondary | ICD-10-CM | POA: Diagnosis not present

## 2024-01-13 DIAGNOSIS — R32 Unspecified urinary incontinence: Secondary | ICD-10-CM | POA: Diagnosis not present

## 2024-01-13 DIAGNOSIS — R413 Other amnesia: Secondary | ICD-10-CM | POA: Diagnosis not present

## 2024-01-13 DIAGNOSIS — H9190 Unspecified hearing loss, unspecified ear: Secondary | ICD-10-CM | POA: Diagnosis not present

## 2024-01-13 DIAGNOSIS — N189 Chronic kidney disease, unspecified: Secondary | ICD-10-CM | POA: Diagnosis not present

## 2024-01-13 DIAGNOSIS — M159 Polyosteoarthritis, unspecified: Secondary | ICD-10-CM | POA: Diagnosis not present

## 2024-01-13 DIAGNOSIS — I2510 Atherosclerotic heart disease of native coronary artery without angina pectoris: Secondary | ICD-10-CM | POA: Diagnosis not present

## 2024-01-20 DIAGNOSIS — I2510 Atherosclerotic heart disease of native coronary artery without angina pectoris: Secondary | ICD-10-CM | POA: Diagnosis not present

## 2024-01-20 DIAGNOSIS — H9190 Unspecified hearing loss, unspecified ear: Secondary | ICD-10-CM | POA: Diagnosis not present

## 2024-01-20 DIAGNOSIS — M159 Polyosteoarthritis, unspecified: Secondary | ICD-10-CM | POA: Diagnosis not present

## 2024-01-20 DIAGNOSIS — R32 Unspecified urinary incontinence: Secondary | ICD-10-CM | POA: Diagnosis not present

## 2024-01-20 DIAGNOSIS — M810 Age-related osteoporosis without current pathological fracture: Secondary | ICD-10-CM | POA: Diagnosis not present

## 2024-01-20 DIAGNOSIS — I129 Hypertensive chronic kidney disease with stage 1 through stage 4 chronic kidney disease, or unspecified chronic kidney disease: Secondary | ICD-10-CM | POA: Diagnosis not present

## 2024-01-20 DIAGNOSIS — N189 Chronic kidney disease, unspecified: Secondary | ICD-10-CM | POA: Diagnosis not present

## 2024-01-20 DIAGNOSIS — R413 Other amnesia: Secondary | ICD-10-CM | POA: Diagnosis not present

## 2024-01-20 DIAGNOSIS — I480 Paroxysmal atrial fibrillation: Secondary | ICD-10-CM | POA: Diagnosis not present

## 2024-01-24 DIAGNOSIS — F0390 Unspecified dementia without behavioral disturbance: Secondary | ICD-10-CM | POA: Diagnosis not present

## 2024-01-28 ENCOUNTER — Other Ambulatory Visit: Payer: Self-pay | Admitting: Sports Medicine

## 2024-01-30 NOTE — Telephone Encounter (Signed)
 Medication has never been prescribed by patients pcp Sherlynn Madden, MD

## 2024-02-03 DIAGNOSIS — H9190 Unspecified hearing loss, unspecified ear: Secondary | ICD-10-CM | POA: Diagnosis not present

## 2024-02-03 DIAGNOSIS — R32 Unspecified urinary incontinence: Secondary | ICD-10-CM | POA: Diagnosis not present

## 2024-02-03 DIAGNOSIS — I480 Paroxysmal atrial fibrillation: Secondary | ICD-10-CM | POA: Diagnosis not present

## 2024-02-03 DIAGNOSIS — M810 Age-related osteoporosis without current pathological fracture: Secondary | ICD-10-CM | POA: Diagnosis not present

## 2024-02-03 DIAGNOSIS — I129 Hypertensive chronic kidney disease with stage 1 through stage 4 chronic kidney disease, or unspecified chronic kidney disease: Secondary | ICD-10-CM | POA: Diagnosis not present

## 2024-02-03 DIAGNOSIS — R413 Other amnesia: Secondary | ICD-10-CM | POA: Diagnosis not present

## 2024-02-03 DIAGNOSIS — I2510 Atherosclerotic heart disease of native coronary artery without angina pectoris: Secondary | ICD-10-CM | POA: Diagnosis not present

## 2024-02-03 DIAGNOSIS — N189 Chronic kidney disease, unspecified: Secondary | ICD-10-CM | POA: Diagnosis not present

## 2024-02-03 DIAGNOSIS — M159 Polyosteoarthritis, unspecified: Secondary | ICD-10-CM | POA: Diagnosis not present

## 2024-02-10 DIAGNOSIS — R413 Other amnesia: Secondary | ICD-10-CM | POA: Diagnosis not present

## 2024-02-10 DIAGNOSIS — I480 Paroxysmal atrial fibrillation: Secondary | ICD-10-CM | POA: Diagnosis not present

## 2024-02-10 DIAGNOSIS — N189 Chronic kidney disease, unspecified: Secondary | ICD-10-CM | POA: Diagnosis not present

## 2024-02-10 DIAGNOSIS — I2510 Atherosclerotic heart disease of native coronary artery without angina pectoris: Secondary | ICD-10-CM | POA: Diagnosis not present

## 2024-02-10 DIAGNOSIS — M810 Age-related osteoporosis without current pathological fracture: Secondary | ICD-10-CM | POA: Diagnosis not present

## 2024-02-10 DIAGNOSIS — R32 Unspecified urinary incontinence: Secondary | ICD-10-CM | POA: Diagnosis not present

## 2024-02-10 DIAGNOSIS — H9190 Unspecified hearing loss, unspecified ear: Secondary | ICD-10-CM | POA: Diagnosis not present

## 2024-02-10 DIAGNOSIS — I129 Hypertensive chronic kidney disease with stage 1 through stage 4 chronic kidney disease, or unspecified chronic kidney disease: Secondary | ICD-10-CM | POA: Diagnosis not present

## 2024-02-10 DIAGNOSIS — M159 Polyosteoarthritis, unspecified: Secondary | ICD-10-CM | POA: Diagnosis not present

## 2024-02-16 DIAGNOSIS — F0390 Unspecified dementia without behavioral disturbance: Secondary | ICD-10-CM | POA: Diagnosis not present

## 2024-03-07 ENCOUNTER — Encounter: Payer: Self-pay | Admitting: Sports Medicine

## 2024-03-08 NOTE — Telephone Encounter (Signed)
 Darice or Alondra please follow up with patient

## 2024-04-17 ENCOUNTER — Ambulatory Visit: Admitting: Sports Medicine
